# Patient Record
Sex: Female | Born: 1945 | Race: White | Hispanic: No | Marital: Married | Smoking: Former smoker
Health system: Southern US, Community
[De-identification: ages and names within clinical notes are randomized; demographics above are authoritative.]

## PROBLEM LIST (undated history)

## (undated) DIAGNOSIS — K589 Irritable bowel syndrome without diarrhea: Secondary | ICD-10-CM

## (undated) DIAGNOSIS — I2510 Atherosclerotic heart disease of native coronary artery without angina pectoris: Secondary | ICD-10-CM

## (undated) DIAGNOSIS — I5189 Other ill-defined heart diseases: Secondary | ICD-10-CM

## (undated) DIAGNOSIS — I10 Essential (primary) hypertension: Secondary | ICD-10-CM

## (undated) DIAGNOSIS — I214 Non-ST elevation (NSTEMI) myocardial infarction: Secondary | ICD-10-CM

## (undated) DIAGNOSIS — I2699 Other pulmonary embolism without acute cor pulmonale: Secondary | ICD-10-CM

## (undated) DIAGNOSIS — I519 Heart disease, unspecified: Secondary | ICD-10-CM

## (undated) DIAGNOSIS — I5181 Takotsubo syndrome: Secondary | ICD-10-CM

## (undated) DIAGNOSIS — M797 Fibromyalgia: Secondary | ICD-10-CM

## (undated) DIAGNOSIS — K5792 Diverticulitis of intestine, part unspecified, without perforation or abscess without bleeding: Secondary | ICD-10-CM

## (undated) DIAGNOSIS — E785 Hyperlipidemia, unspecified: Secondary | ICD-10-CM

## (undated) HISTORY — DX: Atherosclerotic heart disease of native coronary artery without angina pectoris: I25.10

## (undated) HISTORY — DX: Takotsubo syndrome: I51.81

## (undated) HISTORY — PX: STOMACH SURGERY: SHX791

## (undated) HISTORY — DX: Hyperlipidemia, unspecified: E78.5

## (undated) HISTORY — PX: ABDOMINAL HYSTERECTOMY: SHX81

---

## 1998-01-23 ENCOUNTER — Emergency Department (HOSPITAL_COMMUNITY): Admission: EM | Admit: 1998-01-23 | Discharge: 1998-01-23 | Payer: Self-pay | Admitting: Emergency Medicine

## 1998-07-29 ENCOUNTER — Emergency Department (HOSPITAL_COMMUNITY): Admission: EM | Admit: 1998-07-29 | Discharge: 1998-07-29 | Payer: Self-pay | Admitting: Emergency Medicine

## 1998-08-02 ENCOUNTER — Emergency Department (HOSPITAL_COMMUNITY): Admission: EM | Admit: 1998-08-02 | Discharge: 1998-08-02 | Payer: Self-pay | Admitting: Emergency Medicine

## 1998-09-28 ENCOUNTER — Emergency Department (HOSPITAL_COMMUNITY): Admission: EM | Admit: 1998-09-28 | Discharge: 1998-09-29 | Payer: Self-pay | Admitting: Emergency Medicine

## 1998-09-28 ENCOUNTER — Encounter: Payer: Self-pay | Admitting: Emergency Medicine

## 1998-09-29 ENCOUNTER — Encounter: Payer: Self-pay | Admitting: Emergency Medicine

## 1999-04-16 ENCOUNTER — Ambulatory Visit (HOSPITAL_COMMUNITY): Admission: RE | Admit: 1999-04-16 | Discharge: 1999-04-16 | Payer: Self-pay | Admitting: Emergency Medicine

## 1999-05-14 ENCOUNTER — Emergency Department (HOSPITAL_COMMUNITY): Admission: EM | Admit: 1999-05-14 | Discharge: 1999-05-14 | Payer: Self-pay | Admitting: Emergency Medicine

## 1999-05-14 ENCOUNTER — Encounter: Payer: Self-pay | Admitting: Emergency Medicine

## 1999-07-16 ENCOUNTER — Ambulatory Visit (HOSPITAL_COMMUNITY): Admission: RE | Admit: 1999-07-16 | Discharge: 1999-07-16 | Payer: Self-pay | Admitting: Gastroenterology

## 1999-07-16 ENCOUNTER — Encounter (INDEPENDENT_AMBULATORY_CARE_PROVIDER_SITE_OTHER): Payer: Self-pay | Admitting: Specialist

## 1999-07-17 ENCOUNTER — Encounter: Payer: Self-pay | Admitting: Gastroenterology

## 1999-07-17 ENCOUNTER — Ambulatory Visit (HOSPITAL_COMMUNITY): Admission: RE | Admit: 1999-07-17 | Discharge: 1999-07-17 | Payer: Self-pay | Admitting: Gastroenterology

## 2000-05-02 ENCOUNTER — Ambulatory Visit (HOSPITAL_COMMUNITY): Admission: RE | Admit: 2000-05-02 | Discharge: 2000-05-02 | Payer: Self-pay | Admitting: *Deleted

## 2000-09-02 ENCOUNTER — Ambulatory Visit (HOSPITAL_COMMUNITY): Admission: RE | Admit: 2000-09-02 | Discharge: 2000-09-02 | Payer: Self-pay | Admitting: Specialist

## 2001-02-07 ENCOUNTER — Emergency Department (HOSPITAL_COMMUNITY): Admission: EM | Admit: 2001-02-07 | Discharge: 2001-02-07 | Payer: Self-pay | Admitting: Emergency Medicine

## 2001-02-10 ENCOUNTER — Ambulatory Visit (HOSPITAL_COMMUNITY): Admission: RE | Admit: 2001-02-10 | Discharge: 2001-02-10 | Payer: Self-pay | Admitting: Emergency Medicine

## 2001-02-10 ENCOUNTER — Encounter: Payer: Self-pay | Admitting: Emergency Medicine

## 2001-02-14 ENCOUNTER — Emergency Department (HOSPITAL_COMMUNITY): Admission: EM | Admit: 2001-02-14 | Discharge: 2001-02-14 | Payer: Self-pay | Admitting: Emergency Medicine

## 2001-05-24 ENCOUNTER — Emergency Department (HOSPITAL_COMMUNITY): Admission: EM | Admit: 2001-05-24 | Discharge: 2001-05-24 | Payer: Self-pay | Admitting: Emergency Medicine

## 2001-08-19 ENCOUNTER — Encounter: Admission: RE | Admit: 2001-08-19 | Discharge: 2001-08-19 | Payer: Self-pay | Admitting: *Deleted

## 2001-09-08 ENCOUNTER — Encounter: Payer: Self-pay | Admitting: Emergency Medicine

## 2001-09-08 ENCOUNTER — Emergency Department (HOSPITAL_COMMUNITY): Admission: EM | Admit: 2001-09-08 | Discharge: 2001-09-09 | Payer: Self-pay | Admitting: Emergency Medicine

## 2001-09-09 ENCOUNTER — Encounter: Payer: Self-pay | Admitting: Emergency Medicine

## 2002-06-07 ENCOUNTER — Encounter: Admission: RE | Admit: 2002-06-07 | Discharge: 2002-06-07 | Payer: Self-pay | Admitting: *Deleted

## 2003-04-23 ENCOUNTER — Emergency Department (HOSPITAL_COMMUNITY): Admission: EM | Admit: 2003-04-23 | Discharge: 2003-04-23 | Payer: Self-pay | Admitting: Emergency Medicine

## 2003-04-23 ENCOUNTER — Encounter: Payer: Self-pay | Admitting: Emergency Medicine

## 2004-09-25 ENCOUNTER — Ambulatory Visit: Payer: Self-pay | Admitting: Internal Medicine

## 2004-12-03 ENCOUNTER — Ambulatory Visit: Payer: Self-pay | Admitting: Internal Medicine

## 2005-01-03 ENCOUNTER — Ambulatory Visit: Payer: Self-pay | Admitting: Internal Medicine

## 2005-01-21 ENCOUNTER — Ambulatory Visit: Payer: Self-pay | Admitting: Internal Medicine

## 2005-01-28 ENCOUNTER — Ambulatory Visit: Payer: Self-pay

## 2005-04-01 ENCOUNTER — Ambulatory Visit: Payer: Self-pay | Admitting: Internal Medicine

## 2005-06-17 ENCOUNTER — Ambulatory Visit: Payer: Self-pay | Admitting: Internal Medicine

## 2005-07-05 ENCOUNTER — Emergency Department (HOSPITAL_COMMUNITY): Admission: EM | Admit: 2005-07-05 | Discharge: 2005-07-05 | Payer: Self-pay | Admitting: Emergency Medicine

## 2005-07-15 ENCOUNTER — Ambulatory Visit: Payer: Self-pay | Admitting: Internal Medicine

## 2005-07-16 ENCOUNTER — Encounter: Admission: RE | Admit: 2005-07-16 | Discharge: 2005-07-16 | Payer: Self-pay | Admitting: Internal Medicine

## 2005-09-16 ENCOUNTER — Ambulatory Visit: Payer: Self-pay | Admitting: Internal Medicine

## 2005-11-04 ENCOUNTER — Ambulatory Visit: Payer: Self-pay | Admitting: Internal Medicine

## 2005-12-16 ENCOUNTER — Emergency Department (HOSPITAL_COMMUNITY): Admission: EM | Admit: 2005-12-16 | Discharge: 2005-12-16 | Payer: Self-pay | Admitting: Emergency Medicine

## 2005-12-30 ENCOUNTER — Ambulatory Visit: Payer: Self-pay | Admitting: Internal Medicine

## 2006-09-17 ENCOUNTER — Ambulatory Visit (HOSPITAL_COMMUNITY): Admission: RE | Admit: 2006-09-17 | Discharge: 2006-09-17 | Payer: Self-pay | Admitting: Gastroenterology

## 2007-02-03 ENCOUNTER — Encounter: Admission: RE | Admit: 2007-02-03 | Discharge: 2007-02-03 | Payer: Self-pay | Admitting: Gastroenterology

## 2007-05-04 ENCOUNTER — Observation Stay (HOSPITAL_COMMUNITY): Admission: EM | Admit: 2007-05-04 | Discharge: 2007-05-05 | Payer: Self-pay | Admitting: Emergency Medicine

## 2007-05-04 ENCOUNTER — Ambulatory Visit: Payer: Self-pay | Admitting: Internal Medicine

## 2007-10-20 ENCOUNTER — Ambulatory Visit (HOSPITAL_COMMUNITY): Admission: RE | Admit: 2007-10-20 | Discharge: 2007-10-20 | Payer: Self-pay | Admitting: Gastroenterology

## 2007-12-15 ENCOUNTER — Ambulatory Visit (HOSPITAL_COMMUNITY): Admission: RE | Admit: 2007-12-15 | Discharge: 2007-12-15 | Payer: Self-pay | Admitting: Gastroenterology

## 2007-12-23 ENCOUNTER — Ambulatory Visit (HOSPITAL_COMMUNITY): Admission: RE | Admit: 2007-12-23 | Discharge: 2007-12-23 | Payer: Self-pay | Admitting: Gastroenterology

## 2008-02-29 ENCOUNTER — Ambulatory Visit (HOSPITAL_COMMUNITY): Admission: RE | Admit: 2008-02-29 | Discharge: 2008-02-29 | Payer: Self-pay | Admitting: Gastroenterology

## 2008-03-09 ENCOUNTER — Ambulatory Visit (HOSPITAL_COMMUNITY): Admission: RE | Admit: 2008-03-09 | Discharge: 2008-03-09 | Payer: Self-pay | Admitting: Gastroenterology

## 2008-03-28 ENCOUNTER — Ambulatory Visit (HOSPITAL_COMMUNITY): Admission: RE | Admit: 2008-03-28 | Discharge: 2008-03-28 | Payer: Self-pay | Admitting: Gastroenterology

## 2008-07-19 ENCOUNTER — Encounter (INDEPENDENT_AMBULATORY_CARE_PROVIDER_SITE_OTHER): Payer: Self-pay | Admitting: General Surgery

## 2008-07-19 ENCOUNTER — Inpatient Hospital Stay (HOSPITAL_COMMUNITY): Admission: RE | Admit: 2008-07-19 | Discharge: 2008-07-24 | Payer: Self-pay | Admitting: General Surgery

## 2009-03-29 ENCOUNTER — Emergency Department (HOSPITAL_COMMUNITY): Admission: EM | Admit: 2009-03-29 | Discharge: 2009-03-29 | Payer: Self-pay | Admitting: Emergency Medicine

## 2009-04-24 ENCOUNTER — Inpatient Hospital Stay (HOSPITAL_COMMUNITY): Admission: EM | Admit: 2009-04-24 | Discharge: 2009-04-27 | Payer: Self-pay | Admitting: Emergency Medicine

## 2009-09-01 IMAGING — CR DG ABDOMEN ACUTE W/ 1V CHEST
3 series · 3 of 3 positions shown · non-contrast
Comparison: none

CLINICAL DATA: Abdominal pain. 
 ACUTE ABDOMINAL SERIES WITH CHEST:

[w chest pa]
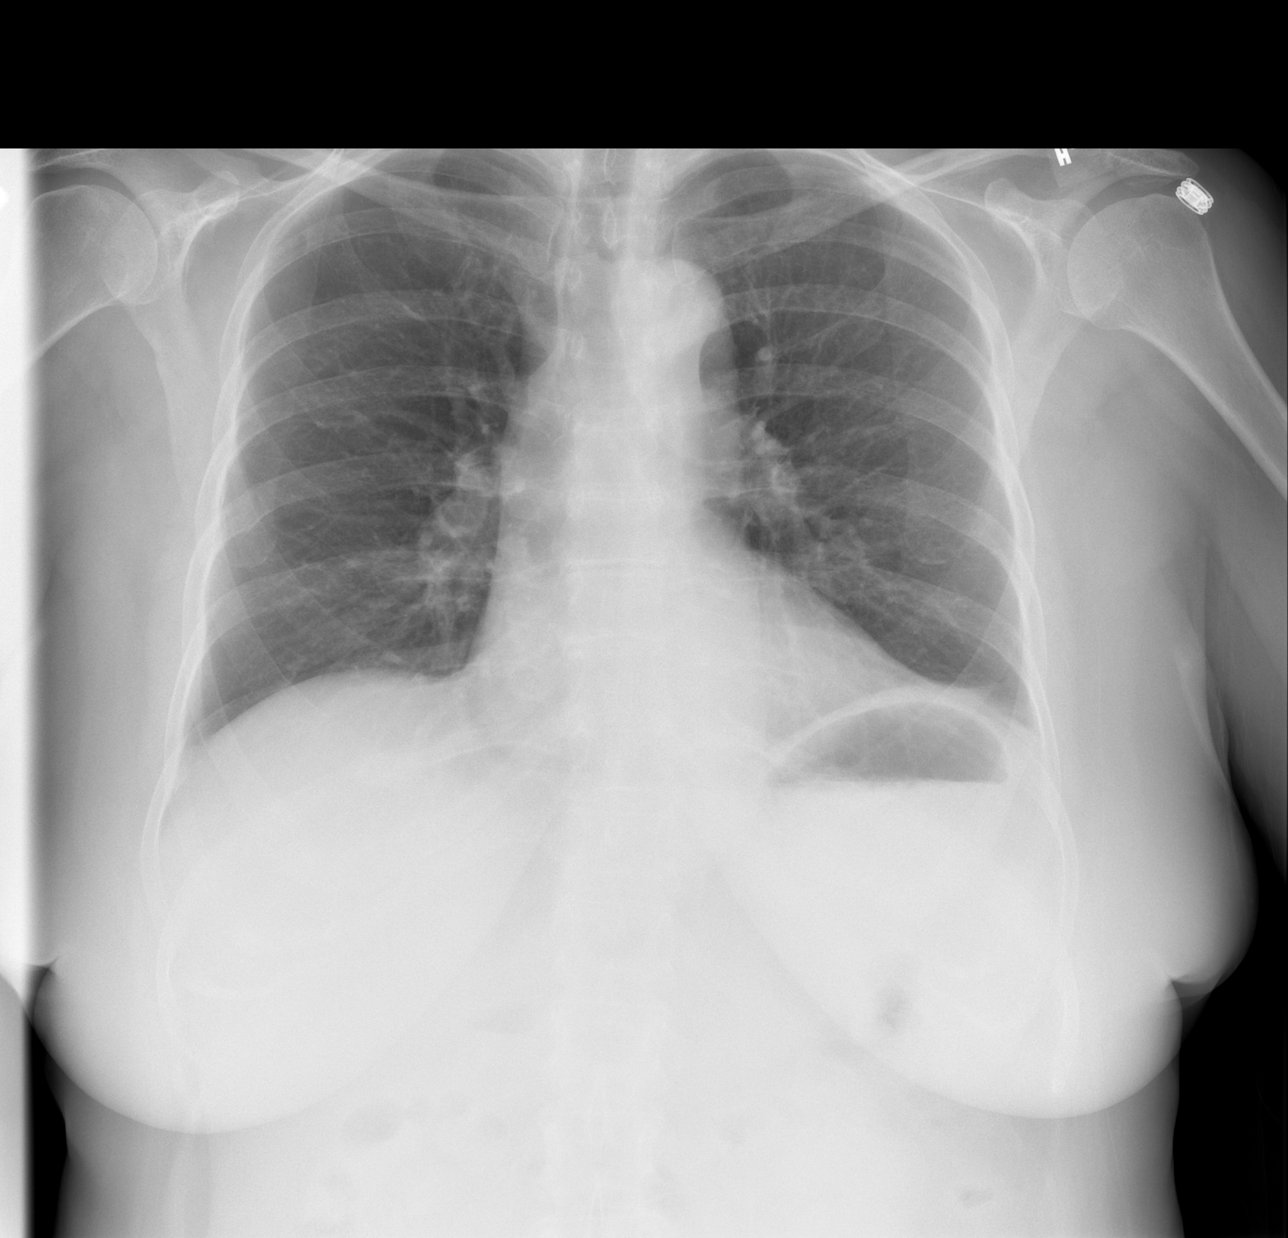

[w abdomen upright]
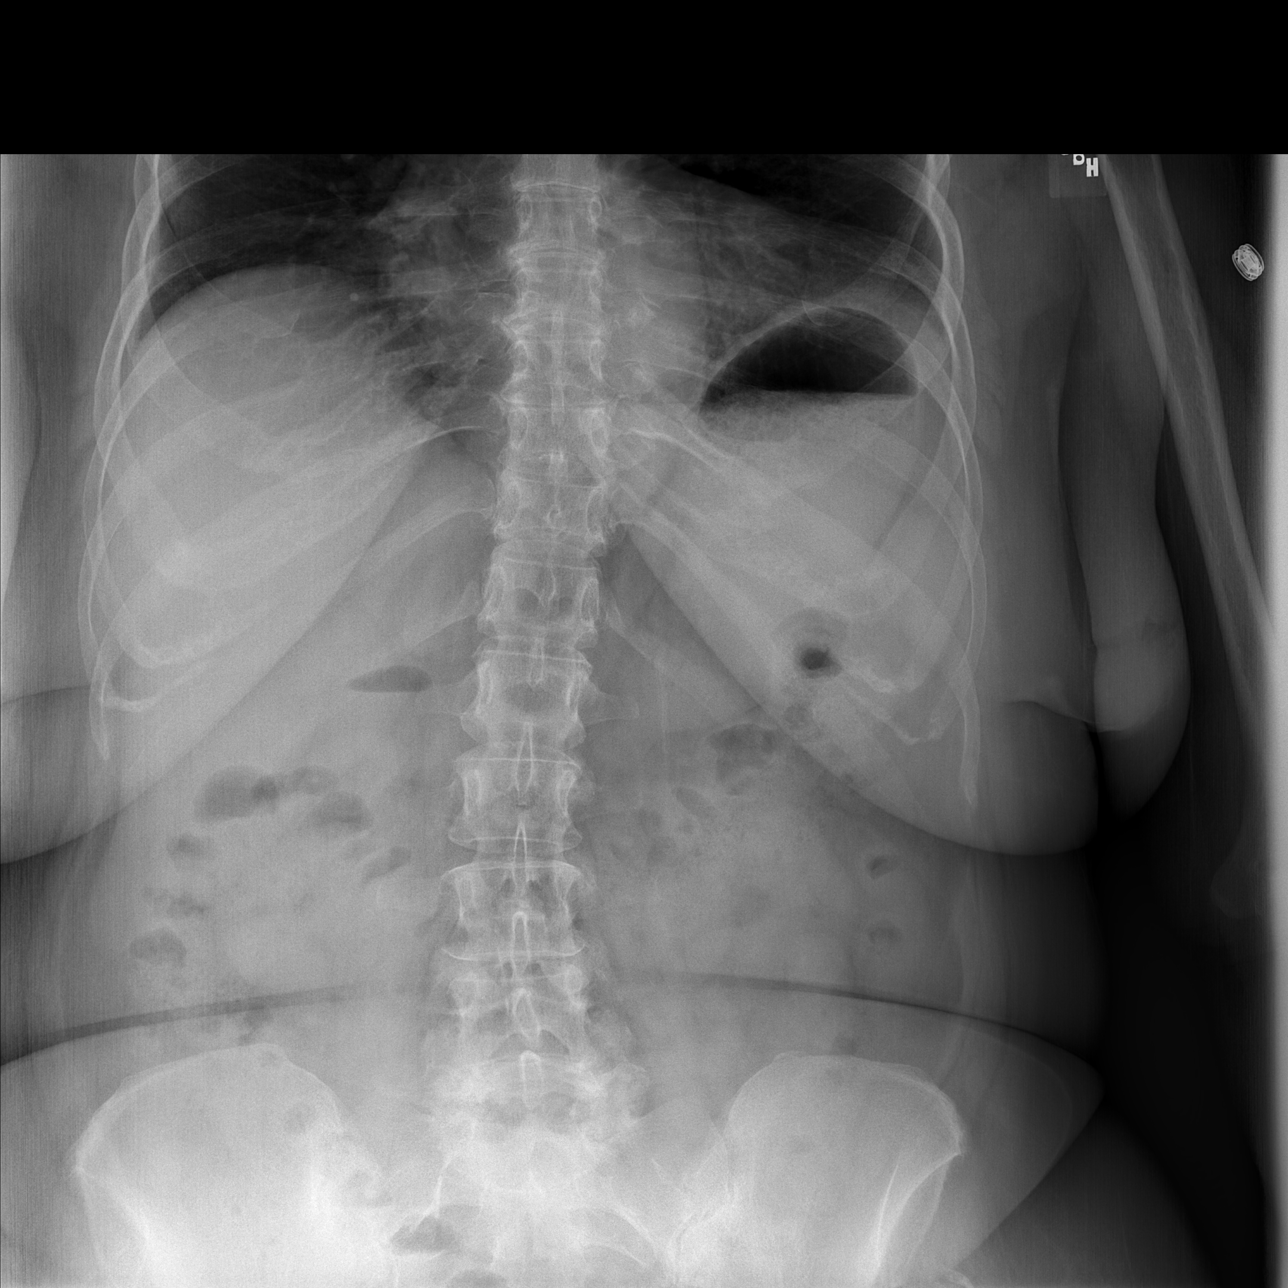

[t abdomen supine]
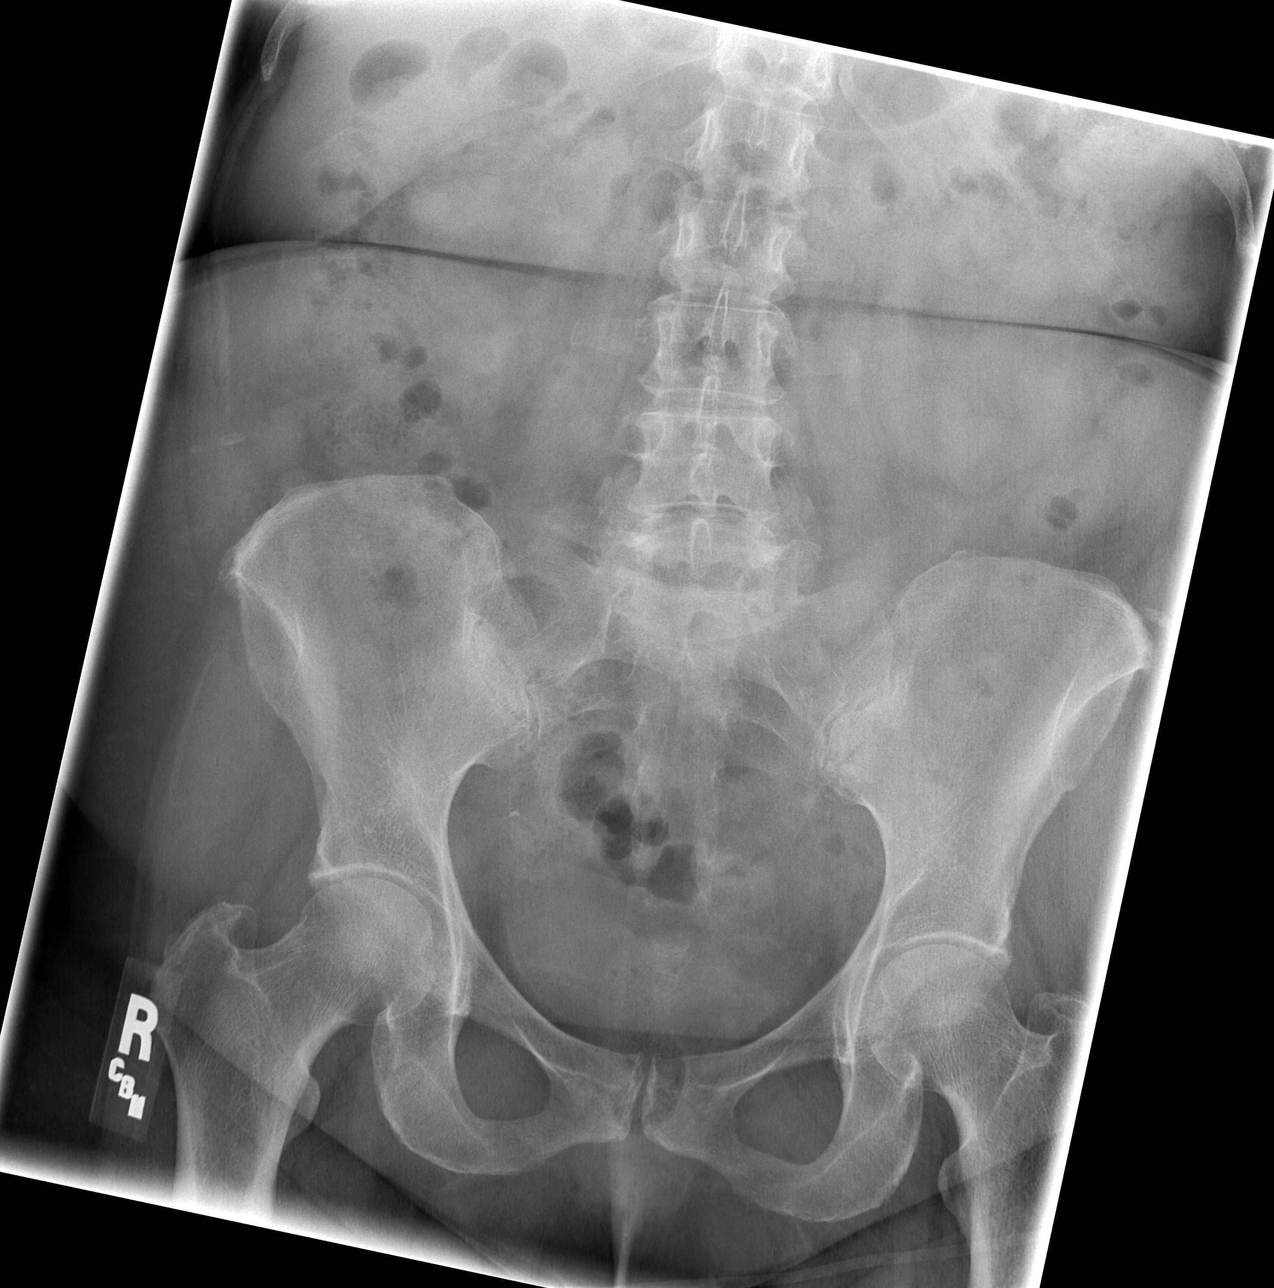

[3 of 3 positions shown; findings below may reference images not displayed]

FINDINGS: Normal mediastinum and cardiac silhouette.  There is blunting of the left costophrenic angle and mild atelectasis.  Normal pulmonary vasculature.  No evidence of consolidation or pneumothorax.  Supine and upright views of the abdomen demonstrate a normal bowel gas pattern.  No evidence of intraperitoneal free air.  No pathologic calcifications.
IMPRESSION: 1.  Left lower lobe atelectasis and small effusion.  
 2.  No evidence of bowel obstruction or intraperitoneal free air.

## 2010-10-03 LAB — BASIC METABOLIC PANEL
BUN: 3 mg/dL — ABNORMAL LOW (ref 6–23)
BUN: 3 mg/dL — ABNORMAL LOW (ref 6–23)
BUN: 4 mg/dL — ABNORMAL LOW (ref 6–23)
BUN: 4 mg/dL — ABNORMAL LOW (ref 6–23)
BUN: 5 mg/dL — ABNORMAL LOW (ref 6–23)
BUN: 7 mg/dL (ref 6–23)
BUN: 9 mg/dL (ref 6–23)
CO2: 25 mEq/L (ref 19–32)
CO2: 27 mEq/L (ref 19–32)
CO2: 27 mEq/L (ref 19–32)
Calcium: 8.3 mg/dL — ABNORMAL LOW (ref 8.4–10.5)
Calcium: 8.7 mg/dL (ref 8.4–10.5)
Calcium: 8.7 mg/dL (ref 8.4–10.5)
Calcium: 8.9 mg/dL (ref 8.4–10.5)
Calcium: 9.1 mg/dL (ref 8.4–10.5)
Chloride: 100 mEq/L (ref 96–112)
Chloride: 103 mEq/L (ref 96–112)
Chloride: 105 mEq/L (ref 96–112)
Chloride: 80 mEq/L — ABNORMAL LOW (ref 96–112)
Chloride: 88 mEq/L — ABNORMAL LOW (ref 96–112)
Creatinine, Ser: 0.58 mg/dL (ref 0.4–1.2)
Creatinine, Ser: 0.59 mg/dL (ref 0.4–1.2)
Creatinine, Ser: 0.62 mg/dL (ref 0.4–1.2)
Creatinine, Ser: 0.63 mg/dL (ref 0.4–1.2)
Creatinine, Ser: 0.71 mg/dL (ref 0.4–1.2)
Creatinine, Ser: 0.72 mg/dL (ref 0.4–1.2)
GFR calc Af Amer: >60 mL/min (ref >60)
GFR calc Af Amer: >60 mL/min (ref >60)
GFR calc non Af Amer: >60 mL/min (ref >60)
GFR calc non Af Amer: >60 mL/min (ref >60)
GFR calc non Af Amer: >60 mL/min (ref >60)
GFR calc non Af Amer: >60 mL/min (ref >60)
GFR calc non Af Amer: >60 mL/min (ref >60)
Glucose, Bld: 110 mg/dL — ABNORMAL HIGH (ref 70–99)
Glucose, Bld: 165 mg/dL — ABNORMAL HIGH (ref 70–99)
Glucose, Bld: 79 mg/dL (ref 70–99)
Glucose, Bld: 87 mg/dL (ref 70–99)
Glucose, Bld: 94 mg/dL (ref 70–99)
Potassium: 2.2 mEq/L — CL (ref 3.5–5.1)
Potassium: 2.9 mEq/L — ABNORMAL LOW (ref 3.5–5.1)
Potassium: 3.8 mEq/L (ref 3.5–5.1)
Potassium: 4 mEq/L (ref 3.5–5.1)
Potassium: 4.4 mEq/L (ref 3.5–5.1)
Sodium: 120 mEq/L — ABNORMAL LOW (ref 135–145)
Sodium: 124 mEq/L — ABNORMAL LOW (ref 135–145)
Sodium: 136 mEq/L (ref 135–145)

## 2010-10-03 LAB — COMPREHENSIVE METABOLIC PANEL
ALT: 22 U/L (ref 0–35)
Albumin: 3.7 g/dL (ref 3.5–5.2)
CO2: 29 mEq/L (ref 19–32)
Chloride: 73 mEq/L — CL (ref 96–112)
Creatinine, Ser: 0.63 mg/dL (ref 0.4–1.2)
Sodium: 116 mEq/L — CL (ref 135–145)
Total Bilirubin: 0.8 mg/dL (ref 0.3–1.2)

## 2010-10-03 LAB — URINE CULTURE

## 2010-10-03 LAB — RETICULOCYTES
RBC.: 4.55 MIL/uL (ref 3.87–5.11)
Retic Ct Pct: 1.8 % (ref 0.4–3.1)

## 2010-10-03 LAB — FOLATE: Folate: >20 ng/mL

## 2010-10-03 LAB — CBC
HCT: 28.9 % — ABNORMAL LOW (ref 36.0–46.0)
MCHC: 33.3 g/dL (ref 30.0–36.0)
MCHC: 33.5 g/dL (ref 30.0–36.0)
MCV: 76.4 fL — ABNORMAL LOW (ref 78.0–100.0)
MCV: 77.8 fL — ABNORMAL LOW (ref 78.0–100.0)
Platelets: 308 10*3/uL (ref 150–400)
Platelets: 375 10*3/uL (ref 150–400)
RBC: 3.71 MIL/uL — ABNORMAL LOW (ref 3.87–5.11)
RDW: 17.9 % — ABNORMAL HIGH (ref 11.5–15.5)
WBC: 8.5 10*3/uL (ref 4.0–10.5)

## 2010-10-03 LAB — DIFFERENTIAL
Basophils Relative: 0 % (ref 0–1)
Eosinophils Absolute: 0.3 10*3/uL (ref 0.0–0.7)
Eosinophils Relative: 4 % (ref 0–5)
Lymphocytes Relative: 36 % (ref 12–46)
Lymphs Abs: 2 10*3/uL (ref 0.7–4.0)
Lymphs Abs: 3 10*3/uL (ref 0.7–4.0)
Monocytes Absolute: 0.5 10*3/uL (ref 0.1–1.0)
Monocytes Relative: 10 % (ref 3–12)
Neutro Abs: 9.6 10*3/uL — ABNORMAL HIGH (ref 1.7–7.7)
Neutrophils Relative %: 50 % (ref 43–77)

## 2010-10-03 LAB — URINALYSIS, ROUTINE W REFLEX MICROSCOPIC
Glucose, UA: NEGATIVE mg/dL
Ketones, ur: NEGATIVE mg/dL
Leukocytes, UA: NEGATIVE
Nitrite: NEGATIVE
Protein, ur: NEGATIVE mg/dL
Specific Gravity, Urine: 1.011 (ref 1.005–1.030)
Urobilinogen, UA: 0.2 mg/dL (ref 0.0–1.0)
pH: 7 (ref 5.0–8.0)

## 2010-10-03 LAB — URINE MICROSCOPIC-ADD ON

## 2010-10-03 LAB — CARDIAC PANEL(CRET KIN+CKTOT+MB+TROPI)
CK, MB: 4.5 ng/mL — ABNORMAL HIGH (ref 0.3–4.0)
CK, MB: 7.3 ng/mL — ABNORMAL HIGH (ref 0.3–4.0)
Relative Index: 1.7 (ref 0.0–2.5)
Relative Index: 2 (ref 0.0–2.5)
Troponin I: 0.01 ng/mL (ref 0.00–0.06)
Troponin I: 0.01 ng/mL (ref 0.00–0.06)

## 2010-10-03 LAB — IRON AND TIBC: UIBC: 397 ug/dL

## 2010-10-03 LAB — TROPONIN I: Troponin I: 0.01 ng/mL (ref 0.00–0.06)

## 2010-10-03 LAB — MAGNESIUM
Magnesium: 2.1 mg/dL (ref 1.5–2.5)
Magnesium: 2.2 mg/dL (ref 1.5–2.5)

## 2010-10-03 LAB — VITAMIN B12: Vitamin B-12: 1167 pg/mL — ABNORMAL HIGH (ref 211–911)

## 2010-10-03 LAB — CK TOTAL AND CKMB (NOT AT ARMC)
CK, MB: 11.3 ng/mL — ABNORMAL HIGH (ref 0.3–4.0)
Relative Index: 1.5 (ref 0.0–2.5)

## 2010-10-14 LAB — COMPREHENSIVE METABOLIC PANEL
ALT: 19 U/L (ref 0–35)
AST: 27 U/L (ref 0–37)
Albumin: 3.6 g/dL (ref 3.5–5.2)
Alkaline Phosphatase: 102 U/L (ref 39–117)
Chloride: 99 mEq/L (ref 96–112)
Potassium: 4 mEq/L (ref 3.5–5.1)
Sodium: 138 mEq/L (ref 135–145)
Total Bilirubin: 0.3 mg/dL (ref 0.3–1.2)
Total Protein: 7.5 g/dL (ref 6.0–8.3)

## 2010-10-14 LAB — BASIC METABOLIC PANEL
CO2: 27 mEq/L (ref 19–32)
CO2: 28 mEq/L (ref 19–32)
Calcium: 8.4 mg/dL (ref 8.4–10.5)
Calcium: 8.7 mg/dL (ref 8.4–10.5)
Chloride: 103 mEq/L (ref 96–112)
Chloride: 106 mEq/L (ref 96–112)
Creatinine, Ser: 0.57 mg/dL (ref 0.4–1.2)
Creatinine, Ser: 0.57 mg/dL (ref 0.4–1.2)
GFR calc non Af Amer: >60 mL/min (ref >60)
Glucose, Bld: 118 mg/dL — ABNORMAL HIGH (ref 70–99)
Potassium: 3.9 mEq/L (ref 3.5–5.1)

## 2010-10-14 LAB — CBC
HCT: 33.1 % — ABNORMAL LOW (ref 36.0–46.0)
HCT: 37.2 % (ref 36.0–46.0)
Hemoglobin: 10.8 g/dL — ABNORMAL LOW (ref 12.0–15.0)
MCHC: 32.4 g/dL (ref 30.0–36.0)
MCHC: 32.7 g/dL (ref 30.0–36.0)
MCV: 81.5 fL (ref 78.0–100.0)
MCV: 81.6 fL (ref 78.0–100.0)
Platelets: 312 10*3/uL (ref 150–400)
RBC: 4.05 MIL/uL (ref 3.87–5.11)
RBC: 4.06 MIL/uL (ref 3.87–5.11)
RDW: 17.4 % — ABNORMAL HIGH (ref 11.5–15.5)
RDW: 17.5 % — ABNORMAL HIGH (ref 11.5–15.5)
WBC: 10.7 10*3/uL — ABNORMAL HIGH (ref 4.0–10.5)
WBC: 7.6 10*3/uL (ref 4.0–10.5)

## 2010-10-14 LAB — DIFFERENTIAL
Basophils Absolute: 0 10*3/uL (ref 0.0–0.1)
Basophils Relative: 1 % (ref 0–1)
Eosinophils Absolute: 0.3 10*3/uL (ref 0.0–0.7)
Eosinophils Relative: 4 % (ref 0–5)
Monocytes Absolute: 0.6 10*3/uL (ref 0.1–1.0)
Monocytes Relative: 9 % (ref 3–12)

## 2010-10-14 LAB — HEMOGLOBIN AND HEMATOCRIT, BLOOD: HCT: 33 % — ABNORMAL LOW (ref 36.0–46.0)

## 2010-10-14 LAB — URINALYSIS, ROUTINE W REFLEX MICROSCOPIC
Bilirubin Urine: NEGATIVE
Hgb urine dipstick: NEGATIVE
Ketones, ur: NEGATIVE mg/dL
Protein, ur: NEGATIVE mg/dL
Specific Gravity, Urine: 1.026 (ref 1.005–1.030)
Urobilinogen, UA: 0.2 mg/dL (ref 0.0–1.0)

## 2010-11-12 NOTE — Op Note (Signed)
NAMEAUSTRALIA, DROLL                 ACCOUNT NO.:  [id number]   MEDICAL RECORD NO.:  [id number]          PATIENT TYPE:  AMB   LOCATION:  ENDO                         FACILITY:  [facility]   PHYSICIAN:  John C. Madilyn Fireman, M.D.    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 Procedure was done today one  more attempt with the fluoroscopic guidance in hopes of achieving  effective dilatation.   PROCEDURE:  The patient was placed in the left lateral decubitus  position and placed on the pulse monitor with continuous low-flow oxygen  delivered by nasal cannula.  She was sedated with 100 mcg of IV  fentanyl, 14 mg of IV Versed, and 25 mg of IV Phenergan.  The Olympus  video endoscope was advanced under direct vision into the oropharynx and  esophagus.  The esophagus was straight and of normal caliber, the  squamocolumnar line at 38 cm.  The stomach was entered and there was a  small to moderate amount of retained food in the dependent portions of  fundus, otherwise the fundus, body, and antrum appeared normal.  The  pylorus appeared somewhat fixed and open and had a rim of exudate in the  central lumen as on previous occasions.  The duodenal bulb was dilated  and at the distal bulb, there was a pinpoint narrowing with a very small  opening, I could not pass the scope through.  I was successful in  passing a guidewire through the 10-12 mm pyloric balloon catheter and  then I was able to position the balloon to inflate it to 10, 11, and  then 12 mm for 15-second intervals and it was then deflated.  This was  then exchanged for 12-15 mm balloon catheter and was inflated in a  similar fashion with maximum diameter of 13.5 for 15 seconds.  There was  some self-limited bleeding when this was done, but otherwise appeared to  be no complications.  The scope was then withdrawn and the patient  returned to the recovery room in stable condition.  She tolerated the  procedure well.  There were no immediate complications.   IMPRESSION:  Persistent stenosis junction of duodenal bulb and second  portion status post dilatation to 13.5 mm.   PLAN:  Advance diet and observe response to dilatation.  If she remains  significantly symptomatic, I think she will probably need the surgical  bypass procedure.           ______________________________  Everardo All. Madilyn Fireman, M.D.     JCH/MEDQ  D:  03/28/2008  T:  03/29/2008  Job:  [id number]

## 2010-11-12 NOTE — Discharge Summary (Signed)
[name], [name] NO.:  [id number]   MEDICAL RECORD NO.:  [id number]          PATIENT TYPE:  OBV   LOCATION:  5707                         FACILITY:  [facility]   PHYSICIAN:  [name], M.D.DATE OF BIRTH:  12/12/1945   DATE OF ADMISSION:  05/04/2007  DATE OF DISCHARGE:  05/05/2007                               DISCHARGE SUMMARY   PRIMARY CARE [name]:  [name], however patient sees [name] of  [facility] for her irritable bowel syndrome.   REASON FOR ADMISSION:  Abdominal pain and nausea.   DISCHARGE DIAGNOSES:  1. Irritable bowel syndrome.  2. Peptic ulcer disease per patient's report.  3. Insomnia.  4. Anxiety.   DISCHARGE MEDICATIONS:  1. Nexium as previously prescribed.  2. Bentyl 20 mg p.o. q.i.d.  3. Ambien as previously prescribed.  4. Patient gives history of peptic ulcer disease and also reports      overuse of NSAIDs.  Therefore, she has been instructed to stop      taking ibuprofen and Goody Powders.   HOSPITAL COURSE:  Patient is a 65 year old female with a history of  anxiety, IBS, insomnia, who presents with worsening of abdominal pain.  She also complained of decreased oral intake secondary to nausea as well  as bloating and diarrhea.   1. IBS:  Upon evaluation, patient's symptoms seemed most consistent      with irritable bowel syndrome and not from additional or new GI      process.  She had a CT scan performed which showed diverticulitis,      however patient's physical exam and lack of fever were inconsistent      with this diagnosis.  She was briefly started on treatment for      diverticulitis, however this was discontinued prior to discharge.      Patient has been on Bentyl as an outpatient, however she was taking      it p.r.n. as opposed to scheduled.  Therefore, she was educated      prior to discharge that she should be taking Bentyl routinely.  On      day of discharge, patient was tolerating p.o. well  and had      decreased abdominal pain.  She has scheduled a followup appointment      with [name] of [facility] on November 11.  2. Acute respiratory depression:  On evening of admission, patient      eventually was found to have a respiratory rate of 4 with      hypersomnolence.  She received 6 mg of Dilaudid in the emergency      department as well as Phenergan 25 mg.  Rapid response was called      as well as the family practice resident on call.  Patient had      immediate reversal of her symptoms with administration of Norocaine      and was stable throughout the remainder of her hospitalization.  3. Dehydration:  Patient did appear clinically dehydrated on      admission.  She was treated with  IV fluids at 100 ml per hour until      she was able to tolerate p.o.  She was able to tolerate p.o. prior      to discharge.   CONDITION AT THE TIME OF DISCHARGE:  Stable.   DISPOSITION:  Patient is discharged to home.   FOLLOWUP:  Patient is to follow up with [name] at [facility] on November 11 at 10:00 a.m.   DISCHARGE LABS:  White blood cell count 10.9, hemoglobin 14, sodium 141,  potassium 4.1, BUN 11, creatinine 0.64, AST 30, ALT 23, lipase 17.  Urinalysis within normal limits.      [name] Franklin, MD  Electronically Signed      [name], M.D.  Electronically Signed    TCB/MEDQ  D:  05/05/2007  T:  05/05/2007  Job:  [id number]   cc:   [name]. Madilyn Colon, M.D.

## 2010-11-12 NOTE — Op Note (Signed)
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   SURGICAL PROCEDURES:  Laparoscopic truncal vagotomy, hiatal hernia  repair, gastrojejunostomy and Stamm gastrostomy.   SURGEON:  [name]. Colon, M.D.   ASSISTANT:  Dr. Luretha Colon.   ANESTHESIA:  General.   BRIEF HISTORY:  [name] is a 65 year old female followed by [name] with a long history of gradually worsening postprandial nausea and  vomiting of undigested food, upper abdominal pain and distention along  with worsening reflux and belching.  She has had a workup including two  separate upper GI endoscopies showing an apparent inflammatory stricture  in the second portion of the duodenum with a tight stricture seen.  She  had a gastric emptying study showing 85% retention at 2 hours with  extremely poor gastric emptying.  She has failed to respond to Reglan  and proton pump inhibitors.  At this constellation of findings after  extensive discussion in the office, we have elected to proceed with the  gastrojejunostomy in an attempt to relieve her gastric outlet  obstruction.  It has been a little confusing in that she has one upper  GI series showing no apparent duodenal stricture but this has been  confirmed twice on endoscopy.  The indications for the procedure, its  nature and the fact that we may not relieve her symptoms to the degree  that she has any gastroparesis have been discussed.  Risks of surgical  and anesthetic  complications, bleeding, infection have been discussed.  We will plan truncal vagotomy as well as this may be a peptic acid  problem and to try to reduce the risk of marginal ulcer postoperatively.  She is now brought to the operating room for this procedure.   DESCRIPTION OF OPERATION:  The patient brought to the operating room and  placed in supine position on the operating table and general orotracheal  anesthesia was induced.  She received preoperative IV antibiotics.  PAS  were in place.  The abdomen was widely sterilely prepped and draped.  Correct patient and procedure were verified.  Local anesthesia was used  to infiltrate the trocar sites.  Access was obtained in the left  subcostal space with 11 mm OptiVu trocar without difficulty and  pneumoperitoneum established.  There was no evidence of trocar injury.  Under direct vision an 11 mm trocar was placed laterally in the right  upper quadrant.  Another 11 mm trocar in the midclavicular line, right  upper quadrant, and another 11-mm trocar just above the left of the  umbilicus for the camera port.  Through a 5 mm epigastric port, the  Nathanson's retractor was placed and the left lobe of liver elevated  with excellent exposure of the stomach and hiatus.  Finally a 5 mm  trocar was placed in the left flank.  Initial attention was up turned  toward the vagotomy.  The pars flaccida was divided along an avascular  area and the right crus exposed.  The right crus was mobilized dividing  peritoneum along its anterior edge and the posterior esophagus and  retroesophageal space were dissected.  The posterior vagus nerve was  identified, was dissected free and then about a 1 cm segment resected  between clips.  Following this, the peritoneal incision was carried up  over the top of the esophagus of the hiatus and dissection just to the  left of the esophagus anteriorly revealed a large nerve trunk which was  dissected free consistent with  anterior vagus nerve and this was again  resected about a centimeter between clips.  Dissection along the  anterior border of the esophagus on the left side of the esophagus  revealed no other evidence of nerve fibers.  At this point we did note  the patient had a significant hiatal hernia several centimeters in  diameter.  Due to her reflux symptoms, we felt it may well be beneficial  for her to repair her hiatal hernia.  A 56 lighted bougie dilator was  placed orally into the stomach without difficulty to size the hiatus.  The crural repair was then performed posteriorly with two interrupted 0  Ethibond sutures which closed the hiatus down to about size of the 56  dilator.  Following this, attention was turned to the gastrojejunostomy.  The greater curve along the most dependent portion of the stomach was  identified and then the lesser omentum was dissected off of this with  the harmonic scalpel and entering the lesser sac and a good portion of  the greater curve completely mobilized into the lesser sac so that the  gastrojejunostomy could be placed in this most dependent the posterior  position.  We then went to bring a loop of proximal jejunum up.  There  were extensive omental adhesions into the pelvis that initially  prevented exposure of the small bowel so these were extensively lysed,  mobilizing the omentum out of the pelvis.  We were then able to lift the  omentum and transverse colon up and identify the ligament Treitz.  We  measured about 30 cm distally where there was a nice mobile loop that  came up to the stomach in the anticolic fashion without difficulty.  The  gastrojejunostomy was then constructed with initial running posterior  long row of 2-0 Vicryl.  Enterotomies were then made in the stomach and  the small intestine with harmonic scalpel.  A 60 mm blue load stapler  was then introduced at its full length and an anastomosis created.  The  staple line was inspected  and this was a nice long anastomosis.  The  staple line was without bleeding.  The enterotomy was then closed with a  running 2-0 Vicryl begun at either end and tied centrally.  Following  this, an anterior row of 2-0 Vicryl was placed inverting the entire  staple line and enterotomy closure.  This appeared widely patent under  no tension with excellent blood supply.  Due to possible delayed gastric  emptying,  I had recommended to the patient that a gastrostomy tube be  placed.  A 30 Foley catheter was introduced through a stab  wound in the  left upper quadrant.  Anterior gastrotomy was made up in the lateral  body of the stomach well above the anastomosis and the Foley introduced  and balloon inflated.  The pursestring suture of 2-0 Vicryl was then  placed around the catheter and secured.  The balloon and stomach were  then pulled up to the anterior abdominal wall which was then secured  from the stomach to the anterior abdominal wall with interrupted 2-0  Vicryl.  The abdomen was then again inspected for hemostasis and any  evidence of trocar injury.  Everything looked fine.  Tisseel tissue  sealant was used over the suture and staple lines of the  gastrojejunostomy.  Nathanson's retractor was removed under direct  vision and all CO2 evacuated and trocars removed.  Skin closed with  staples.  Sponge, needle and instrument counts were correct.  The  patient was taken to recovery in good condition.      [name]. Colon, M.D.  Electronically Signed     BTH/MEDQ  D:  07/19/2008  T:  07/19/2008  Job:  [id number]   cc:   Everardo All. Madilyn Fireman, M.D.  Fax: [phone]

## 2010-11-12 NOTE — Op Note (Signed)
NAMEJERYL, [name]                 ACCOUNT NO.:  [id number]   MEDICAL RECORD NO.:  [id number]          PATIENT TYPE:  AMB   LOCATION:  ENDO                         FACILITY:  [facility]   PHYSICIAN:  John C. Madilyn Fireman, M.D.    DATE OF BIRTH:  [birth year]   DATE OF PROCEDURE:  DATE OF DISCHARGE:                               OPERATIVE REPORT   PROCEDURE:  Esophagogastroduodenoscopy with pyloric dilatation.   INDICATION FOR PROCEDURE:  Delayed gastric emptying with previous  duodenal stricture on prior EGD.   PROCEDURE IN DETAIL:  The patient was placed in the left lateral  decubitus position and placed on the pulse monitor with continuous low-  flow oxygen delivered by nasal cannula.  She was sedated with 100 mcg IV  fentanyl, 10 mg IV Versed, and 25 mg IV Phenergan.  The Olympus video  endoscope was advanced under direct vision into the oropharynx and  esophagus.  The esophagus was straight of normal caliber with  squamocolumnar line at 38 cm.  There was moderate amount of retained  food and liquid in the stomach.  This was partly suctioned out with  particulate matter, for the most part I was then able to be suctioned  out.  There were no mucosal abnormalities seen in the visualized  portions of the stomach except the pylorus was somewhat fixed and open  and had a rim of superficial exudate, but no deep ulcer and it was  widely patent.  The duodenal bulb was dilated and narrowed to a pinhole  opening that would not admit passage of the scope through it, although I  could see some of the second portion beyond it, pyloric balloon dilators  were passed first of the 6, 7, and 8 size and inflated to each diameter  for 15 seconds each.  Then, this was replaced with in a similar 8, 9,  and 10-mm dilators and held open at each size for 15 seconds each.  After that, I was able to pass the scope through into the second portion  which appeared normal.  The narrowed area was bloody and I could not see  detailed relief of the mucosa at that level.  There was no visible  suspicion of malignancy; however, the scope was then withdrawn, and the  patient returned to the recovery room in stable condition.  She  tolerated the procedure well.  There were no immediate complications.   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   PLAN:  Continue high dose proton pump inhibitor, absolute avoidance of  nonsteroidal anti-inflammatory drugs.  We will discontinue Bentyl and we  will treat her constipation with MiraLax.  Follow up in the office in 2-  3 weeks.  If symptoms on approaching, may need a surgical  gastrojejunostomy.           ______________________________  Everardo All Madilyn Fireman, M.D.     JCH/MEDQ  D:  12/23/2007  T:  12/24/2007  Job:  [id number]

## 2010-11-12 NOTE — Op Note (Signed)
[name], [name]                 ACCOUNT NO.:  [id number]   MEDICAL RECORD NO.:  [id number]          PATIENT TYPE:  AMB   LOCATION:  ENDO                         [facility]ACILITY:  [facility]   PHYSICIAN:  John C. Madilyn Fireman, M.D.    DATE OF BIRTH:  08/15/1945   DATE OF PROCEDURE:  02/29/2008  DATE OF DISCHARGE:                               OPERATIVE REPORT   PROCEDURE PERFORMED:  Esophagogastroduodenoscopy.   ENDOSCOPIST:  Everardo All. Madilyn Fireman, M.D.   INDICATIONS FOR THE PROCEDURE:  Pyloric stenosis with delayed gastric  emptying with a previous successful balloon dilatation of  the pyloric  outlet to 12 mm with initial improvement, but subsequent relapse.   DESCRIPTION OF THE PROCEDURE:  The patient was placed in the left  lateral decubitus position and placed on the pulse monitor with  continuous low-flow oxygen delivered by nasal cannula.  She was sedated  with 100 mcg OF IV fentanyl and 10 mg of IV Versed, was also given 25 mg  of IV Phenergan.  The Olympus video endoscope was advanced under direct  vision into the oropharynx and into esophagus.  The esophagus was  straight and of normal caliber with the squamocolumnar line at 38 cm.   The stomach was entered and a small amount of liquid secretions were  suctioned from the fundus.  There was some bezoar material, but not a  lot in the dependent portions of the fundus; otherwise, the fundus, body  and antrum appeared normal.   The pylorus was fixed and open, and had a rim of exudate and surrounding  erythema as before.  It was patent and easily allowed passage of the  endoscope tip into the duodenum.  The bulb was dilated again and there  was a pinpoint opening as before; however, this time it seemed more  narrow and I could not get a 6-8 mm balloon catheter positioned into it.  I withdrew that catheter and used a wire guided catheter, but could not  thread a wire through the narrowed area either nor could I get good  viewing of the mucosa distal  to the apparent stricture.  Therefore the  scope was withdrawn and the patient was taken to the recovery room in  stable condition.  She tolerated the procedure well and there were no  immediate complications.   IMPRESSION:  Worsening appearance of postbulbar duodenal stenosis with  inability to pass a guidewire or position a balloon catheter for  dilatation.   PLAN:  1. I will obtain follow up Gastrografin/barium study today for      comparison to the previous one; and,  2. The patient may need a gastrojejunostomy type of bypass.           ______________________________  Everardo All Madilyn Fireman, M.D.     JCH/MEDQ  D:  02/29/2008  T:  03/01/2008  Job:  [id number]

## 2010-11-12 NOTE — H&P (Signed)
[name], [name] NO.:  [id number]   MEDICAL RECORD NO.:  [id number]          PATIENT TYPE:  OBV   LOCATION:  1830                         FACILITY:  [facility]   PHYSICIAN:  Nestor Ramp, MD        DATE OF BIRTH:  Aug 13, 1945   DATE OF ADMISSION:  05/04/2007  DATE OF DISCHARGE:                              HISTORY & PHYSICAL   PRIMARY CARE PHYSICIAN:  None.   PRIMARY GASTROENTEROLOGIST:  Everardo All. Madilyn Fireman, M.D.   CHIEF COMPLAINT:  Diffuse abdominal pain and nausea.   HISTORY OF PRESENT ILLNESS:  This is a 65 year old white female with a  history of anxiety, irritable bowel syndrome and insomnia.  She reports  a 1 year history of diffuse abdominal pain which is usually worse in  bilateral upper quadrants.  She was evaluated in March 2008 by Dr. Dorena Cookey.  She reports that an endoscopy at that time showed an ulcer and  she was started on Nexium.  She reports that the Nexium has not improved  her abdominal pain.  She also endorses diarrhea for approximately 10  years which she attributes to irritable bowel syndrome.  However, she  has not had any recent change in her bowel habits.  She also complains  of diffuse abdominal bloating worse over the past 4 days.  She presents  to the emergency room tonight for a 3 to 4 day history of worsening pain  and diffuse abdominal pain as well as decreased oral intake due to  nausea, however she has not vomited.  She also denies any history of  bloody stool or melena.  She does report that she takes nonsteroidal  anti-inflammatory drugs frequently such as Excedrin and Goody's powder  for right knee pain.  She approximates that she takes 6-7 tablets daily  for her pain.  An abdominal CT scan in the emergency department was  significant for acute diverticulitis.  We were called for admission as  the patient is requesting inpatient management of her abdominal pain.   REVIEW OF SYSTEMS:  Please see HPI.  Review of systems is also  pertinent  for the fact that the patient endorses anxiety and social stressors.   PAST MEDICAL HISTORY.:  1. Peptic ulcer disease.  2. Irritable bowel syndrome.  3. Insomnia.  4. Anxiety.   HOME MEDICATIONS:  1. Ambien as needed.  2. Excedrin and Goody's powder as needed.  3. Nexium.   ALLERGIES:  None.   SOCIAL HISTORY:  She currently lives with her husband.  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  She reports  that her husband lost his job 6 months ago and the family now has  increasing financial stress.  She denies any history of alcohol or  tobacco use.   FAMILY HISTORY:  Not contributory.   PHYSICAL EXAMINATION:  VITAL SIGNS:  Temperature 97.6, blood pressure  109/70, heart rate 85, respirations 16.  GENERAL:  The patient is  in no acute distress but does appear ill  however.  NECK:  No JVD, no thyromegaly.  HEENT:  Mucous membranes are dry.  CARDIOVASCULAR:  Regular rate and rhythm without murmur.  LUNGS:  Clear to auscultation bilaterally with normal respiratory  effort.  ABDOMEN:  Bowel sounds are active in all four quadrants.  She is  diffusely tender to deep palpation.  No masses are palpated.  There is  no rebound or guarding.  EXTREMITIES:  No edema.  PSYCHIATRIC:  The patient is very talkative and has tangential speech at  times.  She appears anxious.   LABS:  Significant for a white count 10.9, hemoglobin 14.  A CMET is  completely normal including liver enzymes.  Lipase is 17 and urinalysis  is unremarkable.   RADIOLOGY:  As mentioned above an abdominal CT is significant for  diverticulitis.   IMPRESSION:  This is a 65 year old white female with a history of  irritable bowel syndrome here with a 4-5 day history of worsening  abdominal pain and nausea.  A CT scan in the emergency department  revealed diverticulitis.  1. GI:  The patient's  symptoms are most consistent with irritable      bowel syndrome given her diffuse abdominal pain as opposed to left      lower quadrant pain.  She also endorses nausea, diarrhea and      bloating, all symptomatology of irritable bowel syndrome.      Nevertheless, we will treat for diverticulitis with ciprofloxacin      and Flagyl.  As she reports that she is very hungry we will start a      clear liquid diet and advance as tolerated.  We will also start      Protonix 40 mg IV daily.  The patient is actually requesting      admission although this likely could be managed with outpatient      antibiotics.  We will notify Dr. Madilyn Fireman of admission in the morning      and suspect that she can be discharged in the morning if he has no      plans for further workup and if her clinical condition did not      deteriorate.  2. Volume depletion.  We will start IV fluids at 100 mL per hour.  3. Nausea.  Phenergan as needed.  4. Insomnia.  Ambien 10 mg daily at bedtime.      Sylvan Cheese, M.D.  Electronically Signed      Nestor Ramp, MD  Electronically Signed    MJ/MEDQ  D:  05/04/2007  T:  05/05/2007  Job:  [id number]

## 2010-11-15 NOTE — Discharge Summary (Signed)
[name], Colon NO.:  [id number]   MEDICAL RECORD NO.:  [id number]          PATIENT TYPE:  INP   LOCATION:  1525                         FACILITY:  [facility]   PHYSICIAN:  Sharlet Salina T. Hoxworth, M.D.DATE OF BIRTH:  1946/06/21   DATE OF ADMISSION:  07/19/2008  DATE OF DISCHARGE:  07/24/2008                               DISCHARGE SUMMARY   DISCHARGE DIAGNOSIS:  Gastric outlet obstruction.   OPERATIONS AND PROCEDURES:  Laparoscopic truncal vagotomy, hiatal hernia  repair, gastrojejunostomy and Stamm gastrostomy, Dr. Johna Sheriff 01/20.   HISTORY OF PRESENT ILLNESS:  Colon Colon is a 65 year old female with a  several year history of gradually worsening postprandial nausea,  vomiting, distention and abdominal pain.  Her symptoms have been  progressive.  She had been evaluated by Dr. 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  She underwent dilatation with some temporary  improvement in her symptoms.  However, repeat endoscopy in September  this year again showed a tight stricture in the second portion of the  duodenum, unable to be traversed by the scope.  This felt to be acid  peptic in appearance.  She has failed to respond to medical management  with gradually worsening symptoms and is now electively admitted for  laparoscopic vagotomy and gastrojejunostomy.   PAST MEDICAL HISTORY:  1. She is followed for anxiety reflux.  2. She takes some p.r.n. Lasix for fluid retention.  3. No other serious illness.   PAST SURGICAL HISTORY:  Hysterectomy.   FAMILY/SOCIAL HISTORY/REVIEW OF SYSTEMS:  See detailed H&P.   PERTINENT PHYSICAL EXAMINATION:  Mildly obese.  Abdomen Soft, nontender.  No hernias.   HOSPITAL COURSE:  The patient was admitted on the morning of her  procedure.  Underwent an uneventful laparoscopic truncal vagotomy with  hiatal hernia repair, gastrojejunostomy and Stamm  gastrostomy.  Postoperatively, she did have some skin edge bleeding from one of the  trocar sites that was sutured at the bedside with control.  She  complained of some numbness of her left foot for the first 24 hours, but  this resolved.  She was started on clear liquid diet on the second  postoperative day which she tolerated quite well.  She was advanced  fairly  rapidly to a soft diet, again tolerating this without nausea or pain.  She began having bowel movements.  She is felt ready for discharge on  July 24, 2008.  Her gastrostomy tube was clamped and will be removed  in the office.  Abdomen is nontender.  Wound is healing well.  Final  pathology confirmed a vagus nerve trunk.      Lorne Skeens. Hoxworth, M.D.  Electronically Signed     BTH/MEDQ  D:  08/14/2008  T:  08/14/2008  Job:  [id number]   cc:   Everardo All. Madilyn Fireman, M.D.  Fax: [phone]

## 2010-11-15 NOTE — Op Note (Signed)
[facility]  Patient:    [name], [name]                        MRN: [id number] Proc. Date: 09/02/00 Adm. Date:  [id number] Attending:  Pierce Crane                           Operative Report  PREOPERATIVE DIAGNOSIS:  Degenerative joint disease right knee.  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  OPERATIVE PROCEDURE:  Right knee arthroscopy, chondroplasty of the patella.  SURGEON:  Javier Docker, M.D.  ANESTHESIA:  General endotracheal.  BRIEF HISTORY:  65 year old with chronic knee pain. MRI performed revealed no tear or bony lesions; chondral lesions were noted. Due to persistent symptoms operative intervention is indicated for arthroscopy and debridement. The risks and benefits were discussed including bleeding, infection, damage to vascular structures, no change in symptoms, worsening of symptoms, and the need for repeat procedure in the future including total knee arthroplasty.  DESCRIPTION OF PROCEDURE:  With the patient in supine position and after introduction of adequate general anesthesia and 1 g of Kefzol, the right lower extremity was prepped and draped in the usual sterile fashion. A superomedial parapatellar portal and a lateral parapatellar portal was fashioned with a #11 blade and introduced cannulas atraumatically placed. Irrigant was utilized to insufflate the joint. Under direct visualization an #18 gauge needle was utilized to localize the medial parapatellar portal which was fashioned with a #11 blade sparing the medial meniscus. Inspection of the suprapatellar pouch revealed extensive grade III changes of the patella with fibrillations, normal patellofemoral tracking. There were some minor changes of the sulcus and a small area of grade IV changes near the insertion of the tendon. Examination of the femoral condyles medially  and laterally revealed no evidence of significant chondromalacia. In the medial compartment, the tibial plateau demonstrates mild grade II changes. The meniscus was stable to probing and palpation without evidence of a tear. The ACL and PCL were unremarkable. There was hypertrophic synovitis in the intercondylar notch. The lateral compartment was normal. The lateral meniscus, femoral condyle and tibial plateau and lateral meniscus were stable to probing and palpation with no evidence of a tear. A shaver was introduced via the medial parapatellar port and utilized to form a chondroplasty of the patella and debride the synovitis from the intercondylar region. The shaver was then reintroduced through the lateral parapatellar portal and the superomedial parapatellar portal to complete the chondroplasty of the patella. The small area of grade IV changes near the insertion was not touched due to its position near the insertion. Following this there was no loose cartilaginous debris, there was a good, smooth chondral surface of the patella. Normal patellofemoral tracking. The knee was then copiously irrigated, all compartments inspected including the gutters and revealed no evidence of loose, cartilaginous debris, torn menisci, loose bodies, etc. Next, all instrumentation was then removed. The portals were closed with 4-0 nylon simple sutures. Marcaine 0.50% with epinephrine was infiltrated in the joint -- 10 cc. The wound was dressed sterilely. The patient was awakened without difficulty and transported to the recovery room in satisfactory condition. The patient tolerated the procedure well with no complications. DD:  09/02/00 TD:  09/03/00 Job: [id number]

## 2010-11-15 NOTE — H&P (Signed)
[facility]  Patient:    [name], [name]                          MRN: [id number] Adm. Date:  09/02/00 Attending:  Javier Docker, M.D. Dictator:   Javier Docker, M.D.                         History and Physical  CHIEF COMPLAINT:  Right knee pain.  HISTORY OF PRESENT ILLNESS:  Patient is a 65 year old female who has been seen and evaluated by Dr. Jene Every for right knee pain.  This has been ongoing for 2-3 years now.  She has been seen by her medical physician, was sent for an MRI, which proved to be positive for some articular cartilage thinning with some thinning of the patellar facet and also subchondral cysts.  She has been seen and evaluated.  She was taking Vicodin, found to ambulate with an antalgic gait, found also to have a moderate effusion.  She has undergone injection in the past of steroids which only gave her temporary relief.  Due to the continued pain and lack of improvement with conservative treatment, it is felt she may benefit from undergoing the knee arthroscopy with debridement. Risks and benefits of this procedure have been discussed with the patient and she has elected to proceed with surgery.  ALLERGIES:  No known drug allergies.  INTOLERANCES:  Many NSAIDS irritate her stomach secondary to her IBS.  CURRENT MEDICATIONS:  Tylenol #3.  PAST MEDICAL HISTORY:  Irritable bowel syndrome and gastritis.  PAST SURGICAL HISTORY:  Hysterectomy with BSO.  Appendectomy.  Colonoscopy.  SOCIAL HISTORY:  She is married, one child.  Denies use of tobacco products, only occasional intake of alcohol.  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  FAMILY HISTORY:  Mother deceased, age 87, with cardiomegaly embolism.  Father living at 45 and in good health, only some arthritis.  REVIEW OF SYSTEMS:  GENERAL:  No fevers, chills, night sweats.  She has not rested well recently due to her pain.  NEUROLOGIC:  No  seizures or paralysis. RESPIRATORY:  Patient does have some sinus or allergy type drainage.  No hemoptysis or shortness of breath.  No productive cough.  CARDIOVASCULAR:  No chest pain or orthopnea.  GASTROINTESTINAL:  Patient does have intermittent diarrhea which she attributes to her irritable bowel syndrome.  No constipation, no nausea, vomiting, or bloody mucous in the stool. GENITOURINARY:  No dysuria, hematuria, or discharge.  MUSCULOSKELETAL: Pertinent to the right knee found in the history of present illness.  PHYSICAL EXAMINATION:  VITAL SIGNS:  Pulse 72, respirations 12, blood pressure 142/64.  GENERAL:  Patient is a 65 year old white female, well-nourished, well-developed, short of stature, mildly to moderately anxious at time of exam.  She is alert, oriented, cooperative, very pleasant.  HEENT:  Normocephalic, atraumatic.  Pupils are round and reactive.  EOMs are intact.  Oropharynx is clear.  NECK:  Neck is supple.  CHEST:  Clear to auscultation and percussion.  No rhonchi or rales.  HEART:  Regular rate and rhythm, no murmurs, no rubs, thrills, palpitations.  ABDOMEN:  Slightly round, nontender, bowel sounds are present.  RECTAL/BREASTS/GENITALIA:  Not done, not pertinent to present illness.  EXTREMITIES:  Significant to the right lower extremity.  She is fairly tender to palpation of the medial and lateral joint line.  Some crepitus is  noted. She has mild swelling with trace effusion.  Motor function is intact.  IMPRESSION: 1. Symptomatic osteoarthrosis, right knee, which is refractory to conservative    treatment. 2. Irritable bowel syndrome. 3. History of gastritis.  PLAN:  Patient will be admitted to [facility] to undergo right knee arthroscopy with debridement.  Surgery will be performed by Dr. Jene Every. DD:  08/28/00 TD:  08/30/00 Job: [id number]

## 2011-04-02 LAB — GLUCOSE, CAPILLARY: Glucose-Capillary: 107 — ABNORMAL HIGH

## 2011-04-08 LAB — CBC
HCT: 42.1
Hemoglobin: 12.6
MCV: 85.5
MCV: 86
Platelets: 373
RBC: 4.47
RDW: 13.9
WBC: 10.9 — ABNORMAL HIGH
WBC: 7.9

## 2011-04-08 LAB — BASIC METABOLIC PANEL
Chloride: 105
Creatinine, Ser: 0.59
GFR calc Af Amer: >60
Potassium: 3.7
Sodium: 140

## 2011-04-08 LAB — COMPREHENSIVE METABOLIC PANEL
AST: 30
Albumin: 4.1
BUN: 11
Chloride: 103
Creatinine, Ser: 0.64
GFR calc Af Amer: >60
Total Protein: 8

## 2011-04-08 LAB — DIFFERENTIAL
Eosinophils Relative: 1
Lymphocytes Relative: 27
Lymphs Abs: 3
Monocytes Absolute: 0.7
Monocytes Relative: 6
Neutro Abs: 7.2

## 2011-04-08 LAB — URINALYSIS, ROUTINE W REFLEX MICROSCOPIC
Bilirubin Urine: NEGATIVE
Glucose, UA: NEGATIVE
Hgb urine dipstick: NEGATIVE
Specific Gravity, Urine: 1.009
Urobilinogen, UA: 0.2
pH: 6

## 2011-04-08 LAB — OCCULT BLOOD X 1 CARD TO LAB, STOOL: Fecal Occult Bld: POSITIVE

## 2012-10-14 ENCOUNTER — Emergency Department (HOSPITAL_COMMUNITY): Payer: Medicare Other

## 2012-10-14 ENCOUNTER — Inpatient Hospital Stay (HOSPITAL_COMMUNITY)
Admission: EM | Admit: 2012-10-14 | Discharge: 2012-10-16 | DRG: 690 | Disposition: A | Discharge status: Home / Self Care | Payer: Medicare Other | Attending: Internal Medicine | Admitting: Internal Medicine

## 2012-10-14 ENCOUNTER — Encounter (HOSPITAL_COMMUNITY): Payer: Self-pay | Admitting: Emergency Medicine

## 2012-10-14 DIAGNOSIS — R5381 Other malaise: Secondary | ICD-10-CM | POA: Diagnosis present

## 2012-10-14 DIAGNOSIS — F411 Generalized anxiety disorder: Secondary | ICD-10-CM | POA: Diagnosis present

## 2012-10-14 DIAGNOSIS — I10 Essential (primary) hypertension: Secondary | ICD-10-CM

## 2012-10-14 DIAGNOSIS — K649 Unspecified hemorrhoids: Secondary | ICD-10-CM | POA: Diagnosis present

## 2012-10-14 DIAGNOSIS — IMO0001 Reserved for inherently not codable concepts without codable children: Secondary | ICD-10-CM | POA: Diagnosis present

## 2012-10-14 DIAGNOSIS — W19XXXA Unspecified fall, initial encounter: Secondary | ICD-10-CM

## 2012-10-14 DIAGNOSIS — W010XXA Fall on same level from slipping, tripping and stumbling without subsequent striking against object, initial encounter: Secondary | ICD-10-CM | POA: Diagnosis present

## 2012-10-14 DIAGNOSIS — R195 Other fecal abnormalities: Secondary | ICD-10-CM

## 2012-10-14 DIAGNOSIS — K922 Gastrointestinal hemorrhage, unspecified: Secondary | ICD-10-CM

## 2012-10-14 DIAGNOSIS — E860 Dehydration: Secondary | ICD-10-CM

## 2012-10-14 DIAGNOSIS — N390 Urinary tract infection, site not specified: Principal | ICD-10-CM

## 2012-10-14 DIAGNOSIS — F3289 Other specified depressive episodes: Secondary | ICD-10-CM | POA: Diagnosis present

## 2012-10-14 DIAGNOSIS — Y92009 Unspecified place in unspecified non-institutional (private) residence as the place of occurrence of the external cause: Secondary | ICD-10-CM

## 2012-10-14 DIAGNOSIS — F329 Major depressive disorder, single episode, unspecified: Secondary | ICD-10-CM | POA: Diagnosis present

## 2012-10-14 DIAGNOSIS — M797 Fibromyalgia: Secondary | ICD-10-CM | POA: Diagnosis present

## 2012-10-14 DIAGNOSIS — R7989 Other specified abnormal findings of blood chemistry: Secondary | ICD-10-CM | POA: Diagnosis present

## 2012-10-14 DIAGNOSIS — F419 Anxiety disorder, unspecified: Secondary | ICD-10-CM

## 2012-10-14 DIAGNOSIS — M5030 Other cervical disc degeneration, unspecified cervical region: Secondary | ICD-10-CM | POA: Diagnosis present

## 2012-10-14 HISTORY — DX: Diverticulitis of intestine, part unspecified, without perforation or abscess without bleeding: K57.92

## 2012-10-14 HISTORY — DX: Fibromyalgia: M79.7

## 2012-10-14 HISTORY — DX: Irritable bowel syndrome, unspecified: K58.9

## 2012-10-14 HISTORY — DX: Essential (primary) hypertension: I10

## 2012-10-14 LAB — CBC WITH DIFFERENTIAL/PLATELET
Basophils Absolute: 0 10*3/uL (ref 0.0–0.1)
Basophils Relative: 0 % (ref 0–1)
Eosinophils Absolute: 0.4 10*3/uL (ref 0.0–0.7)
Eosinophils Relative: 4 % (ref 0–5)
HCT: 34.4 % — ABNORMAL LOW (ref 36.0–46.0)
Hemoglobin: 11 g/dL — ABNORMAL LOW (ref 12.0–15.0)
Lymphocytes Relative: 33 % (ref 12–46)
Lymphs Abs: 3.1 10*3/uL (ref 0.7–4.0)
MCH: 27.7 pg (ref 26.0–34.0)
MCHC: 32 g/dL (ref 30.0–36.0)
MCV: 86.6 fL (ref 78.0–100.0)
Monocytes Absolute: 0.9 10*3/uL (ref 0.1–1.0)
Monocytes Relative: 9 % (ref 3–12)
Neutro Abs: 5.1 10*3/uL (ref 1.7–7.7)
Neutrophils Relative %: 53 % (ref 43–77)
Platelets: 319 10*3/uL (ref 150–400)
RBC: 3.97 MIL/uL (ref 3.87–5.11)
RDW: 14.6 % (ref 11.5–15.5)
WBC: 9.5 10*3/uL (ref 4.0–10.5)

## 2012-10-14 LAB — COMPREHENSIVE METABOLIC PANEL
ALT: 111 U/L — ABNORMAL HIGH (ref 0–35)
AST: 313 U/L — ABNORMAL HIGH (ref 0–37)
Albumin: 2.8 g/dL — ABNORMAL LOW (ref 3.5–5.2)
Alkaline Phosphatase: 99 U/L (ref 39–117)
BUN: 29 mg/dL — ABNORMAL HIGH (ref 6–23)
CO2: 24 mEq/L (ref 19–32)
Calcium: 8.8 mg/dL (ref 8.4–10.5)
Chloride: 100 mEq/L (ref 96–112)
Creatinine, Ser: 1.47 mg/dL — ABNORMAL HIGH (ref 0.50–1.10)
GFR calc Af Amer: 42 mL/min — ABNORMAL LOW (ref >90)
GFR calc non Af Amer: 36 mL/min — ABNORMAL LOW (ref >90)
Glucose, Bld: 109 mg/dL — ABNORMAL HIGH (ref 70–99)
Potassium: 4 mEq/L (ref 3.5–5.1)
Sodium: 134 mEq/L — ABNORMAL LOW (ref 135–145)
Total Bilirubin: 0.1 mg/dL — ABNORMAL LOW (ref 0.3–1.2)
Total Protein: 6.6 g/dL (ref 6.0–8.3)

## 2012-10-14 LAB — URINE MICROSCOPIC-ADD ON

## 2012-10-14 LAB — URINALYSIS, ROUTINE W REFLEX MICROSCOPIC
Bilirubin Urine: NEGATIVE
Glucose, UA: NEGATIVE mg/dL
Ketones, ur: NEGATIVE mg/dL
Nitrite: NEGATIVE
Protein, ur: 30 mg/dL — AB
Specific Gravity, Urine: 1.026 (ref 1.005–1.030)
Urobilinogen, UA: 0.2 mg/dL (ref 0.0–1.0)
pH: 6 (ref 5.0–8.0)

## 2012-10-14 LAB — OCCULT BLOOD, POC DEVICE: Fecal Occult Bld: POSITIVE — AB

## 2012-10-14 LAB — LACTIC ACID, PLASMA: Lactic Acid, Venous: 0.6 mmol/L (ref 0.5–2.2)

## 2012-10-14 LAB — LIPASE, BLOOD: Lipase: 16 U/L (ref 11–59)

## 2012-10-14 LAB — TROPONIN I: Troponin I: <0.3 ng/mL (ref <0.30)

## 2012-10-14 LAB — ETHANOL: Alcohol, Ethyl (B): <11 mg/dL (ref 0–11)

## 2012-10-14 MED ORDER — IOHEXOL 300 MG/ML  SOLN
80.0000 mL | Freq: Once | INTRAMUSCULAR | Status: AC | PRN
  Administered 2012-10-14: 80 mL via INTRAVENOUS

## 2012-10-14 MED ORDER — SODIUM CHLORIDE 0.9 % IV BOLUS (SEPSIS)
1000.0000 mL | Freq: Once | INTRAVENOUS | Status: AC
  Administered 2012-10-14: 1000 mL via INTRAVENOUS

## 2012-10-14 MED ORDER — MORPHINE SULFATE 4 MG/ML IJ SOLN
4.0000 mg | Freq: Once | INTRAMUSCULAR | Status: AC
  Administered 2012-10-14: 4 mg via INTRAVENOUS
  Filled 2012-10-14: qty 1

## 2012-10-14 MED ORDER — DEXTROSE 5 % IV SOLN
1.0000 g | Freq: Once | INTRAVENOUS | Status: AC
  Administered 2012-10-15: 1 g via INTRAVENOUS
  Filled 2012-10-14: qty 10

## 2012-10-14 NOTE — ED Notes (Signed)
Patient placed on bedpan. Patient was unable to void. Will re-attempt.

## 2012-10-14 NOTE — ED Notes (Signed)
Pt complains of back pain and neck pain "after falling today" Pt denies LOC, denies vomiting at this time.

## 2012-10-14 NOTE — ED Notes (Signed)
Patient transported to CT 

## 2012-10-14 NOTE — ED Notes (Signed)
Pt able to tolerate PO fluids.  

## 2012-10-14 NOTE — ED Notes (Addendum)
Pt fell yesterday but is unsure of why she fell.  C/o neck and (whole) back pain.  Normally is ambulatory but is not able to ambulate steadily today.  Also c/o headache but denies hitting head or LOC.  Pt lives w/husband and felt she would feel better but states she has gotten worse so decided to come in for eval.  Pt also has hx of fibromyalgia as mentioned by her family.

## 2012-10-14 NOTE — ED Notes (Signed)
MD notified of pts blood pressure, MD at bedside to eval.

## 2012-10-15 ENCOUNTER — Encounter (HOSPITAL_COMMUNITY): Payer: Self-pay

## 2012-10-15 DIAGNOSIS — F32A Depression, unspecified: Secondary | ICD-10-CM | POA: Diagnosis present

## 2012-10-15 DIAGNOSIS — N390 Urinary tract infection, site not specified: Principal | ICD-10-CM

## 2012-10-15 DIAGNOSIS — K922 Gastrointestinal hemorrhage, unspecified: Secondary | ICD-10-CM | POA: Diagnosis present

## 2012-10-15 DIAGNOSIS — F329 Major depressive disorder, single episode, unspecified: Secondary | ICD-10-CM | POA: Diagnosis present

## 2012-10-15 DIAGNOSIS — W19XXXA Unspecified fall, initial encounter: Secondary | ICD-10-CM

## 2012-10-15 DIAGNOSIS — I10 Essential (primary) hypertension: Secondary | ICD-10-CM | POA: Diagnosis present

## 2012-10-15 DIAGNOSIS — R195 Other fecal abnormalities: Secondary | ICD-10-CM

## 2012-10-15 DIAGNOSIS — E860 Dehydration: Secondary | ICD-10-CM

## 2012-10-15 DIAGNOSIS — M797 Fibromyalgia: Secondary | ICD-10-CM | POA: Diagnosis present

## 2012-10-15 LAB — BASIC METABOLIC PANEL
CO2: 26 mEq/L (ref 19–32)
Calcium: 8.1 mg/dL — ABNORMAL LOW (ref 8.4–10.5)
Creatinine, Ser: 0.66 mg/dL (ref 0.50–1.10)
Glucose, Bld: 130 mg/dL — ABNORMAL HIGH (ref 70–99)

## 2012-10-15 LAB — CBC
MCH: 28 pg (ref 26.0–34.0)
MCV: 87.1 fL (ref 78.0–100.0)
Platelets: 272 10*3/uL (ref 150–400)
RBC: 3.71 MIL/uL — ABNORMAL LOW (ref 3.87–5.11)

## 2012-10-15 MED ORDER — ZOLPIDEM TARTRATE 5 MG PO TABS
5.0000 mg | ORAL_TABLET | Freq: Every evening | ORAL | Status: DC | PRN
  Administered 2012-10-15 (×2): 5 mg via ORAL
  Filled 2012-10-15 (×2): qty 1

## 2012-10-15 MED ORDER — SODIUM CHLORIDE 0.9 % IV SOLN
INTRAVENOUS | Status: AC
  Administered 2012-10-15: 02:00:00 via INTRAVENOUS

## 2012-10-15 MED ORDER — PREGABALIN 50 MG PO CAPS
100.0000 mg | ORAL_CAPSULE | Freq: Two times a day (BID) | ORAL | Status: DC
  Administered 2012-10-15 – 2012-10-16 (×4): 100 mg via ORAL
  Filled 2012-10-15 (×4): qty 2

## 2012-10-15 MED ORDER — DEXTROSE 5 % IV SOLN
1.0000 g | INTRAVENOUS | Status: DC
  Administered 2012-10-15: 1 g via INTRAVENOUS
  Filled 2012-10-15 (×2): qty 10

## 2012-10-15 MED ORDER — ENOXAPARIN SODIUM 30 MG/0.3ML ~~LOC~~ SOLN
30.0000 mg | SUBCUTANEOUS | Status: DC
  Administered 2012-10-15 – 2012-10-16 (×2): 30 mg via SUBCUTANEOUS
  Filled 2012-10-15 (×2): qty 0.3

## 2012-10-15 MED ORDER — ALPRAZOLAM 1 MG PO TABS
1.0000 mg | ORAL_TABLET | Freq: Three times a day (TID) | ORAL | Status: DC | PRN
  Administered 2012-10-15 – 2012-10-16 (×5): 1 mg via ORAL
  Filled 2012-10-15 (×5): qty 1

## 2012-10-15 MED ORDER — TRAMADOL HCL 50 MG PO TABS
50.0000 mg | ORAL_TABLET | Freq: Four times a day (QID) | ORAL | Status: DC | PRN
  Administered 2012-10-15 – 2012-10-16 (×4): 50 mg via ORAL
  Filled 2012-10-15 (×4): qty 1

## 2012-10-15 MED ORDER — METHOCARBAMOL 500 MG PO TABS
500.0000 mg | ORAL_TABLET | ORAL | Status: AC
  Administered 2012-10-15: 500 mg via ORAL
  Filled 2012-10-15: qty 1

## 2012-10-15 MED ORDER — ONDANSETRON HCL 4 MG PO TABS
4.0000 mg | ORAL_TABLET | Freq: Four times a day (QID) | ORAL | Status: DC | PRN
  Administered 2012-10-16: 4 mg via ORAL
  Filled 2012-10-15: qty 1

## 2012-10-15 MED ORDER — ONDANSETRON HCL 4 MG/2ML IJ SOLN
4.0000 mg | Freq: Four times a day (QID) | INTRAMUSCULAR | Status: DC | PRN
  Administered 2012-10-15: 4 mg via INTRAVENOUS

## 2012-10-15 MED ORDER — MORPHINE SULFATE 2 MG/ML IJ SOLN
1.0000 mg | INTRAMUSCULAR | Status: DC | PRN
  Administered 2012-10-15 – 2012-10-16 (×8): 1 mg via INTRAVENOUS
  Filled 2012-10-15 (×8): qty 1

## 2012-10-15 NOTE — H&P (Signed)
History and Physical  [name] WJX:[id number] DOB: Nov 26, 1945 DOA: 10/14/2012  Referring physician:  PCP: Pcp Not In System   Chief Complaint: GI bleed  HPI:  Patient is a 67 year old female with past medical history most significant for hypertension, fibromyalgia, irritable bowel syndrome and diverticulitis who comes in with chief complaints of weakness and falls. Patient also complains of 9 out of 10 neck pain which started on Monday. Pain is nonradiating, persistent, aching in character, no exacerbating or relieving factors noted. Patient is usually fairly active but has been unable to do so recently. She lives with her husband. Patient fell this morning due to the weakness. She initially did not seek medical advice but when her weakness did not get better she decided to come and get evaluated. Patient also noted bright red blood per rectum but she has history of hemorrhoids. She has had history of anemia. No other complaints.  In ER physical examination revealed heme-positive stools. Patient was unable to ambulate and required a lot of assistance. ER physician wanted to admit the patient for observation and IV fluids.    Review of Systems:  15 point review of system is negative except what is noted above.  Past Medical History  Diagnosis Date  . IBS (irritable bowel syndrome)   . Diverticulitis   . Hypertension   . Fibromyalgia     Past Surgical History  Procedure Laterality Date  . Abdominal hysterectomy    . Stomach surgery      Social History:  reports that she has never smoked. She does not have any smokeless tobacco history on file. She reports that she drinks about 0.6 ounces of alcohol per week. She reports that she does not use illicit drugs.  No Known Allergies  No family history on file.   Prior to Admission medications   Medication Sig Start Date End Date Taking? Authorizing Provider  ALPRAZolam Prudy Feeler) 1 MG tablet Take 1 mg by mouth 3 (three) times daily as  needed for anxiety.   Yes Historical Provider, MD  furosemide (LASIX) 20 MG tablet Take 20 mg by mouth 2 (two) times daily.   Yes Historical Provider, MD  HYDROcodone-acetaminophen (NORCO) 7.5-325 MG per tablet Take 1 tablet by mouth every 8 (eight) hours as needed for pain.   Yes Historical Provider, MD  potassium chloride SA (K-DUR,KLOR-CON) 20 MEQ tablet Take 20 mEq by mouth daily.   Yes Historical Provider, MD  pregabalin (LYRICA) 100 MG capsule Take 100 mg by mouth 2 (two) times daily.   Yes Historical Provider, MD  promethazine (PHENERGAN) 25 MG tablet Take 25 mg by mouth every 6 (six) hours as needed for nausea.   Yes Historical Provider, MD  zolpidem (AMBIEN) 10 MG tablet Take 10 mg by mouth at bedtime as needed for sleep.   Yes Historical Provider, MD   Physical Exam: Filed Vitals:   10/14/12 2304 10/14/12 2305 10/14/12 2308 10/14/12 2311  BP: 138/55 138/55 127/60 143/66  Pulse:  102 100 98  Temp: 97.6 F (36.4 C)     TempSrc: Oral     Resp: 20     SpO2: 100%       Physical Exam: General: Vital signs reviewed and noted. Well-developed, well-nourished, in no acute distress; alert, appropriate and cooperative throughout examination.patient is extremely anxious about her situation   Head: Normocephalic, atraumatic.  Eyes: PERRL, EOMI, No signs of anemia or jaundince.  Nose: Mucous membranes moist, not inflammed, nonerythematous.  Throat: Oropharynx nonerythematous, no exudate  appreciated.   Neck: No deformities, masses, or tenderness noted.Supple, No carotid Bruits, no JVD.  Lungs:  Normal respiratory effort. Clear to auscultation BL without crackles or wheezes.  Heart: RRR. S1 and S2 normal without gallop, murmur, or rubs.  Abdomen:  BS normoactive. Soft, Nondistended, non-tender.  No masses or organomegaly.  Extremities: No pretibial edema.  Neurologic: A&O X3, CN II - XII are grossly intact. Motor strength is 5/5 in the all 4 extremities, Sensations intact to light touch,  Cerebellar signs negative.  Skin: No visible rashes, scars.     Wt Readings from Last 3 Encounters:  No data found for Wt    Labs on Admission:  Basic Metabolic Panel:  Recent Labs Lab 10/14/12 1809  NA 134*  K 4.0  CL 100  CO2 24  GLUCOSE 109*  BUN 29*  CREATININE 1.47*  CALCIUM 8.8    Liver Function Tests:  Recent Labs Lab 10/14/12 1809  AST 313*  ALT 111*  ALKPHOS 99  BILITOT 0.1*  PROT 6.6  ALBUMIN 2.8*    Recent Labs Lab 10/14/12 1849  LIPASE 16   CBC:  Recent Labs Lab 10/14/12 1809  WBC 9.5  NEUTROABS 5.1  HGB 11.0*  HCT 34.4*  MCV 86.6  PLT 319    Cardiac Enzymes:  Recent Labs Lab 10/14/12 1849  TROPONINI <0.30    Radiological Exams on Admission: Ct Head Wo Contrast  10/14/2012  *RADIOLOGY REPORT*  Clinical Data:  Larey Seat yesterday, not able to ambulate steadily, headache  CT HEAD WITHOUT CONTRAST CT CERVICAL SPINE WITHOUT CONTRAST  Technique:  Multidetector CT imaging of the head and cervical spine was performed following the standard protocol without intravenous contrast.  Multiplanar CT image reconstructions of the cervical spine were also generated.  Comparison:  Normal dictated report of CT of the brain from 2003  CT HEAD  Findings: The ventricular system is normal in size and configuration, and the septum is in a normal midline position.  The fourth ventricle and basilar cisterns are unremarkable.  No hemorrhage, mass lesion, or acute infarction is seen.  On bone window images, no calvarial abnormality is noted.  The paranasal sinuses are clear.  IMPRESSION: Negative unenhanced CT of the brain.  CT CERVICAL SPINE  Findings: The cervical vertebrae are straightened in alignment. There is degenerative disc disease at C5-6 and to a lesser degree at C6-7 with loss of disc space and spurring.  The odontoid process is intact.  No cervical spine fracture is seen.  No prevertebral soft tissue swelling is noted.  Coarse interstitial markings are noted  in both lung apices.  The thyroid gland is normal in size.  IMPRESSION:  1.  Straightened alignment.  No acute fracture. 2.  Degenerative disc disease at C5-6 and C6-7.   Original Report Authenticated By: Dwyane Dee, M.D.    Ct Cervical Spine Wo Contrast  10/14/2012  *RADIOLOGY REPORT*  Clinical Data:  Larey Seat yesterday, not able to ambulate steadily, headache  CT HEAD WITHOUT CONTRAST CT CERVICAL SPINE WITHOUT CONTRAST  Technique:  Multidetector CT imaging of the head and cervical spine was performed following the standard protocol without intravenous contrast.  Multiplanar CT image reconstructions of the cervical spine were also generated.  Comparison:  Normal dictated report of CT of the brain from 2003  CT HEAD  Findings: The ventricular system is normal in size and configuration, and the septum is in a normal midline position.  The fourth ventricle and basilar cisterns are unremarkable.  No  hemorrhage, mass lesion, or acute infarction is seen.  On bone window images, no calvarial abnormality is noted.  The paranasal sinuses are clear.  IMPRESSION: Negative unenhanced CT of the brain.  CT CERVICAL SPINE  Findings: The cervical vertebrae are straightened in alignment. There is degenerative disc disease at C5-6 and to a lesser degree at C6-7 with loss of disc space and spurring.  The odontoid process is intact.  No cervical spine fracture is seen.  No prevertebral soft tissue swelling is noted.  Coarse interstitial markings are noted in both lung apices.  The thyroid gland is normal in size.  IMPRESSION:  1.  Straightened alignment.  No acute fracture. 2.  Degenerative disc disease at C5-6 and C6-7.   Original Report Authenticated By: Dwyane Dee, M.D.    Ct Abdomen Pelvis W Contrast  10/14/2012  *RADIOLOGY REPORT*  Clinical Data: Progressive neck and back pain after a fall.  CT ABDOMEN AND PELVIS WITH CONTRAST  Technique:  Multidetector CT imaging of the abdomen and pelvis was performed following the standard  protocol during bolus administration of intravenous contrast.  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 The efferent and afferent loops are slightly dilated without a focal point of obstruction.  There are a few scattered diverticula in the left side of the colon.  No diverticulitis.  The liver, spleen, pancreas, adrenal glands, and kidneys are normal.  Gallbladder appears normal.  Common bile duct is slightly prominent but unchanged.  Uterus and ovaries have been removed.  There is a grade 1 spondylolisthesis of L5 on S1 with disc space narrowing.  No acute osseous abnormalities.  IMPRESSION: Slight dilatation of the afferent and efferent  loops of the Roux- en-Y. Otherwise, normal exam.   Original Report Authenticated By: Francene Boyers, M.D.     Principal Problem:   Fall Active Problems:   GI bleed   Hypertension   Fibromyalgia   Anxiety and depression   Assessment/Plan Patient is a 67 year old female with past medical history most significant for fibromyalgia who comes in with chief complaints of neck pain and weakness which started Monday that is 4 days prior to admission. Patient is extremely anxious and has not had her Xanax since yesterday. Patient was found to have urinary tract infection which is most likely cause of her weakness. CT scan of the abdomen and pelvis and cervical spine is negative for any acute pathology.  Admit to MedSurg Pain medications and antianxiety medications as per home regimen IV ceftriaxone Physical therapy and occupational therapy evaluation IV fluids Home when stable Check orthostatic vitals q. 8 hours Hold home hypertensives at this time and restart tomorrow   Code Status: Full code Family Communication: Updated at bedside Disposition Plan/Anticipated LOS: Guarded  Time spent: 70 minutes  Lars Mage, MD  [facility] Team 5  If  7PM-7AM, please contact night-coverage at [email or web address], password [facility] 10/15/2012, 12:42 AM

## 2012-10-15 NOTE — Progress Notes (Signed)
[facility] PROGRESS NOTE  [name] WUJ:[id number] DOB: 04-11-1946 DOA: 10/14/2012 PCP: Pcp Not In System  Assessment/Plan:  S/p Fall -seen by PT/OT and SNF vs HH recommended, but pt declines SNF -check TSH, treat UTI Active Problems: UTI -Continue rocephin, follow cultures ?GI bleed/heme + -hemoglobin stable, pt denies further bleeding -will follow and recheck H/H -possibly 2/2 to hemorrhoids, follow  Hypertension -BP soft but stable off meds, follow  Fibromyalgia  -continue outpt meds Anxiety and depression -continue outpt meds Elevated LFTs -unclear etiology -check [place] , and hepatitis panel. ETOH level <11  Code Status: full Family Communication: no family present at bedside Disposition Plan: to home when medically stable, she declines rehab   Consultants:  none  Procedures:  none  Antibiotics:  Rocephin started 4/18  HPI/Subjective: C/o neck pain, denies N/V. She has not had any further blood per rectum. reports She feels weak.  Objective: Filed Vitals:   10/15/12 0145 10/15/12 0604 10/15/12 0606 10/15/12 0607  BP:  99/63 114/75 103/68  Pulse:  84    Temp:  98.2 F (36.8 C)    TempSrc:  Oral    Resp:  20    Height: 5\' 2"  (1.575 m)     Weight: 88.8 kg (195 lb 12.3 oz)     SpO2:  95%     No intake or output data in the 24 hours ending 10/15/12 1300 Filed Weights   10/15/12 0145  Weight: 88.8 kg (195 lb 12.3 oz)    Exam:   General:  Alert and oriented x3  Neck: tender over cervical spine, supple  Cardiovascular: RRR, nl S1S2  Respiratory: CTAB  Abdomen: soft +BS NT/ND  Extremities: no cyanosis and no edema  Data Reviewed: Basic Metabolic Panel:  Recent Labs Lab 10/14/12 1809  NA 134*  K 4.0  CL 100  CO2 24  GLUCOSE 109*  BUN 29*  CREATININE 1.47*  CALCIUM 8.8   Liver Function Tests:  Recent Labs Lab 10/14/12 1809  AST 313*  ALT 111*  ALKPHOS 99  BILITOT 0.1*  PROT 6.6  ALBUMIN 2.8*    Recent  Labs Lab 10/14/12 1849  LIPASE 16   No results found for this basename: AMMONIA,  in the last 168 hours CBC:  Recent Labs Lab 10/14/12 1809  WBC 9.5  NEUTROABS 5.1  HGB 11.0*  HCT 34.4*  MCV 86.6  PLT 319   Cardiac Enzymes:  Recent Labs Lab 10/14/12 1849  TROPONINI <0.30   BNP (last 3 results) No results found for this basename: PROBNP,  in the last 8760 hours CBG: No results found for this basename: GLUCAP,  in the last 168 hours  No results found for this or any previous visit (from the past 240 hour(s)).   Studies: Ct Head Wo Contrast  10/14/2012  *RADIOLOGY REPORT*  Clinical Data:  Larey Seat yesterday, not able to ambulate steadily, headache  CT HEAD WITHOUT CONTRAST CT CERVICAL SPINE WITHOUT CONTRAST  Technique:  Multidetector CT imaging of the head and cervical spine was performed following the standard protocol without intravenous contrast.  Multiplanar CT image reconstructions of the cervical spine were also generated.  Comparison:  Normal dictated report of CT of the brain from 2003  CT HEAD  Findings: The ventricular system is normal in size and configuration, and the septum is in a normal midline position.  The fourth ventricle and basilar cisterns are unremarkable.  No hemorrhage, mass lesion, or acute infarction is seen.  On bone window images,  no calvarial abnormality is noted.  The paranasal sinuses are clear.  IMPRESSION: Negative unenhanced CT of the brain.  CT CERVICAL SPINE  Findings: The cervical vertebrae are straightened in alignment. There is degenerative disc disease at C5-6 and to a lesser degree at C6-7 with loss of disc space and spurring.  The odontoid process is intact.  No cervical spine fracture is seen.  No prevertebral soft tissue swelling is noted.  Coarse interstitial markings are noted in both lung apices.  The thyroid gland is normal in size.  IMPRESSION:  1.  Straightened alignment.  No acute fracture. 2.  Degenerative disc disease at C5-6 and C6-7.    Original Report Authenticated By: Dwyane Dee, M.D.    Ct Cervical Spine Wo Contrast  10/14/2012  *RADIOLOGY REPORT*  Clinical Data:  Larey Seat yesterday, not able to ambulate steadily, headache  CT HEAD WITHOUT CONTRAST CT CERVICAL SPINE WITHOUT CONTRAST  Technique:  Multidetector CT imaging of the head and cervical spine was performed following the standard protocol without intravenous contrast.  Multiplanar CT image reconstructions of the cervical spine were also generated.  Comparison:  Normal dictated report of CT of the brain from 2003  CT HEAD  Findings: The ventricular system is normal in size and configuration, and the septum is in a normal midline position.  The fourth ventricle and basilar cisterns are unremarkable.  No hemorrhage, mass lesion, or acute infarction is seen.  On bone window images, no calvarial abnormality is noted.  The paranasal sinuses are clear.  IMPRESSION: Negative unenhanced CT of the brain.  CT CERVICAL SPINE  Findings: The cervical vertebrae are straightened in alignment. There is degenerative disc disease at C5-6 and to a lesser degree at C6-7 with loss of disc space and spurring.  The odontoid process is intact.  No cervical spine fracture is seen.  No prevertebral soft tissue swelling is noted.  Coarse interstitial markings are noted in both lung apices.  The thyroid gland is normal in size.  IMPRESSION:  1.  Straightened alignment.  No acute fracture. 2.  Degenerative disc disease at C5-6 and C6-7.   Original Report Authenticated By: Dwyane Dee, M.D.    Ct Abdomen Pelvis W Contrast  10/14/2012  *RADIOLOGY REPORT*  Clinical Data: Progressive neck and back pain after a fall.  CT ABDOMEN AND PELVIS WITH CONTRAST  Technique:  Multidetector CT imaging of the abdomen and pelvis was performed following the standard protocol during bolus administration of intravenous contrast.  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 The efferent and afferent loops are slightly dilated without a focal point of obstruction.  There are a few scattered diverticula in the left side of the colon.  No diverticulitis.  The liver, spleen, pancreas, adrenal glands, and kidneys are normal.  Gallbladder appears normal.  Common bile duct is slightly prominent but unchanged.  Uterus and ovaries have been removed.  There is a grade 1 spondylolisthesis of L5 on S1 with disc space narrowing.  No acute osseous abnormalities.  IMPRESSION: Slight dilatation of the afferent and efferent  loops of the Roux- en-Y. Otherwise, normal exam.   Original Report Authenticated By: Francene Boyers, M.D.     Scheduled Meds: . cefTRIAXone (ROCEPHIN)  IV  1 g Intravenous Daily  . enoxaparin (LOVENOX) injection  30 mg Subcutaneous Q24H  . pregabalin  100 mg Oral BID   Continuous Infusions: .  sodium chloride 75 mL/hr at 10/15/12 0139    Principal Problem:   Fall Active Problems:   GI bleed   Hypertension   Fibromyalgia   Anxiety and depression    Time spent: 71    Tenelle Andreason C  [facility] Pager [phone] If 7PM-7AM, please contact night-coverage at [email or web address], password [facility] 10/15/2012, 1:00 PM  LOS: 1 day

## 2012-10-15 NOTE — Evaluation (Signed)
Occupational Therapy Evaluation Patient Details Name: [name] MRN: [id number] DOB: 13-Dec-1945 Today's Date: 10/15/2012 Time: 4098-1191 OT Time Calculation (min): 27 min  OT Assessment / Plan / Recommendation Clinical Impression  67 yo female admitted with fall, weakness. On eval, pt is overal min assist with toileting needs but mod/max assist with LB ADL. Her independence is limited by pt anxiety, pain. Pt repeatedly states "I can't hold my head up..." throughout session.    OT Assessment  Patient needs continued OT Services    Follow Up Recommendations  SNF versus Home health OT; (depending on progress and availability of assist at home). Supervision/Assistance - 24 hour    Barriers to Discharge      Equipment Recommendations  3 in 1 bedside comode    Recommendations for Other Services    Frequency  Min 2X/week    Precautions / Restrictions Precautions Precautions: Fall Restrictions Weight Bearing Restrictions: No        ADL  Eating/Feeding: Simulated;Independent Where Assessed - Eating/Feeding: Bed level Grooming: Performed;Wash/dry hands;Minimal assistance Where Assessed - Grooming: Unsupported standing Upper Body Bathing: Simulated;Chest;Right arm;Left arm;Abdomen;Set up;Supervision/safety (though painful in posterior neck area) Where Assessed - Upper Body Bathing: Unsupported sitting Lower Body Bathing: Simulated;Moderate assistance Where Assessed - Lower Body Bathing: Supported sit to stand Upper Body Dressing: Simulated;Minimal assistance Where Assessed - Upper Body Dressing: Unsupported sitting Lower Body Dressing: Simulated;Maximal assistance (due to neck pain intensity) Where Assessed - Lower Body Dressing: Supported sit to stand Toilet Transfer: Performed;Minimal assistance Toilet Transfer Method: Other (comment) (into bathroom wtih RW) Acupuncturist: Comfort height toilet;Grab bars Toileting - Clothing Manipulation and Hygiene:  Performed;Minimal assistance Where Assessed - Engineer, mining and Hygiene: Standing Equipment Used: Rolling walker ADL Comments: Pt reporting 9/10 posterior neck pain. Pt keeping her head down in flexed position throughout functional mobility and even sitting EOB. Pt unable to bring head/neck to a neutral position without assist and even with AAROM pt complaining of alot of pain. Neck pain seems to be the biggest hindrance to her ADL independence.     OT Diagnosis: Generalized weakness;Acute pain  OT Problem List: Decreased strength;Decreased range of motion;Decreased activity tolerance;Pain;Decreased knowledge of use of DME or AE OT Treatment Interventions: Self-care/ADL training;DME and/or AE instruction;Therapeutic activities   OT Goals Acute Rehab OT Goals OT Goal Formulation: With patient Time For Goal Achievement: 10/22/12 Potential to Achieve Goals: Good ADL Goals Pt Will Perform Grooming: with supervision;Standing at sink ADL Goal: Grooming - Progress: Goal set today Pt Will Perform Lower Body Bathing: with supervision;with adaptive equipment;Sit to stand from chair;Sit to stand from bed ADL Goal: Lower Body Bathing - Progress: Goal set today Pt Will Perform Lower Body Dressing: with supervision;Sit to stand from chair;Sit to stand from bed;with adaptive equipment ADL Goal: Lower Body Dressing - Progress: Goal set today Pt Will Transfer to Toilet: with supervision;Ambulation;3-in-1;with DME ADL Goal: Toilet Transfer - Progress: Goal set today Pt Will Perform Toileting - Clothing Manipulation: with supervision;Standing ADL Goal: Toileting - Clothing Manipulation - Progress: Goal set today  Visit Information  Last OT Received On: 10/15/12 Assistance Needed: +1 PT/OT Co-Evaluation/Treatment: Yes    Subjective Data  Subjective: my neck hurts Patient Stated Goal: none stated other than wanting pain to decrease   Prior Functioning     Home Living Lives With:  Spouse Available Help at Discharge: Family (husband works 12 hour shifts.  not always there) Type of Home: House Home Access: Level entry Home Layout: One level Bathroom  Shower/Tub: Network engineer: Straight cane Prior Function Level of Independence: Independent Driving: Yes Communication Communication: No difficulties Dominant Hand: Right         Vision/Perception     Cognition  Cognition Arousal/Alertness: Awake/alert Behavior During Therapy: Anxious Overall Cognitive Status: Within Functional Limits for tasks assessed    Extremity/Trunk Assessment Right Upper Extremity Assessment RUE ROM/Strength/Tone: Deficits RUE ROM/Strength/Tone Deficits: AROM shoulder flexion to approprimately 120 degrees with pain. When OT attempted to increase ROM, pt still with alot of pain so stopped. Elbow to distal ROM WFL but strength grossly 3+/5. grip is fair. Didnt not MMT at shoulders due to increased 9/10 pain. RUE Sensation: WFL - Light Touch Left Upper Extremity Assessment LUE ROM/Strength/Tone: Deficits LUE ROM/Strength/Tone Deficits: Pt with slightly more AROM in L shoulder compared to R. Shoulder flexion to about 130 degrees; elbow to distal ROM WFL but also grossly 3+/5 strength. grip is fair.  LUE Sensation: WFL - Light Touch Right Lower Extremity Assessment RLE ROM/Strength/Tone: Deficits RLE ROM/Strength/Tone Deficits: Strength at least 4/5 with functional mobility Left Lower Extremity Assessment LLE ROM/Strength/Tone: Deficits LLE ROM/Strength/Tone Deficits: Strength at least 4/5 with functional mobility Trunk Assessment Trunk Assessment: Other exceptions Trunk Exceptions: Limited cervical ROM (only able to test flexion/ext due to pt anxiety). Decreaed cervical extension (pt only able to tolerated AAROM of neck to neutral position). Without external assist pt would not maintain head/neck in neutral     Mobility Bed  Mobility Bed Mobility: Supine to Sit;Sit to Supine Supine to Sit: 4: Min assist Sit to Supine: 4: Min guard Details for Bed Mobility Assistance: VCs safety. Assist for trunk to full upright.  Transfers Sit to Stand: From bed;4: Min assist Stand to Sit: 4: Min guard;To bed Details for Transfer Assistance: VCS safety, technique, hand placement. Assist to rise, stabilize.      Exercise     Balance     End of Session OT - End of Session Activity Tolerance: Patient limited by pain Patient left: in bed;with call bell/phone within reach;with bed alarm set  GO Functional Assessment Tool Used: clinical judgement Functional Limitation: Self care Self Care Current Status (Z3664): At least 40 percent but less than 60 percent impaired, limited or restricted Self Care Goal Status (Q0347): At least 40 percent but less than 60 percent impaired, limited or restricted Self Care Discharge Status [phone]): At least 40 percent but less than 60 percent impaired, limited or restricted   Lennox Laity [phone] 10/15/2012, 12:30 PM

## 2012-10-15 NOTE — ED Provider Notes (Signed)
History     CSN: [id number]  Arrival date & time 10/14/12  1728   First MD Initiated Contact with Patient 10/14/12 1732      Chief Complaint  Patient presents with  . Fall    (Consider location/radiation/quality/duration/timing/severity/associated sxs/prior treatment) HPI Patient presents emergency department with weakness, blood in stool, and falls.  Patient, states, that these symptoms started several weeks ago, with a fall, so that over the last 2 days.  Patient, states, that she is too weak to walk.  Patient denies chest pain, shortness of breath, vomiting, nausea, headache, blurred vision, dizziness, fever, rash or dysuria.  Patient, states she did not take any medications prior to arrival.  Patient, states, that ambulation makes her weakness, worse. Past Medical History  Diagnosis Date  . IBS (irritable bowel syndrome)   . Diverticulitis   . Hypertension   . Fibromyalgia     Past Surgical History  Procedure Laterality Date  . Abdominal hysterectomy    . Stomach surgery      No family history on file.  History  Substance Use Topics  . Smoking status: Never Smoker   . Smokeless tobacco: Not on file  . Alcohol Use: 0.6 oz/week    1 Cans of beer per week    OB History   Grav Para Term Preterm Abortions TAB SAB Ect Mult Living                  Review of Systems All other systems negative except as documented in the HPI. All pertinent positives and negatives as reviewed in the HPI. Allergies  Review of patient's allergies indicates no known allergies.  Home Medications   Current Outpatient Rx  Name  Route  Sig  Dispense  Refill  . ALPRAZolam (XANAX) 1 MG tablet   Oral   Take 1 mg by mouth 3 (three) times daily as needed for anxiety.         . furosemide (LASIX) 20 MG tablet   Oral   Take 20 mg by mouth 2 (two) times daily.         Marland Kitchen HYDROcodone-acetaminophen (NORCO) 7.5-325 MG per tablet   Oral   Take 1 tablet by mouth every 8 (eight) hours as  needed for pain.         . potassium chloride SA (K-DUR,KLOR-CON) 20 MEQ tablet   Oral   Take 20 mEq by mouth daily.         . pregabalin (LYRICA) 100 MG capsule   Oral   Take 100 mg by mouth 2 (two) times daily.         . promethazine (PHENERGAN) 25 MG tablet   Oral   Take 25 mg by mouth every 6 (six) hours as needed for nausea.         Marland Kitchen zolpidem (AMBIEN) 10 MG tablet   Oral   Take 10 mg by mouth at bedtime as needed for sleep.           BP 143/66  Pulse 98  Temp(Src) 97.6 F (36.4 C) (Oral)  Resp 20  SpO2 100%  Physical Exam  Constitutional: She is oriented to person, place, and time. She appears well-developed and well-nourished. No distress.  HENT:  Head: Normocephalic and atraumatic.  Mouth/Throat: No oropharyngeal exudate.  Eyes: Pupils are equal, round, and reactive to light.  Neck: Normal range of motion. Neck supple.  Cardiovascular: Normal rate and regular rhythm.  Exam reveals no gallop and no friction rub.  No murmur heard. Pulmonary/Chest: Effort normal and breath sounds normal. No respiratory distress. She has no wheezes.  Abdominal: Soft. Bowel sounds are normal. She exhibits no distension. There is tenderness. There is no guarding.    Neurological: She is alert and oriented to person, place, and time.  Skin: Skin is warm and dry. No rash noted.    ED Course  Procedures (including critical care time)  Labs Reviewed  CBC WITH DIFFERENTIAL - Abnormal; Notable for the following:    Hemoglobin 11.0 (*)    HCT 34.4 (*)    All other components within normal limits  URINALYSIS, ROUTINE W REFLEX MICROSCOPIC - Abnormal; Notable for the following:    APPearance CLOUDY (*)    Hgb urine dipstick LARGE (*)    Protein, ur 30 (*)    Leukocytes, UA LARGE (*)    All other components within normal limits  COMPREHENSIVE METABOLIC PANEL - Abnormal; Notable for the following:    Sodium 134 (*)    Glucose, Bld 109 (*)    BUN 29 (*)    Creatinine, Ser  1.47 (*)    Albumin 2.8 (*)    AST 313 (*)    ALT 111 (*)    Total Bilirubin 0.1 (*)    GFR calc non Af Amer 36 (*)    GFR calc Af Amer 42 (*)    All other components within normal limits  URINE MICROSCOPIC-ADD ON - Abnormal; Notable for the following:    Squamous Epithelial / LPF FEW (*)    All other components within normal limits  OCCULT BLOOD, POC DEVICE - Abnormal; Notable for the following:    Fecal Occult Bld POSITIVE (*)    All other components within normal limits  URINE CULTURE  LACTIC ACID, PLASMA  TROPONIN I  LIPASE, BLOOD  ETHANOL   Ct Head Wo Contrast  10/14/2012  *RADIOLOGY REPORT*  Clinical Data:  Larey Seat yesterday, not able to ambulate steadily, headache  CT HEAD WITHOUT CONTRAST CT CERVICAL SPINE WITHOUT CONTRAST  Technique:  Multidetector CT imaging of the head and cervical spine was performed following the standard protocol without intravenous contrast.  Multiplanar CT image reconstructions of the cervical spine were also generated.  Comparison:  Normal dictated report of CT of the brain from 2003  CT HEAD  Findings: The ventricular system is normal in size and configuration, and the septum is in a normal midline position.  The fourth ventricle and basilar cisterns are unremarkable.  No hemorrhage, mass lesion, or acute infarction is seen.  On bone window images, no calvarial abnormality is noted.  The paranasal sinuses are clear.  IMPRESSION: Negative unenhanced CT of the brain.  CT CERVICAL SPINE  Findings: The cervical vertebrae are straightened in alignment. There is degenerative disc disease at C5-6 and to a lesser degree at C6-7 with loss of disc space and spurring.  The odontoid process is intact.  No cervical spine fracture is seen.  No prevertebral soft tissue swelling is noted.  Coarse interstitial markings are noted in both lung apices.  The thyroid gland is normal in size.  IMPRESSION:  1.  Straightened alignment.  No acute fracture. 2.  Degenerative disc disease at  C5-6 and C6-7.   Original Report Authenticated By: Dwyane Dee, M.D.    Ct Cervical Spine Wo Contrast  10/14/2012  *RADIOLOGY REPORT*  Clinical Data:  Larey Seat yesterday, not able to ambulate steadily, headache  CT HEAD WITHOUT CONTRAST CT CERVICAL SPINE WITHOUT CONTRAST  Technique:  Multidetector CT imaging of the head and cervical spine was performed following the standard protocol without intravenous contrast.  Multiplanar CT image reconstructions of the cervical spine were also generated.  Comparison:  Normal dictated report of CT of the brain from 2003  CT HEAD  Findings: The ventricular system is normal in size and configuration, and the septum is in a normal midline position.  The fourth ventricle and basilar cisterns are unremarkable.  No hemorrhage, mass lesion, or acute infarction is seen.  On bone window images, no calvarial abnormality is noted.  The paranasal sinuses are clear.  IMPRESSION: Negative unenhanced CT of the brain.  CT CERVICAL SPINE  Findings: The cervical vertebrae are straightened in alignment. There is degenerative disc disease at C5-6 and to a lesser degree at C6-7 with loss of disc space and spurring.  The odontoid process is intact.  No cervical spine fracture is seen.  No prevertebral soft tissue swelling is noted.  Coarse interstitial markings are noted in both lung apices.  The thyroid gland is normal in size.  IMPRESSION:  1.  Straightened alignment.  No acute fracture. 2.  Degenerative disc disease at C5-6 and C6-7.   Original Report Authenticated By: Dwyane Dee, M.D.    Ct Abdomen Pelvis W Contrast  10/14/2012  *RADIOLOGY REPORT*  Clinical Data: Progressive neck and back pain after a fall.  CT ABDOMEN AND PELVIS WITH CONTRAST  Technique:  Multidetector CT imaging of the abdomen and pelvis was performed following the standard protocol during bolus administration of intravenous contrast.  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 The efferent and afferent loops are slightly dilated without a focal point of obstruction.  There are a few scattered diverticula in the left side of the colon.  No diverticulitis.  The liver, spleen, pancreas, adrenal glands, and kidneys are normal.  Gallbladder appears normal.  Common bile duct is slightly prominent but unchanged.  Uterus and ovaries have been removed.  There is a grade 1 spondylolisthesis of L5 on S1 with disc space narrowing.  No acute osseous abnormalities.  IMPRESSION: Slight dilatation of the afferent and efferent  loops of the Roux- en-Y. Otherwise, normal exam.   Original Report Authenticated By: Francene Boyers, M.D.      Patient will be admitted to the hospital by the, [facility].  Patient has responded well to fluids.  Will be given IV Rocephin for suspected urinary tract infect.  Patient initially was in the 58s.  This responded to fluids and her blood pressure was in the 130s recheck.  At 12 AM showed a of blood pressure 98/52.   MDM  MDM Reviewed: nursing note and vitals Interpretation: labs, ECG and x-ray Consults: admitting MD    Date: 10/15/2012  Rate: 96  Rhythm: normal sinus rhythm  QRS Axis: normal  Intervals: normal  ST/T Wave abnormalities: normal  Conduction Disutrbances:none  Narrative Interpretation:   Old EKG Reviewed: unchanged            Carlyle Dolly, PA-C 10/15/12 0013  Carlyle Dolly, PA-C 10/15/12 0013

## 2012-10-15 NOTE — Progress Notes (Signed)
Clinical Social Work Department BRIEF PSYCHOSOCIAL ASSESSMENT 10/15/2012  Patient:  [name], [name]     Account Number:  [id number]     Admit date:  10/14/2012  Clinical Social Worker:  Dennison Bulla  Date/Time:  10/15/2012 12:15 PM  Referred by:  Physician  Date Referred:  10/15/2012 Referred for  SNF Placement   Other Referral:   Interview type:  Patient Other interview type:    PSYCHOSOCIAL DATA Living Status:  FAMILY Admitted from facility:   Level of care:   Primary support name:  John Primary support relationship to patient:  SPOUSE Degree of support available:   Strong    CURRENT CONCERNS Current Concerns  Post-Acute Placement   Other Concerns:    SOCIAL WORK ASSESSMENT / PLAN CSW received referral to assist with dc planning. CSW reviewed chart which stated that PT recommends 24 hour supervision vs HH vs SNF. CSW met with patient at bedside.    CSW introduced myself and explained role. Patient reports that PTA she lived with her husband. Husband works 12 hour shifts but patient is retired. CSW spoke with patient about dc plans. CSW explained SNF option and SNF process. Patient refused SNF and reports that she will return home with assistance from husband.    CSW is signing off but available if further needs arise.   Assessment/plan status:  No Further Intervention Required Other assessment/ plan:   Information/referral to community resources:   SNF information    PATIENT'S/FAMILY'S RESPONSE TO PLAN OF CARE: Patient alert and oriented. Patient engaged throughout assessment and plans to return home at dc.

## 2012-10-15 NOTE — Evaluation (Addendum)
Physical Therapy Evaluation Patient Details Name: [name] MRN: [id number] DOB: 1945-07-07 Today's Date: 10/15/2012 Time: 4098-1191 PT Time Calculation (min): 27 min  PT Assessment / Plan / Recommendation Clinical Impression  67 yo female admitted with fall, weakness. On eval, pt required Min assist for mobility-able to ambulate ~30 feet (15' x 2) in room with RW. Mobility limited by pt anxiety, pain. Pt repeatedly states "I can't hold my head up..." throughout session. Noted muscle tension posterior neck, pain mostly centralized over spinal area. Pt denied numbness, tingling UEs, able to shrug shoulders but with limited ROM-please see OT eval for full UE assessment.  Recommend HHPT vs SNF depending on progress and assist available at home. Encouraged pt to periodically work on extending neck and maintaining in neutral for at least a few seconds at a time throughout the day, especially when upright.     PT Assessment  Patient needs continued PT services    Follow Up Recommendations  Home health PT;SNF;Supervision for mobility/OOB    Does the patient have the potential to tolerate intense rehabilitation      Barriers to Discharge        Equipment Recommendations  Other (comment) (may possibly need RW)    Recommendations for Other Services OT consult   Frequency Min 3X/week    Precautions / Restrictions Precautions Precautions: Fall Restrictions Weight Bearing Restrictions: No   Pertinent Vitals/Pain 9/10 neck, stomach      Mobility  Bed Mobility Bed Mobility: Supine to Sit;Sit to Supine Supine to Sit: 4: Min assist Sit to Supine: 4: Min guard Details for Bed Mobility Assistance: VCs safety. Assist for trunk to full upright.  Transfers Transfers: Sit to Stand;Stand to Sit Sit to Stand: From bed;4: Min assist Stand to Sit: 4: Min guard;To bed Details for Transfer Assistance: VCS safety, technique, hand placement. Assist to rise, stabilize.  Ambulation/Gait: Min  assist Ambulation Distance (Feet): 15 Feet (x 2) Assistive device: Rolling walker Ambulation/Gait Assistance Details: VCS safety. Assist to stabillize pt and maneuver with RW. Pt anxious about neck pain and not being able to hold head up. Pt kept head/neck in flexed position.  Gait Pattern: Step-through pattern;Decreased stride length    Exercises     PT Diagnosis: Difficulty walking;Acute pain  PT Problem List: Decreased range of motion;Decreased activity tolerance;Decreased mobility;Pain PT Treatment Interventions: DME instruction;Gait training;Functional mobility training;Therapeutic activities;Therapeutic exercise;Patient/family education   PT Goals Acute Rehab PT Goals PT Goal Formulation: With patient Time For Goal Achievement: 10/29/12 Potential to Achieve Goals: Good Pt will go Supine/Side to Sit: with modified independence PT Goal: Supine/Side to Sit - Progress: Goal set today Pt will go Sit to Supine/Side: with modified independence PT Goal: Sit to Supine/Side - Progress: Goal set today Pt will go Sit to Stand: with supervision PT Goal: Sit to Stand - Progress: Goal set today Pt will Ambulate: 51 - 150 feet;with supervision;with least restrictive assistive device PT Goal: Ambulate - Progress: Goal set today  Visit Information  Last PT Received On: 10/15/12 Assistance Needed: +1    Subjective Data  Subjective: I can't hold my head up! Patient Stated Goal: Less pain.    Prior Functioning  Home Living Lives With: Spouse Available Help at Discharge: Family (husband works 12 hour shifts.  not always there) Type of Home: House Home Access: Level entry Home Layout: One level Bathroom Shower/Tub: Engineer, manufacturing systems: Standard Home Adaptive Equipment: Straight cane Prior Function Level of Independence: Independent Driving: Yes Communication Communication: No difficulties  Dominant Hand: Right    Cognition  Cognition Arousal/Alertness:  Awake/alert Behavior During Therapy: Anxious Overall Cognitive Status: Within Functional Limits for tasks assessed    Extremity/Trunk Assessment Right Upper Extremity Assessment RUE ROM/Strength/Tone:  (see OT noted) Left Upper Extremity Assessment LUE ROM/Strength/Tone:  (see OT note) Right Lower Extremity Assessment RLE ROM/Strength/Tone: Deficits RLE ROM/Strength/Tone Deficits: Strength at least 4/5 with functional mobility Left Lower Extremity Assessment LLE ROM/Strength/Tone: Deficits LLE ROM/Strength/Tone Deficits: Strength at least 4/5 with functional mobility Trunk Assessment Trunk Assessment: Other exceptions Trunk Exceptions: Limited cervical ROM (only able to test flexion/ext due to pt anxiety). Decreaed cervical extension (pt only able to tolerated AAROM of neck to neutral position). Without external assist pt would not maintain head/neck in neutral   Balance    End of Session PT - End of Session Equipment Utilized During Treatment: Gait belt Activity Tolerance: Patient limited by pain Patient left: in bed;with call bell/phone within reach  GP Functional Assessment Tool Used: clinical judgement Functional Limitation: Mobility: Walking and moving around Mobility: Walking and Moving Around Current Status (Z6109): At least 20 percent but less than 40 percent impaired, limited or restricted Mobility: Walking and Moving Around Goal Status [id number]): At least 1 percent but less than 20 percent impaired, limited or restricted   Rebeca Alert, PT Pager: [phone]

## 2012-10-15 NOTE — ED Provider Notes (Signed)
Medical screening examination/treatment/procedure(s) were conducted as a shared visit with non-physician practitioner(s) and myself.  I personally evaluated the patient during the encounter  Pt with severe non traumatic neck pain.  Pt has been weak, complaints of weakness, near syncope, no CP, sweats.  Also rectal bleeding.  CT's of head and C spine are neg.  PT was hypotensive initially, clinically orthostatic, improved initially with IVF's.  However, BP's dropped again.  + fecal hemoccult.  UTI present, abx given.  PT's neck is not truly stiff, no meningismus, no fever.  Given volatile BP's, lower GI bleed, FTT, will admit.    Gavin Pound. Oletta Lamas, MD 10/15/12 4098

## 2012-10-16 ENCOUNTER — Inpatient Hospital Stay (HOSPITAL_COMMUNITY): Payer: Medicare Other

## 2012-10-16 DIAGNOSIS — IMO0001 Reserved for inherently not codable concepts without codable children: Secondary | ICD-10-CM

## 2012-10-16 DIAGNOSIS — F341 Dysthymic disorder: Secondary | ICD-10-CM

## 2012-10-16 DIAGNOSIS — I10 Essential (primary) hypertension: Secondary | ICD-10-CM

## 2012-10-16 LAB — CBC
MCH: 27.7 pg (ref 26.0–34.0)
MCV: 87.4 fL (ref 78.0–100.0)
Platelets: 295 10*3/uL (ref 150–400)
RDW: 15 % (ref 11.5–15.5)

## 2012-10-16 LAB — URINE CULTURE: Colony Count: >=100000

## 2012-10-16 LAB — COMPREHENSIVE METABOLIC PANEL
BUN: 6 mg/dL (ref 6–23)
CO2: 27 mEq/L (ref 19–32)
Chloride: 104 mEq/L (ref 96–112)
Creatinine, Ser: 0.57 mg/dL (ref 0.50–1.10)
GFR calc non Af Amer: >90 mL/min (ref >90)
Glucose, Bld: 93 mg/dL (ref 70–99)
Total Bilirubin: 0.1 mg/dL — ABNORMAL LOW (ref 0.3–1.2)

## 2012-10-16 LAB — TSH: TSH: 5.87 u[IU]/mL — ABNORMAL HIGH (ref 0.350–4.500)

## 2012-10-16 MED ORDER — FUROSEMIDE 20 MG PO TABS: 20.0000 mg | ORAL_TABLET | Freq: Every morning | ORAL | Status: DC

## 2012-10-16 MED ORDER — TRAMADOL HCL 50 MG PO TABS: 50.0000 mg | ORAL_TABLET | Freq: Four times a day (QID) | ORAL | Status: DC | PRN

## 2012-10-16 MED ORDER — ENOXAPARIN SODIUM 40 MG/0.4ML ~~LOC~~ SOLN: 40.0000 mg | SUBCUTANEOUS | Status: DC

## 2012-10-16 MED ORDER — CEFUROXIME AXETIL 500 MG PO TABS: 500.0000 mg | ORAL_TABLET | Freq: Two times a day (BID) | ORAL | Status: DC

## 2012-10-16 NOTE — Care Management Note (Signed)
Received referral for home health services. Spoke with patient at bedside to discuss home health options and services and she refused. States she does not think she need HHC at this time. Made patient's nurse aware and text paged MD. No further assistance needed since patient declines home health. Raiford Noble, [place] [phone]

## 2012-10-16 NOTE — Discharge Summary (Signed)
Physician Discharge Summary  [name] ZOX:[id number] DOB: Apr 12, 1946 DOA: 10/14/2012  PCP: Pcp Not In System  Admit date: 10/14/2012 Discharge date: 10/16/2012  Time spent: <30 minutes  Recommendations for Outpatient Follow-up:  1. PCP in 1 to 2 weeks  Pending labs TSH and acute hepatitis-patient to followup with PCP for results  Discharge Diagnoses:  Principal Problem:   Fall Active Problems:   GI bleed   Hypertension   Fibromyalgia   Anxiety and depression   Discharge Condition: improved/stable  Diet recommendation: Low sodium heart  Filed Weights   10/15/12 0145  Weight: 88.8 kg (195 lb 12.3 oz)    History of present illness:  Patient is a 67 year old female with past medical history most significant for hypertension, fibromyalgia, irritable bowel syndrome and diverticulitis who comes in with chief complaints of weakness and falls. Patient also complains of 9 out of 10 neck pain which started on Monday. Pain is nonradiating, persistent, aching in character, no exacerbating or relieving factors noted. Patient is usually fairly active but has been unable to do so recently. She lives with her husband. Patient fell this morning due to the weakness. She initially did not seek medical advice but when her weakness did not get better she decided to come and get evaluated. Patient also noted bright red blood per rectum but she has history of hemorrhoids. She has had history of anemia. No other complaints.  In ER physical examination revealed heme-positive stools. Patient was unable to ambulate and required a lot of assistance. ER physician wanted to admit the patient for observation and IV fluids.   Hospital Course:  S/p Fall  -seen by PT/OT and SNF vs HH recommended, but pt declines SNF  -Patient has been set up for home health PT OT -The TSH is pending at this time, she is to followup with her PCP for results and further management as appropriate Active Problems:  Neck  pain On admission patient had a CT scan of her cervical spine which showed a straight in alignment, in no acute fracture. Degenerative disc disease at C5 to 6 and C6-7. She was treated with Ultram as needed and will be discharged on this and follow up outpatient with her PCP. UTI  -Upon admission urinalysis was consistent with a UTI and she was started on Rocephin. She'll be discharged on Ceftin to complete 5 days of antibiotics. Her urine cultures with contaminant-multiple bacteria morphotypes. ?GI bleed/heme +  -Upon admission patient reported that she had had some bright red blood per rectum and has history of hemorrhoids. Hemoccult in ED was positive, but she's not had any further bleeding and her hemoglobin has remained stable. She is to follow up with her PCP for referral to outpatient GI for further evaluation as appropriate. -possibly 2/2 to hemorrhoids. Hypertension  -Blood pressure stable, patient to continue her outpatient medications upon the Fibromyalgia  -continue outpt meds  Anxiety and depression  -continue outpt meds  Elevated LFTs  -unclear etiology, improved today on recheck. Abdominal ultrasound negative for acute abnormality. Dilatation of common bile duct were each noted maximally measuring 11 mm, no common duct stone. Acute hepatitis panel ordered still pending at the time of this discharge note, she is to followup with her PCP for results and further management as appropriate. ETOH level <11    Procedures:  none  Consultations:  none  Discharge Exam: Filed Vitals:   10/15/12 2200 10/16/12 0600 10/16/12 0601 10/16/12 0602  BP: 114/73 130/84 119/80 139/72  Pulse:  94 98 98 101  Temp: 98.4 F (36.9 C) 98.6 F (37 C)    TempSrc: Oral Oral    Resp: 18 18    Height:      Weight:      SpO2: 94% 92%     Exam:  General: Alert and oriented x3  Neck: tender over cervical spine, supple  Cardiovascular: RRR, nl S1S2  Respiratory: CTAB  Abdomen: soft +BS NT/ND   Extremities: no cyanosis and no edema     Discharge Instructions  Discharge Orders   Future Orders Complete By Expires     Diet - low sodium heart healthy  As directed     Increase activity slowly  As directed         Medication List    STOP taking these medications       promethazine 25 MG tablet  Commonly known as:  PHENERGAN      TAKE these medications       ALPRAZolam 1 MG tablet  Commonly known as:  XANAX  Take 1 mg by mouth 3 (three) times daily as needed for anxiety.     BENADRYL ALLERGY/SINUS PO  Take 1 tablet by mouth 2 (two) times daily as needed (for allergies).     cefUROXime 500 MG tablet  Commonly known as:  CEFTIN  Take 1 tablet (500 mg total) by mouth 2 (two) times daily.     furosemide 20 MG tablet  Commonly known as:  LASIX  Take 1 tablet (20 mg total) by mouth every morning.     HYDROcodone-acetaminophen 7.5-325 MG per tablet  Commonly known as:  NORCO  Take 1 tablet by mouth every 8 (eight) hours as needed for pain.     loperamide 2 MG capsule  Commonly known as:  IMODIUM  Take 2 mg by mouth 2 (two) times daily as needed for diarrhea or loose stools.     olmesartan 20 MG tablet  Commonly known as:  BENICAR  Take 20 mg by mouth daily.     potassium chloride SA 20 MEQ tablet  Commonly known as:  K-DUR,KLOR-CON  Take 20 mEq by mouth daily.     pregabalin 100 MG capsule  Commonly known as:  LYRICA  Take 100 mg by mouth 2 (two) times daily.     traMADol 50 MG tablet  Commonly known as:  ULTRAM  Take 1 tablet (50 mg total) by mouth every 6 (six) hours as needed.     zolpidem 10 MG tablet  Commonly known as:  AMBIEN  Take 10 mg by mouth at bedtime as needed for sleep.           Follow-up Information   Please follow up. (PCP in 1-2weeks, call for appt upon discharge)        The results of significant diagnostics from this hospitalization (including imaging, microbiology, ancillary and laboratory) are listed below for  reference.    Significant Diagnostic Studies: Ct Head Wo Contrast  10/14/2012  *RADIOLOGY REPORT*  Clinical Data:  Larey Seat yesterday, not able to ambulate steadily, headache  CT HEAD WITHOUT CONTRAST CT CERVICAL SPINE WITHOUT CONTRAST  Technique:  Multidetector CT imaging of the head and cervical spine was performed following the standard protocol without intravenous contrast.  Multiplanar CT image reconstructions of the cervical spine were also generated.  Comparison:  Normal dictated report of CT of the brain from 2003  CT HEAD  Findings: The ventricular system is normal in size and configuration, and the  septum is in a normal midline position.  The fourth ventricle and basilar cisterns are unremarkable.  No hemorrhage, mass lesion, or acute infarction is seen.  On bone window images, no calvarial abnormality is noted.  The paranasal sinuses are clear.  IMPRESSION: Negative unenhanced CT of the brain.  CT CERVICAL SPINE  Findings: The cervical vertebrae are straightened in alignment. There is degenerative disc disease at C5-6 and to a lesser degree at C6-7 with loss of disc space and spurring.  The odontoid process is intact.  No cervical spine fracture is seen.  No prevertebral soft tissue swelling is noted.  Coarse interstitial markings are noted in both lung apices.  The thyroid gland is normal in size.  IMPRESSION:  1.  Straightened alignment.  No acute fracture. 2.  Degenerative disc disease at C5-6 and C6-7.   Original Report Authenticated By: Dwyane Dee, M.D.    Ct Cervical Spine Wo Contrast  10/14/2012  *RADIOLOGY REPORT*  Clinical Data:  Larey Seat yesterday, not able to ambulate steadily, headache  CT HEAD WITHOUT CONTRAST CT CERVICAL SPINE WITHOUT CONTRAST  Technique:  Multidetector CT imaging of the head and cervical spine was performed following the standard protocol without intravenous contrast.  Multiplanar CT image reconstructions of the cervical spine were also generated.  Comparison:  Normal  dictated report of CT of the brain from 2003  CT HEAD  Findings: The ventricular system is normal in size and configuration, and the septum is in a normal midline position.  The fourth ventricle and basilar cisterns are unremarkable.  No hemorrhage, mass lesion, or acute infarction is seen.  On bone window images, no calvarial abnormality is noted.  The paranasal sinuses are clear.  IMPRESSION: Negative unenhanced CT of the brain.  CT CERVICAL SPINE  Findings: The cervical vertebrae are straightened in alignment. There is degenerative disc disease at C5-6 and to a lesser degree at C6-7 with loss of disc space and spurring.  The odontoid process is intact.  No cervical spine fracture is seen.  No prevertebral soft tissue swelling is noted.  Coarse interstitial markings are noted in both lung apices.  The thyroid gland is normal in size.  IMPRESSION:  1.  Straightened alignment.  No acute fracture. 2.  Degenerative disc disease at C5-6 and C6-7.   Original Report Authenticated By: Dwyane Dee, M.D.    US Abdomen Complete  10/16/2012  *RADIOLOGY REPORT*  Clinical Data:  Elevated liver function tests.  COMPLETE ABDOMINAL ULTRASOUND  Comparison:  CT 10/14/2012.  Findings:  Gallbladder:  Gallbladder distended.  No stones, sludge, pericholecystic fluid or wall thickening.  Please note that throughout the exam, due to technical issue, the measurements were unable be saved on the images.  The gallbladder wall is 2 mm, normal.  Common bile duct:  Dilated at 11 mm.  This may relate to prior passage of stones.  No common duct stone is identified and the common bile duct can be followed to the pancreatic head.  Liver:  No focal lesion identified.  Within normal limits in parenchymal echogenicity.  IVC:  Appears normal.  Pancreas:  Fatty infiltration of the pancreas demonstrate on prior CT.  Spleen:  7.9 cm.  Normal echotexture.  Right Kidney:  10.6 cm. Normal echotexture.  Normal central sinus echo complex.  No calculi or  hydronephrosis.  Left Kidney:  12.0 cm. Normal echotexture.  Normal central sinus echo complex.  No calculi or hydronephrosis.  Abdominal aorta:  No aneurysm identified.  IMPRESSION: No acute  abnormality. dilation of the common bile duct for age, maximally measuring 11 mm.  No common duct stone.  The bilirubin on recent laboratory results was normal, making common duct obstruction extremely unlikely.  Notably, no intrahepatic biliary ductal dilation.   Original Report Authenticated By: Andreas Newport, M.D.    Ct Abdomen Pelvis W Contrast  10/14/2012  *RADIOLOGY REPORT*  Clinical Data: Progressive neck and back pain after a fall.  CT ABDOMEN AND PELVIS WITH CONTRAST  Technique:  Multidetector CT imaging of the abdomen and pelvis was performed following the standard protocol during bolus administration of intravenous contrast.  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 The efferent and afferent loops are slightly dilated without a focal point of obstruction.  There are a few scattered diverticula in the left side of the colon.  No diverticulitis.  The liver, spleen, pancreas, adrenal glands, and kidneys are normal.  Gallbladder appears normal.  Common bile duct is slightly prominent but unchanged.  Uterus and ovaries have been removed.  There is a grade 1 spondylolisthesis of L5 on S1 with disc space narrowing.  No acute osseous abnormalities.  IMPRESSION: Slight dilatation of the afferent and efferent  loops of the Roux- en-Y. Otherwise, normal exam.   Original Report Authenticated By: Francene Boyers, M.D.     Microbiology: Recent Results (from the past 240 hour(s))  URINE CULTURE     Status: None   Collection Time    10/14/12  8:53 PM      Result Value Range Status   Specimen Description URINE, CLEAN CATCH   Final   Special Requests NONE   Final   Culture  Setup Time 10/15/2012 02:17   Final    Colony Count >=100,000 COLONIES/ML   Final   Culture     Final   Value: Multiple bacterial morphotypes present, none predominant. Suggest appropriate recollection if clinically indicated.   Report Status 10/16/2012 FINAL   Final     Labs: Basic Metabolic Panel:  Recent Labs Lab 10/14/12 1809 10/15/12 1610 10/16/12 0540  NA 134* 136 137  K 4.0 4.3 4.0  CL 100 106 104  CO2 24 26 27   GLUCOSE 109* 130* 93  BUN 29* 10 6  CREATININE 1.47* 0.66 0.57  CALCIUM 8.8 8.1* 8.2*   Liver Function Tests:  Recent Labs Lab 10/14/12 1809 10/16/12 0540  AST 313* 109*  ALT 111* 70*  ALKPHOS 99 66  BILITOT 0.1* 0.1*  PROT 6.6 5.3*  ALBUMIN 2.8* 2.2*    Recent Labs Lab 10/14/12 1849  LIPASE 16   No results found for this basename: AMMONIA,  in the last 168 hours CBC:  Recent Labs Lab 10/14/12 1809 10/15/12 1331 10/16/12 0540  WBC 9.5 7.3 6.9  NEUTROABS 5.1  --   --   HGB 11.0* 10.4* 10.3*  HCT 34.4* 32.3* 32.5*  MCV 86.6 87.1 87.4  PLT 319 272 295   Cardiac Enzymes:  Recent Labs Lab 10/14/12 1849  TROPONINI <0.30   BNP: BNP (last 3 results) No results found for this basename: PROBNP,  in the last 8760 hours CBG: No results found for this basename: GLUCAP,  in the last 168 hours     Signed:  Kyndahl Jablon C  [facility] 10/16/2012, 11:53 AM

## 2012-10-17 NOTE — Progress Notes (Signed)
DC instructions gone over with patient, Rx given.  Patient refused home health services.  Transported to front of hospital via wheelchair and picked up by spouse.

## 2013-02-23 ENCOUNTER — Inpatient Hospital Stay (HOSPITAL_COMMUNITY): Payer: Medicare Other

## 2013-02-23 ENCOUNTER — Emergency Department (HOSPITAL_COMMUNITY): Payer: Medicare Other

## 2013-02-23 ENCOUNTER — Encounter (HOSPITAL_COMMUNITY): Payer: Self-pay

## 2013-02-23 ENCOUNTER — Inpatient Hospital Stay (HOSPITAL_COMMUNITY)
Admission: EM | Admit: 2013-02-23 | Discharge: 2013-03-08 | DRG: 870 | Disposition: A | Discharge status: Skilled Nursing Facility | Payer: Medicare Other | Attending: Internal Medicine | Admitting: Internal Medicine

## 2013-02-23 DIAGNOSIS — A419 Sepsis, unspecified organism: Principal | ICD-10-CM | POA: Diagnosis present

## 2013-02-23 DIAGNOSIS — J9601 Acute respiratory failure with hypoxia: Secondary | ICD-10-CM

## 2013-02-23 DIAGNOSIS — E871 Hypo-osmolality and hyponatremia: Secondary | ICD-10-CM | POA: Diagnosis present

## 2013-02-23 DIAGNOSIS — E872 Acidosis, unspecified: Secondary | ICD-10-CM | POA: Diagnosis present

## 2013-02-23 DIAGNOSIS — I214 Non-ST elevation (NSTEMI) myocardial infarction: Secondary | ICD-10-CM

## 2013-02-23 DIAGNOSIS — D649 Anemia, unspecified: Secondary | ICD-10-CM | POA: Diagnosis present

## 2013-02-23 DIAGNOSIS — R5381 Other malaise: Secondary | ICD-10-CM | POA: Diagnosis present

## 2013-02-23 DIAGNOSIS — I959 Hypotension, unspecified: Secondary | ICD-10-CM

## 2013-02-23 DIAGNOSIS — R578 Other shock: Secondary | ICD-10-CM | POA: Diagnosis present

## 2013-02-23 DIAGNOSIS — Z6832 Body mass index (BMI) 32.0-32.9, adult: Secondary | ICD-10-CM

## 2013-02-23 DIAGNOSIS — K5732 Diverticulitis of large intestine without perforation or abscess without bleeding: Secondary | ICD-10-CM | POA: Diagnosis present

## 2013-02-23 DIAGNOSIS — I509 Heart failure, unspecified: Secondary | ICD-10-CM | POA: Diagnosis present

## 2013-02-23 DIAGNOSIS — E669 Obesity, unspecified: Secondary | ICD-10-CM | POA: Diagnosis present

## 2013-02-23 DIAGNOSIS — I2699 Other pulmonary embolism without acute cor pulmonale: Secondary | ICD-10-CM | POA: Diagnosis not present

## 2013-02-23 DIAGNOSIS — M797 Fibromyalgia: Secondary | ICD-10-CM

## 2013-02-23 DIAGNOSIS — R51 Headache: Secondary | ICD-10-CM | POA: Diagnosis present

## 2013-02-23 DIAGNOSIS — R4182 Altered mental status, unspecified: Secondary | ICD-10-CM | POA: Diagnosis present

## 2013-02-23 DIAGNOSIS — I5041 Acute combined systolic (congestive) and diastolic (congestive) heart failure: Secondary | ICD-10-CM | POA: Diagnosis present

## 2013-02-23 DIAGNOSIS — R7309 Other abnormal glucose: Secondary | ICD-10-CM | POA: Diagnosis present

## 2013-02-23 DIAGNOSIS — J9600 Acute respiratory failure, unspecified whether with hypoxia or hypercapnia: Secondary | ICD-10-CM | POA: Diagnosis not present

## 2013-02-23 DIAGNOSIS — I10 Essential (primary) hypertension: Secondary | ICD-10-CM | POA: Diagnosis present

## 2013-02-23 DIAGNOSIS — F411 Generalized anxiety disorder: Secondary | ICD-10-CM | POA: Diagnosis present

## 2013-02-23 DIAGNOSIS — IMO0001 Reserved for inherently not codable concepts without codable children: Secondary | ICD-10-CM | POA: Diagnosis present

## 2013-02-23 DIAGNOSIS — K5289 Other specified noninfective gastroenteritis and colitis: Secondary | ICD-10-CM | POA: Diagnosis present

## 2013-02-23 DIAGNOSIS — K589 Irritable bowel syndrome without diarrhea: Secondary | ICD-10-CM | POA: Diagnosis present

## 2013-02-23 DIAGNOSIS — E876 Hypokalemia: Secondary | ICD-10-CM | POA: Diagnosis not present

## 2013-02-23 DIAGNOSIS — F329 Major depressive disorder, single episode, unspecified: Secondary | ICD-10-CM

## 2013-02-23 DIAGNOSIS — R197 Diarrhea, unspecified: Secondary | ICD-10-CM

## 2013-02-23 DIAGNOSIS — I5181 Takotsubo syndrome: Secondary | ICD-10-CM | POA: Diagnosis not present

## 2013-02-23 DIAGNOSIS — K921 Melena: Secondary | ICD-10-CM

## 2013-02-23 DIAGNOSIS — I2510 Atherosclerotic heart disease of native coronary artery without angina pectoris: Secondary | ICD-10-CM | POA: Diagnosis present

## 2013-02-23 DIAGNOSIS — E870 Hyperosmolality and hypernatremia: Secondary | ICD-10-CM | POA: Diagnosis not present

## 2013-02-23 DIAGNOSIS — E039 Hypothyroidism, unspecified: Secondary | ICD-10-CM | POA: Diagnosis present

## 2013-02-23 DIAGNOSIS — E860 Dehydration: Secondary | ICD-10-CM

## 2013-02-23 DIAGNOSIS — K922 Gastrointestinal hemorrhage, unspecified: Secondary | ICD-10-CM

## 2013-02-23 DIAGNOSIS — G4700 Insomnia, unspecified: Secondary | ICD-10-CM | POA: Diagnosis present

## 2013-02-23 DIAGNOSIS — E785 Hyperlipidemia, unspecified: Secondary | ICD-10-CM | POA: Diagnosis present

## 2013-02-23 DIAGNOSIS — N179 Acute kidney failure, unspecified: Secondary | ICD-10-CM

## 2013-02-23 DIAGNOSIS — R29898 Other symptoms and signs involving the musculoskeletal system: Secondary | ICD-10-CM | POA: Diagnosis present

## 2013-02-23 DIAGNOSIS — I5021 Acute systolic (congestive) heart failure: Secondary | ICD-10-CM

## 2013-02-23 DIAGNOSIS — G8929 Other chronic pain: Secondary | ICD-10-CM | POA: Diagnosis present

## 2013-02-23 DIAGNOSIS — R630 Anorexia: Secondary | ICD-10-CM | POA: Diagnosis present

## 2013-02-23 DIAGNOSIS — R579 Shock, unspecified: Secondary | ICD-10-CM

## 2013-02-23 HISTORY — DX: Other pulmonary embolism without acute cor pulmonale: I26.99

## 2013-02-23 HISTORY — DX: Heart disease, unspecified: I51.9

## 2013-02-23 HISTORY — DX: Other ill-defined heart diseases: I51.89

## 2013-02-23 HISTORY — DX: Non-ST elevation (NSTEMI) myocardial infarction: I21.4

## 2013-02-23 LAB — URINALYSIS, ROUTINE W REFLEX MICROSCOPIC
Leukocytes, UA: NEGATIVE
Nitrite: NEGATIVE
Specific Gravity, Urine: 1.027 (ref 1.005–1.030)
Urobilinogen, UA: 0.2 mg/dL (ref 0.0–1.0)
pH: 5 (ref 5.0–8.0)

## 2013-02-23 LAB — COMPREHENSIVE METABOLIC PANEL
ALT: 25 U/L (ref 0–35)
BUN: 105 mg/dL — ABNORMAL HIGH (ref 6–23)
Calcium: 8 mg/dL — ABNORMAL LOW (ref 8.4–10.5)
Creatinine, Ser: 7.23 mg/dL — ABNORMAL HIGH (ref 0.50–1.10)
GFR calc Af Amer: 6 mL/min — ABNORMAL LOW (ref >90)
GFR calc non Af Amer: 5 mL/min — ABNORMAL LOW (ref >90)
Glucose, Bld: 109 mg/dL — ABNORMAL HIGH (ref 70–99)
Sodium: 126 mEq/L — ABNORMAL LOW (ref 135–145)
Total Protein: 6.7 g/dL (ref 6.0–8.3)

## 2013-02-23 LAB — LIPASE, BLOOD: Lipase: 43 U/L (ref 11–59)

## 2013-02-23 LAB — PROCALCITONIN: Procalcitonin: 6.21 ng/mL

## 2013-02-23 LAB — BASIC METABOLIC PANEL
BUN: 82 mg/dL — ABNORMAL HIGH (ref 6–23)
CO2: 13 mEq/L — ABNORMAL LOW (ref 19–32)
Calcium: 6.6 mg/dL — ABNORMAL LOW (ref 8.4–10.5)
Chloride: 101 mEq/L (ref 96–112)
Creatinine, Ser: 3.93 mg/dL — ABNORMAL HIGH (ref 0.50–1.10)

## 2013-02-23 LAB — CBC WITH DIFFERENTIAL/PLATELET
Eosinophils Absolute: 0 10*3/uL (ref 0.0–0.7)
Eosinophils Relative: 0 % (ref 0–5)
HCT: 33.2 % — ABNORMAL LOW (ref 36.0–46.0)
Lymphs Abs: 1.9 10*3/uL (ref 0.7–4.0)
MCH: 27.5 pg (ref 26.0–34.0)
MCV: 83.6 fL (ref 78.0–100.0)
Monocytes Absolute: 1 10*3/uL (ref 0.1–1.0)
Platelets: 346 10*3/uL (ref 150–400)
RBC: 3.97 MIL/uL (ref 3.87–5.11)

## 2013-02-23 LAB — TYPE AND SCREEN
ABO/RH(D): O POS
Antibody Screen: NEGATIVE

## 2013-02-23 LAB — OCCULT BLOOD, POC DEVICE: Fecal Occult Bld: POSITIVE — AB

## 2013-02-23 LAB — TROPONIN I: Troponin I: <0.3 ng/mL (ref <0.30)

## 2013-02-23 LAB — CG4 I-STAT (LACTIC ACID): Lactic Acid, Venous: 2.14 mmol/L (ref 0.5–2.2)

## 2013-02-23 LAB — URINE MICROSCOPIC-ADD ON

## 2013-02-23 LAB — ABO/RH: ABO/RH(D): O POS

## 2013-02-23 MED ORDER — SODIUM CHLORIDE 0.9 % IV BOLUS (SEPSIS)
1000.0000 mL | Freq: Once | INTRAVENOUS | Status: AC
  Administered 2013-02-23: 1000 mL via INTRAVENOUS

## 2013-02-23 MED ORDER — PIPERACILLIN-TAZOBACTAM IN DEX 2-0.25 GM/50ML IV SOLN
2.2500 g | Freq: Three times a day (TID) | INTRAVENOUS | Status: DC
  Filled 2013-02-23 (×2): qty 50

## 2013-02-23 MED ORDER — SODIUM CHLORIDE 0.9 % IV BOLUS (SEPSIS)
500.0000 mL | Freq: Once | INTRAVENOUS | Status: AC
  Administered 2013-02-23: 500 mL via INTRAVENOUS

## 2013-02-23 MED ORDER — SODIUM CHLORIDE 0.9 % IV SOLN
1000.0000 mL | Freq: Once | INTRAVENOUS | Status: AC
  Administered 2013-02-23: 1000 mL via INTRAVENOUS

## 2013-02-23 MED ORDER — PHENYLEPHRINE HCL 10 MG/ML IJ SOLN
30.0000 ug/min | INTRAVENOUS | Status: DC
  Administered 2013-02-23: 30 ug/min via INTRAVENOUS
  Administered 2013-02-23: 125 ug/min via INTRAVENOUS
  Administered 2013-02-24 (×2): 200 ug/min via INTRAVENOUS
  Administered 2013-02-24: 125 ug/min via INTRAVENOUS
  Filled 2013-02-23 (×4): qty 4

## 2013-02-23 MED ORDER — DEXTROSE-NACL 5-0.9 % IV SOLN
INTRAVENOUS | Status: DC
  Administered 2013-02-23 – 2013-02-24 (×2): via INTRAVENOUS

## 2013-02-23 MED ORDER — SODIUM CHLORIDE 0.9 % IV SOLN
1750.0000 mg | Freq: Once | INTRAVENOUS | Status: DC
  Administered 2013-02-23: 1750 mg via INTRAVENOUS
  Filled 2013-02-23: qty 1750

## 2013-02-23 MED ORDER — OXYCODONE HCL 5 MG PO TABS
5.0000 mg | ORAL_TABLET | Freq: Four times a day (QID) | ORAL | Status: DC | PRN
  Administered 2013-02-23 – 2013-02-24 (×3): 5 mg via ORAL
  Filled 2013-02-23 (×3): qty 1

## 2013-02-23 MED ORDER — BIOTENE DRY MOUTH MT LIQD
15.0000 mL | Freq: Two times a day (BID) | OROMUCOSAL | Status: DC
  Administered 2013-02-23 – 2013-03-02 (×13): 15 mL via OROMUCOSAL

## 2013-02-23 MED ORDER — SODIUM CHLORIDE 0.9 % IV SOLN
250.0000 mL | INTRAVENOUS | Status: DC | PRN
  Administered 2013-02-23 – 2013-02-24 (×2): via INTRAVENOUS

## 2013-02-23 MED ORDER — CIPROFLOXACIN IN D5W 400 MG/200ML IV SOLN
400.0000 mg | INTRAVENOUS | Status: DC
  Administered 2013-02-24: 400 mg via INTRAVENOUS
  Filled 2013-02-23 (×2): qty 200

## 2013-02-23 MED ORDER — ALPRAZOLAM 0.25 MG PO TABS
0.2500 mg | ORAL_TABLET | Freq: Three times a day (TID) | ORAL | Status: DC | PRN
  Administered 2013-02-24 – 2013-02-25 (×5): 0.25 mg via ORAL
  Filled 2013-02-23 (×5): qty 1

## 2013-02-23 MED ORDER — METRONIDAZOLE 500 MG PO TABS
500.0000 mg | ORAL_TABLET | Freq: Three times a day (TID) | ORAL | Status: DC
  Administered 2013-02-23 – 2013-03-02 (×20): 500 mg via ORAL
  Filled 2013-02-23 (×24): qty 1

## 2013-02-23 MED ORDER — NOREPINEPHRINE BITARTRATE 1 MG/ML IJ SOLN
2.0000 ug/min | Freq: Once | INTRAVENOUS | Status: DC
  Filled 2013-02-23: qty 4

## 2013-02-23 MED ORDER — PANTOPRAZOLE SODIUM 40 MG IV SOLR
40.0000 mg | INTRAVENOUS | Status: DC
  Administered 2013-02-24: 40 mg via INTRAVENOUS
  Filled 2013-02-23 (×2): qty 40

## 2013-02-23 MED ORDER — IOHEXOL 300 MG/ML  SOLN
50.0000 mL | Freq: Once | INTRAMUSCULAR | Status: AC | PRN
  Administered 2013-02-23: 50 mL via ORAL

## 2013-02-23 MED ORDER — PIPERACILLIN-TAZOBACTAM 3.375 G IVPB
3.3750 g | Freq: Once | INTRAVENOUS | Status: DC
  Filled 2013-02-23 (×2): qty 50

## 2013-02-23 MED ORDER — VANCOMYCIN HCL IN DEXTROSE 1-5 GM/200ML-% IV SOLN: 1000.0000 mg | INTRAVENOUS | Status: DC

## 2013-02-23 MED ORDER — CIPROFLOXACIN IN D5W 400 MG/200ML IV SOLN
400.0000 mg | Freq: Two times a day (BID) | INTRAVENOUS | Status: DC
  Administered 2013-02-23: 400 mg via INTRAVENOUS
  Filled 2013-02-23: qty 200

## 2013-02-23 MED ORDER — SACCHAROMYCES BOULARDII 250 MG PO CAPS
250.0000 mg | ORAL_CAPSULE | Freq: Two times a day (BID) | ORAL | Status: DC
  Administered 2013-02-23 – 2013-03-08 (×25): 250 mg via ORAL
  Filled 2013-02-23 (×29): qty 1

## 2013-02-23 MED ORDER — ONDANSETRON HCL 4 MG/2ML IJ SOLN
4.0000 mg | Freq: Four times a day (QID) | INTRAMUSCULAR | Status: DC | PRN
  Administered 2013-02-24: 4 mg via INTRAVENOUS
  Filled 2013-02-23: qty 2

## 2013-02-23 NOTE — ED Notes (Signed)
MD at bedside.  EDP Kohut in to see this pt

## 2013-02-23 NOTE — Consult Note (Signed)
Referring Provider: Dr. Synthia Innocent Primary Care Physician:  Dr. Lerry Liner Primary Gastroenterologist:  Dr. Dorena Cookey  Reason for Consultation:  Diarrhea, nausea and vomiting  HPI: [name] is a 67 y.o. female been admitted to the emergency room.  She was admitted to the hospital in April with weakness similar to what she currently has, although at that time there really was not mention of nausea, vomiting, and diarrhea which is her current chief problem. Her husband, who is at the bedside, seems to be the more reliable historian, and indicates that the current presentation is reminiscent of what she had in April. It sounds as though part of her problem in April was low blood pressure, which improved after being taken off Benicar.  She did relatively well until several weeks ago when she began to have a poor appetite and then, several days ago, began having diarrhea which has been fairly continuous in character. She's also had some vomiting shortly after meals, with trouble keeping food down, although that particular symptom seems to be better at the moment. Although she does report some upper abdominal pain, it does not sound as though severe abdominal pain as part of the clinical which are currently. She has had some minimal rectal bleeding, as was also the case back in April. Her stool in the emergency room was occultly heme positive but not overtly melenic or bloody, according to Dr. Sung Amabile, as was also the case back in April.  The patient has a long-standing history of GI problems including irritable bowel syndrome. 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 She indicates that she has not had a recent colonoscopy.  The patient denies recent antibiotic exposure.   Past Medical History  Diagnosis Date  . IBS (irritable bowel syndrome)   . Diverticulitis   . Hypertension   . Fibromyalgia      Past Surgical History  Procedure Laterality Date  . Abdominal hysterectomy    . Stomach surgery      Prior to Admission medications   Medication Sig Start Date End Date Taking? Authorizing Provider  ALPRAZolam Prudy Feeler) 1 MG tablet Take 1 mg by mouth 3 (three) times daily as needed for anxiety.   Yes Historical Provider, MD  Diphenhydramine-Pseudoephed (BENADRYL ALLERGY/SINUS PO) Take 1 tablet by mouth 2 (two) times daily as needed (for allergies).    Yes Historical Provider, MD  furosemide (LASIX) 20 MG tablet Take 1 tablet (20 mg total) by mouth every morning. 10/16/12  Yes Adeline Joselyn Glassman, MD  HYDROcodone-acetaminophen (NORCO) 7.5-325 MG per tablet Take 1 tablet by mouth every 8 (eight) hours as needed for pain (pain).    Yes Historical Provider, MD  loperamide (IMODIUM) 2 MG capsule Take 2 mg by mouth 2 (two) times daily as needed for diarrhea or loose stools.    Yes Historical Provider, MD  potassium chloride SA (K-DUR,KLOR-CON) 20 MEQ tablet Take 20 mEq by mouth daily.   Yes Historical Provider, MD  pregabalin (LYRICA) 100 MG capsule Take 100 mg by mouth 2 (two) times daily.   Yes Historical Provider, MD  zolpidem (AMBIEN) 10 MG tablet Take 10 mg by mouth at bedtime as needed for sleep.   Yes Historical Provider, MD    Current Facility-Administered Medications  Medication Dose Route Frequency Provider Last Rate Last Dose  . 0.9 %  sodium chloride infusion  250 mL Intravenous PRN Merwyn Katos, MD      .  ALPRAZolam Prudy Feeler) tablet 0.25 mg  0.25 mg Oral TID PRN Merwyn Katos, MD      . ciprofloxacin (CIPRO) IVPB 400 mg  400 mg Intravenous Q12H Merwyn Katos, MD 200 mL/hr at 02/23/13 1657 400 mg at 02/23/13 1657  . dextrose 5 %-0.9 % sodium chloride infusion   Intravenous Continuous Merwyn Katos, MD      . metroNIDAZOLE (FLAGYL) tablet 500 mg  500 mg Oral Q8H Merwyn Katos, MD   500 mg at 02/23/13 1655  . phenylephrine (NEO-SYNEPHRINE) 40,000 mcg in dextrose 5 % 250 mL  infusion  30-200 mcg/min Intravenous Continuous Merwyn Katos, MD      . sodium chloride 0.9 % bolus 500 mL  500 mL Intravenous Once Merwyn Katos, MD 1,000 mL/hr at 02/23/13 1657 500 mL at 02/23/13 1657   Current Outpatient Prescriptions  Medication Sig Dispense Refill  . ALPRAZolam (XANAX) 1 MG tablet Take 1 mg by mouth 3 (three) times daily as needed for anxiety.      . Diphenhydramine-Pseudoephed (BENADRYL ALLERGY/SINUS PO) Take 1 tablet by mouth 2 (two) times daily as needed (for allergies).       . furosemide (LASIX) 20 MG tablet Take 1 tablet (20 mg total) by mouth every morning.  30 tablet    . HYDROcodone-acetaminophen (NORCO) 7.5-325 MG per tablet Take 1 tablet by mouth every 8 (eight) hours as needed for pain (pain).       Marland Kitchen loperamide (IMODIUM) 2 MG capsule Take 2 mg by mouth 2 (two) times daily as needed for diarrhea or loose stools.       . potassium chloride SA (K-DUR,KLOR-CON) 20 MEQ tablet Take 20 mEq by mouth daily.      . pregabalin (LYRICA) 100 MG capsule Take 100 mg by mouth 2 (two) times daily.      Marland Kitchen zolpidem (AMBIEN) 10 MG tablet Take 10 mg by mouth at bedtime as needed for sleep.        Allergies as of 02/23/2013  . (No Known Allergies)    History reviewed. No pertinent family history.  History   Social History  . Marital Status: Married    Spouse Name: N/A    Number of Children: N/A  . Years of Education: N/A   Occupational History  . Not on file.   Social History Main Topics  . Smoking status: Never Smoker   . Smokeless tobacco: Never Used  . Alcohol Use: 0.6 oz/week    1 Cans of beer per week  . Drug Use: No  . Sexual Activity: No   Other Topics Concern  . Not on file   Social History Narrative  . No narrative on file    Review of Systems: See history of present illness  Physical Exam: Vital signs in last 24 hours: Temp:  [97.6 F (36.4 C)] 97.6 F (36.4 C) (08/27 1151) Pulse Rate:  [98-107] 98 (08/27 1330) Resp:  [7-18] 16 (08/27  1330) BP: (70-122)/(29-81) 83/35 mmHg (08/27 1330) SpO2:  [91 %-99 %] 97 % (08/27 1330) Weight:  [88.9 kg (195 lb 15.8 oz)] 88.9 kg (195 lb 15.8 oz) (08/27 1330)   General:   Alert,  Well-developed, well-nourished, pleasant and cooperative in NAD. The patient does not appear cachectic at all. Head:  Normocephalic and atraumatic. Eyes:  Sclera clear, no icterus.   Conjunctiva pink. Mouth:   No ulcerations or lesions.  Oropharynx pink & moist. Neck:   No masses or thyromegaly. Lungs: Slight  expiratory rhonchi bilaterally (smokes occasionally). Heart:  Slightly rapid rate and regular rhythm; no murmurs, clicks, rubs,  or gallops. Abdomen:  The abdomen is substantially obese. Bowel sounds are diminished, and nonobstructive in character. No bruit appreciated. Abdomen has some left upper quadrant tympany, otherwise no tympany elsewhere. The abdomen is soft and no organomegaly, guarding, or masses are appreciated. The patient reports some subjective tenderness to palpation, nothing impressive Rectal:  Reportedly showed nonmelenic but heme positive stool   Msk:   Symmetrical without gross deformities. Extremities:   Without clubbing, cyanosis, or edema. Neurologic:  Alert and coherent;  grossly normal neurologically. Skin:  Intact without significant lesions or rashes. There is significant pallor of the palms, but no significant skin tenting at this time, status post IV fluid administration Cervical Nodes:  No significant cervical adenopathy. Psych:   Alert and cooperative. Slightly distant affect appear  Intake/Output from previous day:   Intake/Output this shift: Total I/O In: 4200 [I.V.:4200] Out: -   Lab Results:  Recent Labs  02/23/13 1232  WBC 13.1*  HGB 10.9*  HCT 33.2*  PLT 346   BMET  Recent Labs  02/23/13 1232  NA 126*  K 4.2  CL 85*  CO2 12*  GLUCOSE 109*  BUN 105*  CREATININE 7.23*  CALCIUM 8.0*   LFT  Recent Labs  02/23/13 1232  PROT 6.7  ALBUMIN 3.0*   AST 46*  ALT 25  ALKPHOS 116  BILITOT 0.1*   PT/INR No results found for this basename: LABPROT, INR,  in the last 72 hours   Studies/Results: Dg Chest Portable 1 View  02/23/2013   *RADIOLOGY REPORT*  Clinical Data: Neck pain.  Diarrhea.  Abdominal pain.  Rectal bleeding.  PORTABLE CHEST - 1 VIEW  Comparison: Chest x-ray 03/29/2009.  Findings: There is a left-sided internal jugular central venous catheter with tip terminating in the proximal superior vena cava. Lung volumes are slightly low.  No acute consolidative air space disease.  No pleural effusions.  No evidence of pulmonary edema. Heart size is within normal limits.  Upper mediastinal contours are within normal limits for the portable supine technique.  No pneumothorax.  IMPRESSION: 1.  No radiographic evidence of acute cardiopulmonary disease. 2.  Left internal jugular central venous catheter with tip in the proximal superior vena cava.   Original Report Authenticated By: Trudie Reed, M.D.    Impression: Marked prerenal azotemia in a patient with recent nausea and vomiting, and apparently fairly severe diarrhea, superimposed on a long-standing history of IBS and possibly similar symptoms prompting hospitalization several months ago  Plan: Fluid resuscitation. Check C. difficile. Probiotics. Once the patient has had improvement in her renal function, consideration could be given to an endoscopy and probably an unprepped colonoscopy with random mucosal biopsies to look for evidence of microscopic colitis. In the meantime, I will obtain a CT scan without IV contrast, looking for possible secretory tumors or evidence of bowel wall edema to suggest colitis or enteritis.   LOS: 0 days   Tahni Porchia V  02/23/2013, 4:58 PM

## 2013-02-23 NOTE — ED Notes (Signed)
Pt presents with low bp c/o of diarrhea and stomach pain presents to ED with low bp 70 systolic and brought to res a.  Pt is alert and active with care. C/o of generalized weakness-

## 2013-02-23 NOTE — H&P (Signed)
PULMONARY  / CRITICAL CARE MEDICINE  Name: [name] MRN: [id number] DOB: [birth year]    ADMISSION DATE:  02/23/2013  REFERRING MD :  EDP PRIMARY SERVICE: PCCM  REASON FOR ADMISSION: persistent hypotension after volume resuscitation  BRIEF PATIENT DESCRIPTION: 71 F with hx of IBS admitted via [facility] ED with 2-3 wks of N/V, anorexia, severe diarrhea and 2-3 days of weakness, HA. Remained hypotensive despite 4 liters NS in ED.  Admitted with suspected gastroenteritis, hypovolemic shock, r/o septic shock. ARI, hyponatremia  SIGNIFICANT EVENTS / STUDIES:    LINES / TUBES: L IJ CVL 8/26 >>   CULTURES: C diff 8/26 >>  Stool 8/26 >>  Urine 8/26 >>  Blood 8/26 >>   ANTIBIOTICS: Ciprofloxacin 8/26 >>  Metronidazole (PO) 8/26 >>   HISTORY OF PRESENT ILLNESS:  89F with PMH of IBS, chronic diarrhea, duodenal stricture repair presented to ED as noted above. She remained hypotensive despite 4L NS. Also c/o blood on tissue after wiping. Describes stools as watery but not frankly bloody. Has recently been started on an anti-hypertensive medication. Unsure name of it. Describes profund weakness on day of admission. Also c/o worsening HA over past 2-3 days. Denies fever, chills, sweats, CP, cough, SOB, sputum, hemoptysis, photophobia, nuchal rigidity. Has chronic intermittent LE edema - not worse than baseline.   PAST MEDICAL HISTORY :  Past Medical History  Diagnosis Date  . IBS (irritable bowel syndrome)   . Diverticulitis   . Hypertension   . Fibromyalgia     - ?Hypothroidism (per patient. Levothyroxine not documented as home med) Past Surgical History  Procedure Laterality Date  . Abdominal hysterectomy    . Stomach surgery     Prior to Admission medications   Medication Sig Start Date End Date Taking? Authorizing Provider  ALPRAZolam Prudy Feeler) 1 MG tablet Take 1 mg by mouth 3 (three) times daily as needed for anxiety.   Yes Historical Provider, MD  Diphenhydramine-Pseudoephed (BENADRYL  ALLERGY/SINUS PO) Take 1 tablet by mouth 2 (two) times daily as needed (for allergies).    Yes Historical Provider, MD  furosemide (LASIX) 20 MG tablet Take 1 tablet (20 mg total) by mouth every morning. 10/16/12  Yes Adeline Joselyn Glassman, MD  HYDROcodone-acetaminophen (NORCO) 7.5-325 MG per tablet Take 1 tablet by mouth every 8 (eight) hours as needed for pain (pain).    Yes Historical Provider, MD  loperamide (IMODIUM) 2 MG capsule Take 2 mg by mouth 2 (two) times daily as needed for diarrhea or loose stools.    Yes Historical Provider, MD  potassium chloride SA (K-DUR,KLOR-CON) 20 MEQ tablet Take 20 mEq by mouth daily.   Yes Historical Provider, MD  pregabalin (LYRICA) 100 MG capsule Take 100 mg by mouth 2 (two) times daily.   Yes Historical Provider, MD  zolpidem (AMBIEN) 10 MG tablet Take 10 mg by mouth at bedtime as needed for sleep.   Yes Historical Provider, MD   No Known Allergies  FAMILY HISTORY:  History reviewed. No pertinent family history. SOCIAL HISTORY:  reports that she has never smoked. She has never used smokeless tobacco. She reports that she drinks about 0.6 ounces of alcohol per week. She reports that she does not use illicit drugs.  REVIEW OF SYSTEMS:  AS per HPI. Otherwise, detailed ROS negative  SUBJECTIVE:   VITAL SIGNS: Temp:  [97.6 F (36.4 C)] 97.6 F (36.4 C) (08/27 1151) Pulse Rate:  [98-107] 98 (08/27 1330) Resp:  [7-18] 16 (08/27 1330) BP: (70-122)/(29-81) 83/35  mmHg (08/27 1330) SpO2:  [91 %-99 %] 97 % (08/27 1330) Weight:  [88.9 kg (195 lb 15.8 oz)] 88.9 kg (195 lb 15.8 oz) (08/27 1330) HEMODYNAMICS:   VENTILATOR SETTINGS:   INTAKE / OUTPUT: Intake/Output     08/26 0701 - 08/27 0700 08/27 0701 - 08/28 0700   I.V. (mL/kg)  4200 (47.2)   Total Intake(mL/kg)  4200 (47.2)   Net   +4200          PHYSICAL EXAMINATION: General:  Acutely ill but no overt distress, pleasant Neuro:  CNs intact, motor/sens grossly intact, DTRs symmetric HEENT: NCAT,  EOMI, PERRL Cardiovascular: RRR s M Lungs: CTAP Abdomen: firm, not rigid, dull to perc, NABS, no HSM, mildly diffusely tender without rebound or guarding Ext: cool, diminished DP pulses, no edema Skin: no rashes, lesions  LABS:  CBC Recent Labs     02/23/13  1232  WBC  13.1*  HGB  10.9*  HCT  33.2*  PLT  346   Coag's No results found for this basename: APTT, INR,  in the last 72 hours BMET Recent Labs     02/23/13  1232  NA  126*  K  4.2  CL  85*  CO2  12*  BUN  105*  CREATININE  7.23*  GLUCOSE  109*   Electrolytes Recent Labs     02/23/13  1232  CALCIUM  8.0*   Sepsis Markers No results found for this basename: LACTICACIDVEN, PROCALCITON, O2SATVEN,  in the last 72 hours ABG No results found for this basename: PHART, PCO2ART, PO2ART,  in the last 72 hours Liver Enzymes Recent Labs     02/23/13  1232  AST  46*  ALT  25  ALKPHOS  116  BILITOT  0.1*  ALBUMIN  3.0*   Cardiac Enzymes Recent Labs     02/23/13  1232  TROPONINI  <0.30   Glucose No results found for this basename: GLUCAP,  in the last 72 hours   CXR: NACPD  ASSESSMENT / PLAN:  PULMONARY A: No issues P:   Supplemental O2 as needed  CARDIOVASCULAR A: H/O hypertension Hypotension, presumed hypovolemic - r/o septic P:  CVP goal 8-12 Phenylephrine to maintain MAP > 60 mmHg  RENAL A:  Acute renal failure, nonoliguric - should resolve with resuscitation Metabolic acidosis Hyponatremia  P:   Volume resuscitate Need to determine what anti-hypertensive was recently started (?ACEI or ARB) Monitor BMET intermittently Correct electrolytes as indicated   GASTROINTESTINAL A:  H/O IBS Nausea, anorexia Profound diarrhea Heme+ stool (card +, not grossly) - suspect from external irritation  P:   KUB ordered discusssed with Dr Matthias Hughs in Dr Madilyn Fireman' absence  Will see 8/27 or 8/28 AM Bland diet as tolerated PRN zofran Consider immodium once C diff r/o'd  HEMATOLOGIC A:  No  issues P:  Monitor CBC intermittently  INFECTIOUS A:  Suspected gastroenteritis Suspected severe sepsis P:   PCT algorithm Micro and abx as above  ENDOCRINE A:  Reported history of hypothyroidism. Not on levothyroxine P:   TSH in AM 8/28  NEUROLOGIC A: Chronic pain P:   PRN oxycodone for pain  TODAY'S SUMMARY:   I have personally obtained a history, examined the patient, evaluated laboratory and imaging results, formulated the assessment and plan and placed orders. CRITICAL CARE: The patient is critically ill with multiple organ systems failure and requires high complexity decision making for assessment and support, frequent evaluation and titration of therapies, application of advanced monitoring technologies and  extensive interpretation of multiple databases. Critical Care Time devoted to patient care services described in this note is 45 mins minutes.   Billy Fischer, MD ; [facility] [phone].  After 5:30 PM or weekends, call [phone] Pulmonary and Critical Care Medicine [facility] Pager: [phone]  02/23/2013, 4:57 PM

## 2013-02-23 NOTE — Progress Notes (Signed)
eLink Physician-Brief Progress Note Patient Name: [name] DOB: 1945/08/21 MRN: [id number]  Date of Service  02/23/2013   HPI/Events of Note   BP remains low after 5L fluid CVP 9 mMentating well & UO picking up  eICU Interventions  Give 1L bolus Start neo gtt Rpt lactate   Intervention Category Intermediate Interventions: Hypotension - evaluation and management  ALVA,RAKESH V. 02/23/2013, 8:09 PM

## 2013-02-23 NOTE — ED Notes (Signed)
MD at bedside. Critical Care here to see pt Family

## 2013-02-23 NOTE — ED Provider Notes (Signed)
I saw and evaluated the patient, reviewed the resident's note and I agree with the findings and plan.  67yF with hypovolemic shock refractory to aggressive IVF. Suspect severe dehydration. Recent diarrhea with concurrent diuretic use. Describes bright blood when wiping after BM. Perirectal region raw and suspect this is source. No melena or blood mixed in with stool. No blood thinners. Hemoglobin at baseline but likely significant hemoconcentration.  Less likely sepsis. Empiric abx ordered though. Cultures ordered. Abdominal pain but exam benign. No respiratory complaints. CCM consulted for admission. Line placed.  CENTRAL LINE Performed by: Raeford Razor Consent: The procedure was performed in an emergent situation. Required items: required blood products, implants, devices, and special equipment available Patient identity confirmed: arm band and provided demographic data Time out: Immediately prior to procedure a "time out" was called to verify the correct patient, procedure, equipment, support staff and site/side marked as required. Indications: vascular access Anesthesia: local infiltration Local anesthetic: lidocaine 1% w/o Anesthetic total: 3 ml Patient sedated: no Preparation: skin prepped with 2% chlorhexidine Skin prep agent dried: skin prep agent completely dried prior to procedure Sterile barriers: all five maximum sterile barriers used - cap, mask, sterile gown, sterile gloves, and large sterile sheet Hand hygiene: hand hygiene performed prior to central venous catheter insertion  Location details: L IJ Catheter type: triple lumen Catheter size: 8 Fr Pre-procedure: landmarks identified Ultrasound guidance: yes Successful placement: yes Post-procedure: line sutured and dressing applied Assessment: blood return through all parts, free fluid flow, placement verified by x-ray and no pneumothorax on x-ray Patient tolerance: Patient tolerated the procedure well with no immediate  complications.  CRITICAL CARE Performed by: Raeford Razor  Total critical care time: 75 minutes  Critical care time was exclusive of separately billable procedures and treating other patients. Critical care was necessary to treat or prevent imminent or life-threatening deterioration. Critical care was time spent personally by me on the following activities: development of treatment plan with patient and/or surrogate as well as nursing, discussions with consultants, evaluation of patient's response to treatment, examination of patient, obtaining history from patient or surrogate, ordering and performing treatments and interventions, ordering and review of laboratory studies, ordering and review of radiographic studies, pulse oximetry and re-evaluation of patient's condition.   Raeford Razor, MD 02/24/13 [phone]

## 2013-02-23 NOTE — ED Notes (Signed)
MADE AWARE OF PENDING IV MED NEO , PROTONIC, DEXTROSE.    BELONGING BAG X 1 WITH PT

## 2013-02-23 NOTE — ED Notes (Signed)
MD at bedside.  Dr Matthias Hughs present speaking to husband and pt

## 2013-02-23 NOTE — Progress Notes (Signed)
Patient declines MyChart activation-she does not have a computer 

## 2013-02-23 NOTE — Progress Notes (Signed)
Utilization Review completed.  Domonic Kimball RN CM  

## 2013-02-23 NOTE — Progress Notes (Signed)
ANTIBIOTIC CONSULT NOTE - INITIAL  Pharmacy Consult for Vancomycin, Zosyn Indication: r/o sepsis  No Known Allergies  Patient Measurements:   TBW 89kg  Vital Signs: Temp: 97.6 F (36.4 C) (08/27 1151) Temp src: Oral (08/27 1151) BP: 83/35 mmHg (08/27 1330) Pulse Rate: 98 (08/27 1330) Intake/Output from previous day:   Intake/Output from this shift: Total I/O In: 2000 [I.V.:2000] Out: -   Labs:  Recent Labs  02/23/13 1232  WBC 13.1*  HGB 10.9*  PLT 346  CREATININE 7.23*   The CrCl is unknown because both a height and weight (above a minimum accepted value) are required for this calculation. No results found for this basename: VANCOTROUGH, VANCOPEAK, VANCORANDOM, GENTTROUGH, GENTPEAK, GENTRANDOM, TOBRATROUGH, TOBRAPEAK, TOBRARND, AMIKACINPEAK, AMIKACINTROU, AMIKACIN,  in the last 72 hours   Microbiology: No results found for this or any previous visit (from the past 720 hour(s)).  Medical History: Past Medical History  Diagnosis Date  . IBS (irritable bowel syndrome)   . Diverticulitis   . Hypertension   . Fibromyalgia    Medications:  Anti-infectives   Start     Dose/Rate Route Frequency Ordered Stop   02/25/13 1600  vancomycin (VANCOCIN) IVPB 1000 mg/200 mL premix     1,000 mg 200 mL/hr over 60 Minutes Intravenous Every 48 hours 02/23/13 1419     02/23/13 1500  piperacillin-tazobactam (ZOSYN) IVPB 3.375 g  Status:  Discontinued     3.375 g 100 mL/hr over 30 Minutes Intravenous  Once 02/23/13 1402 02/23/13 1412   02/23/13 1500  vancomycin (VANCOCIN) 1,750 mg in sodium chloride 0.9 % 500 mL IVPB     1,750 mg 250 mL/hr over 120 Minutes Intravenous  Once 02/23/13 1418     02/23/13 1430  piperacillin-tazobactam (ZOSYN) IVPB 2.25 g     2.25 g 100 mL/hr over 30 Minutes Intravenous 3 times per day 02/23/13 1414       Assessment: 67 yoF with abdominal pain, rectal bleed, hypotensive upon presentation to the ED. In acute renal failure. Hypovolemic shock vs  sepsis.  Vancomycin and Zosyn per pharmacy  Clearance ~ 10 ml/min  Goal of Therapy:  Vancomycin trough level 15-20 mcg/ml  Plan:   Vancomycin 1750mg  x1, then 1000mg  q48h   Zosyn 2.25 gm q8h  Doses based on admit Creatinine, anticipate medications will need adjustment as Creatinine improves.  Otho Bellows PharmD Pager [phone] 02/23/2013, 3:13 PM

## 2013-02-23 NOTE — ED Notes (Signed)
CT REQUESTING TO HOLD TRANSFER. REQUESTING TO PERFORM CT BEFORE TRANSFER. DELAY 20 MINUTES.  PT MADE AWARE

## 2013-02-23 NOTE — Progress Notes (Signed)
MEDICATION RELATED CONSULT NOTE - INITIAL   Pharmacy Consult for:  Antibiotic renal dose adjustment Antibiotics ordered:  IV ciprofloxacin, PO metronidazole Indication:  Suspected gastroenteritis, rule out septic shock  No Known Allergies  Patient Measurements: Height: 5' 1.81" (157 cm) Weight: 195 lb 15.8 oz (88.9 kg) IBW/kg (Calculated) : 49.67  Vital Signs: Temp: 97.3 F (36.3 C) (08/27 1738) Temp src: Oral (08/27 1738) BP: 100/84 mmHg (08/27 1738) Pulse Rate: 99 (08/27 1738)   Labs:  Recent Labs  02/23/13 1232  WBC 13.1*  HGB 10.9*  HCT 33.2*  PLT 346  CREATININE 7.23*  ALBUMIN 3.0*  PROT 6.7  AST 46*  ALT 25  ALKPHOS 116  BILITOT 0.1*   Estimated Creatinine Clearance: 8 ml/min (by C-G formula based on Cr of 7.23).  Microbiology: The following cultures are pending:  C. difficile, stool, urine, blood.  Medical History: Past Medical History  Diagnosis Date  . IBS (irritable bowel syndrome)   . Diverticulitis   . Hypertension   . Fibromyalgia     Current antibiotic orders:  Ciprofloxacin 400 mg IV every 12 hours  Metronidazole 500 mg po every 8 hours  Assessment:  Asked to review the antibiotic doses ordered for this 67 year-old female with acute renal insufficiency.  Additional problems include suspected gastroenteritis, hypovolemic shock, rule out septic shock, and hyponatremia.  Goal of Therapy:  Antibiotic doses appropriate for renal function.  Plan:   Decrease the ciprofloxacin dose to 400 mg IV every 24 hours.  Continue the current metronidazole dose.  Polo Riley R.Ph. 02/23/2013 6:34 PM

## 2013-02-23 NOTE — ED Provider Notes (Signed)
CSN: [id number]     Arrival date & time 02/23/13  1122 History   First MD Initiated Contact with Patient 02/23/13 1206     Chief Complaint  Patient presents with  . Neck Pain  . Diarrhea    x 3 days  . Abdominal Pain  . Rectal Bleeding    x 2 days bright red   (Consider location/radiation/quality/duration/timing/severity/associated sxs/prior Treatment) HPI 67 year old female with PMH of Fibromyalgia, HTN, and Diverticulitis presents with 3-4 day history of neck pain/back pain, diarrhea, and recent abdominal pain and bloody stool.  Patient reports that she has been feeling poorly over the past 3-4 days.  Recently (2 days ago) she developed diffuse abdominal pain, diarrhea and hematochezia.  She states she has had several episodes of BRBPR.  She is currently weak, dizzy/lightheaded.  She continues to endorse MSK pain of neck and back.  In the ED, patient found to be hypotensive (BP 70's/30's), hypoxic, and tachycardic.  IV's placed and IV fluids started (2 NS 1000 mL boluses).    Past Medical History  Diagnosis Date  . IBS (irritable bowel syndrome)   . Diverticulitis   . Hypertension   . Fibromyalgia    Past Surgical History  Procedure Laterality Date  . Abdominal hysterectomy    . Stomach surgery     No family history on file. History  Substance Use Topics  . Smoking status: Never Smoker   . Smokeless tobacco: Not on file  . Alcohol Use: 0.6 oz/week    1 Cans of beer per week   OB History   Grav Para Term Preterm Abortions TAB SAB Ect Mult Living                 Review of Systems  Constitutional: Negative for fever and chills.  HENT: Positive for neck pain.   Respiratory: Negative for chest tightness and shortness of breath.   Cardiovascular: Negative for chest pain.  Gastrointestinal: Positive for abdominal pain and diarrhea. Negative for abdominal distention.  Genitourinary: Negative.   Musculoskeletal: Positive for myalgias and back pain.  Skin: Negative for  rash and wound.  Neurological: Positive for dizziness, light-headedness and headaches.    Allergies  Review of patient's allergies indicates no known allergies.  Home Medications   Current Outpatient Rx  Name  Route  Sig  Dispense  Refill  . ALPRAZolam (XANAX) 1 MG tablet   Oral   Take 1 mg by mouth 3 (three) times daily as needed for anxiety.         . Diphenhydramine-Pseudoephed (BENADRYL ALLERGY/SINUS PO)   Oral   Take 1 tablet by mouth 2 (two) times daily as needed (for allergies).          . furosemide (LASIX) 20 MG tablet   Oral   Take 1 tablet (20 mg total) by mouth every morning.   30 tablet      . HYDROcodone-acetaminophen (NORCO) 7.5-325 MG per tablet   Oral   Take 1 tablet by mouth every 8 (eight) hours as needed for pain (pain).          Marland Kitchen loperamide (IMODIUM) 2 MG capsule   Oral   Take 2 mg by mouth 2 (two) times daily as needed for diarrhea or loose stools.          . potassium chloride SA (K-DUR,KLOR-CON) 20 MEQ tablet   Oral   Take 20 mEq by mouth daily.         . pregabalin (  LYRICA) 100 MG capsule   Oral   Take 100 mg by mouth 2 (two) times daily.         Marland Kitchen zolpidem (AMBIEN) 10 MG tablet   Oral   Take 10 mg by mouth at bedtime as needed for sleep.          BP 83/35  Pulse 98  Temp(Src) 97.6 F (36.4 C) (Oral)  Resp 16  SpO2 97% Physical Exam  Constitutional: She appears well-developed and well-nourished. No distress.  HENT:  Head: Normocephalic and atraumatic.  Cardiovascular: Exam reveals no gallop and no friction rub.   No murmur heard. Tachycardic.   Pulmonary/Chest: Effort normal and breath sounds normal. She has no wheezes. She has no rales.  Abdominal: Soft. She exhibits no distension. There is no rebound and no guarding.  Diffusely tender to palpation.   Genitourinary:  Anorectal - No hemorrhoids noted.  Skin around anus and buttocks very erythematous and irritated. No blood noted on rectal exam.   Musculoskeletal:   Trace LE edema.   Neurological: She is alert.  Skin: Skin is warm and dry.    ED Course  Procedures (including critical care time) Labs Review Labs Reviewed  CBC WITH DIFFERENTIAL - Abnormal; Notable for the following:    WBC 13.1 (*)    Hemoglobin 10.9 (*)    HCT 33.2 (*)    Neutro Abs 10.0 (*)    All other components within normal limits  COMPREHENSIVE METABOLIC PANEL - Abnormal; Notable for the following:    Sodium 126 (*)    Chloride 85 (*)    CO2 12 (*)    Glucose, Bld 109 (*)    BUN 105 (*)    Creatinine, Ser 7.23 (*)    Calcium 8.0 (*)    Albumin 3.0 (*)    AST 46 (*)    Total Bilirubin 0.1 (*)    GFR calc non Af Amer 5 (*)    GFR calc Af Amer 6 (*)    All other components within normal limits  OCCULT BLOOD, POC DEVICE - Abnormal; Notable for the following:    Fecal Occult Bld POSITIVE (*)    All other components within normal limits  CULTURE, BLOOD (ROUTINE X 2)  CULTURE, BLOOD (ROUTINE X 2)  LIPASE, BLOOD  TROPONIN I  URINALYSIS, ROUTINE W REFLEX MICROSCOPIC  CG4 I-STAT (LACTIC ACID)  TYPE AND SCREEN  ABO/RH   Imaging Review No results found.  MDM   1. Acute renal failure   2. Diarrhea   3. Bloody stool     68 year old female with PMH of Fibromyalgia, HTN, and Diverticulitis presents with 3-4 day history of neck pain/back pain, diarrhea, and recent abdominal pain and bloody stool. - Patient hypotensive, tachycardic on arrival back. - Patient being aggressively fluid resuscitated currently.  - Stat labs sent: CBC, CMP, Lipase, Type and Screen.  Will await studies.  1415 - Hb stable.  CMP remarkable for hyponatremia and acute renal failure (BUN/Creatinine 105/7.23). FOBT positive. Patient remains hypotensive despite aggressive fluid resuscitation.  This is likely hypovolumic shock secondary to volume depletion. However, sepsis cannot be excluded at this time.  Cultures drawn.  Empiric Vanc/Zosyn ordered. CCM consulted for admission.  Tommie Sams,  DO 02/23/13 1506

## 2013-02-23 NOTE — ED Notes (Signed)
Patient transported to CT 

## 2013-02-24 ENCOUNTER — Inpatient Hospital Stay (HOSPITAL_COMMUNITY): Payer: Medicare Other

## 2013-02-24 DIAGNOSIS — E860 Dehydration: Secondary | ICD-10-CM

## 2013-02-24 DIAGNOSIS — R197 Diarrhea, unspecified: Secondary | ICD-10-CM

## 2013-02-24 DIAGNOSIS — N179 Acute kidney failure, unspecified: Secondary | ICD-10-CM

## 2013-02-24 DIAGNOSIS — F341 Dysthymic disorder: Secondary | ICD-10-CM

## 2013-02-24 LAB — URINE CULTURE
Colony Count: NO GROWTH
Culture: NO GROWTH

## 2013-02-24 LAB — HEPATIC FUNCTION PANEL
ALT: 21 U/L (ref 0–35)
Albumin: 2.2 g/dL — ABNORMAL LOW (ref 3.5–5.2)
Alkaline Phosphatase: 77 U/L (ref 39–117)
Total Bilirubin: 0.1 mg/dL — ABNORMAL LOW (ref 0.3–1.2)
Total Protein: 5.4 g/dL — ABNORMAL LOW (ref 6.0–8.3)

## 2013-02-24 LAB — BASIC METABOLIC PANEL
CO2: 14 mEq/L — ABNORMAL LOW (ref 19–32)
Calcium: 6.6 mg/dL — ABNORMAL LOW (ref 8.4–10.5)
Chloride: 105 mEq/L (ref 96–112)
Creatinine, Ser: 2.28 mg/dL — ABNORMAL HIGH (ref 0.50–1.10)
Glucose, Bld: 194 mg/dL — ABNORMAL HIGH (ref 70–99)
Sodium: 132 mEq/L — ABNORMAL LOW (ref 135–145)

## 2013-02-24 LAB — CBC
Hemoglobin: 9.7 g/dL — ABNORMAL LOW (ref 12.0–15.0)
MCH: 27.2 pg (ref 26.0–34.0)
MCV: 84.3 fL (ref 78.0–100.0)
Platelets: 302 10*3/uL (ref 150–400)
RBC: 3.56 MIL/uL — ABNORMAL LOW (ref 3.87–5.11)
WBC: 10.9 10*3/uL — ABNORMAL HIGH (ref 4.0–10.5)

## 2013-02-24 LAB — CORTISOL: Cortisol, Plasma: 16.8 ug/dL

## 2013-02-24 LAB — CALCIUM, IONIZED: Calcium, Ion: 1.11 mmol/L — ABNORMAL LOW (ref 1.13–1.30)

## 2013-02-24 MED ORDER — POTASSIUM CHLORIDE CRYS ER 20 MEQ PO TBCR: 40.0000 meq | EXTENDED_RELEASE_TABLET | Freq: Two times a day (BID) | ORAL | Status: DC

## 2013-02-24 MED ORDER — SODIUM CHLORIDE 0.9 % IV SOLN
INTRAVENOUS | Status: DC
  Administered 2013-02-24: 12:00:00 via INTRAVENOUS
  Administered 2013-02-25: 1000 mL via INTRAVENOUS

## 2013-02-24 MED ORDER — SODIUM CHLORIDE 0.9 % IV SOLN
1.0000 g | Freq: Once | INTRAVENOUS | Status: AC
  Administered 2013-02-24: 1 g via INTRAVENOUS
  Filled 2013-02-24: qty 10

## 2013-02-24 MED ORDER — ADULT MULTIVITAMIN W/MINERALS CH
1.0000 | ORAL_TABLET | Freq: Every day | ORAL | Status: DC
  Administered 2013-02-24 – 2013-02-25 (×2): 1 via ORAL
  Filled 2013-02-24 (×3): qty 1

## 2013-02-24 MED ORDER — FENTANYL CITRATE 0.05 MG/ML IJ SOLN
12.5000 ug | INTRAMUSCULAR | Status: DC | PRN
  Administered 2013-02-24 – 2013-02-25 (×5): 25 ug via INTRAVENOUS
  Filled 2013-02-24 (×5): qty 2

## 2013-02-24 MED ORDER — ENSURE COMPLETE PO LIQD: 237.0000 mL | Freq: Two times a day (BID) | ORAL | Status: DC | PRN

## 2013-02-24 MED ORDER — CALCIUM GLUCONATE 10 % IV SOLN
1.0000 g | Freq: Once | INTRAVENOUS | Status: AC
Start: 2013-02-24 — End: 2013-02-24
  Administered 2013-02-24: 1 g via INTRAVENOUS
  Filled 2013-02-24: qty 10

## 2013-02-24 MED ORDER — HYDROCORTISONE SOD SUCCINATE 100 MG IJ SOLR
50.0000 mg | Freq: Four times a day (QID) | INTRAMUSCULAR | Status: DC
  Administered 2013-02-24 – 2013-02-25 (×3): 50 mg via INTRAVENOUS
  Filled 2013-02-24: qty 2
  Filled 2013-02-24: qty 1
  Filled 2013-02-24 (×2): qty 2
  Filled 2013-02-24 (×3): qty 1

## 2013-02-24 MED ORDER — MAGNESIUM SULFATE 40 MG/ML IJ SOLN
2.0000 g | Freq: Once | INTRAMUSCULAR | Status: AC
  Administered 2013-02-24: 2 g via INTRAVENOUS
  Filled 2013-02-24: qty 50

## 2013-02-24 MED ORDER — POTASSIUM CHLORIDE CRYS ER 20 MEQ PO TBCR
40.0000 meq | EXTENDED_RELEASE_TABLET | Freq: Once | ORAL | Status: AC
  Administered 2013-02-24: 40 meq via ORAL
  Filled 2013-02-24: qty 2

## 2013-02-24 MED ORDER — SODIUM CHLORIDE 0.9 % IV BOLUS (SEPSIS)
750.0000 mL | Freq: Once | INTRAVENOUS | Status: AC
  Administered 2013-02-24: 750 mL via INTRAVENOUS

## 2013-02-24 NOTE — H&P (Signed)
PULMONARY  / CRITICAL CARE MEDICINE  Name: [name] MRN: [id number] DOB: [birth year]    ADMISSION DATE:  02/23/2013  REFERRING MD :  EDP PRIMARY SERVICE: PCCM  REASON FOR ADMISSION: persistent hypotension after volume resuscitation  BRIEF PATIENT DESCRIPTION: 3 F with hx of IBS admitted via [facility] ED with 2-3 wks of N/V, anorexia, severe diarrhea and 2-3 days of weakness, HA. Remained hypotensive despite 4 liters NS in ED.  Admitted with suspected gastroenteritis, hypovolemic shock, r/o septic shock. ARI, hyponatremia  83F with PMH of IBS, chronic diarrhea, duodenal stricture repair presented to ED as noted above. She remained hypotensive despite 4L NS. Also c/o blood on tissue after wiping. Describes stools as watery but not frankly bloody. Has recently been started on an anti-hypertensive medication. Unsure name of it. Describes profund weakness on day of admission. Also c/o worsening HA over past 2-3 days. Denies fever, chills, sweats, CP, cough, SOB, sputum, hemoptysis, photophobia, nuchal rigidity. Has chronic intermittent LE edema - not worse than baseline.    SIGNIFICANT EVENTS / STUDIES:    LINES / TUBES: L IJ CVL 8/26 >>   CULTURES: C diff 8/26 >>  NEG Stool 8/26 >>  Urine 8/26 >>  Blood 8/26 >>      ANTIBIOTICS: Ciprofloxacin 8/26 >>  Metronidazole (PO) 8/26 >>     SUBJECTIVE/OVERNIGHT/INTERVAL HX 8./28 - c diff negative. Still on pressors. Hungry and wants to eat. C/o left arm pain and many somatic complaints. CVP 14.    VITAL SIGNS: Temp:  [94.5 F (34.7 C)-99 F (37.2 C)] 98.4 F (36.9 C) (08/28 1000) Pulse Rate:  [67-108] 93 (08/28 1000) Resp:  [6-23] 16 (08/28 1000) BP: (67-133)/(21-84) 95/29 mmHg (08/28 1000) SpO2:  [72 %-100 %] 93 % (08/28 1000) Weight:  [86.3 kg (190 lb 4.1 oz)-88.9 kg (195 lb 15.8 oz)] 86.3 kg (190 lb 4.1 oz) (08/28 0403) HEMODYNAMICS: CVP:  [10 mmHg-15 mmHg] 14 mmHg VENTILATOR SETTINGS:   INTAKE / OUTPUT: Intake/Output   08/27 0701 - 08/28 0700 08/28 0701 - 08/29 0700   P.O. 1200    I.V. (mL/kg) 7114.2 (82.4) 460.7 (5.3)   Total Intake(mL/kg) 8314.2 (96.3) 460.7 (5.3)   Urine (mL/kg/hr) 3145 1250 (3.7)   Total Output 3145 1250   Net +5169.2 -789.3        Stool Occurrence 1 x      PHYSICAL EXAMINATION: General:  Chronicially ikk Pyshch: Flat affect, aNzious Neuro:  CNs intact, motor/sens grossly intact, DTRs symmetric HEENT: NCAT, EOMI, PERRL Cardiovascular: RRR s M Lungs: CTAP Abdomen: firm, not rigid, dull to perc, NABS, no HSM, mildly diffusely tender without rebound or guarding Ext: cool, diminished DP pulses, no edema Skin: no rashes, lesions MSK: No restriction to passive range of LUE and pulses well felt  LABS: PULMONARY No results found for this basename: PHART, PCO2, PCO2ART, PO2, PO2ART, HCO3, TCO2, O2SAT,  in the last 168 hours  CBC  Recent Labs Lab 02/23/13 1232 02/24/13 0410  HGB 10.9* 9.7*  HCT 33.2* 30.0*  WBC 13.1* 10.9*  PLT 346 302    COAGULATION No results found for this basename: INR,  in the last 168 hours  CARDIAC   Recent Labs Lab 02/23/13 1232  TROPONINI <0.30   No results found for this basename: PROBNP,  in the last 168 hours   CHEMISTRY  Recent Labs Lab 02/23/13 1232 02/23/13 2000 02/24/13 0410  NA 126* 131* 132*  K 4.2 3.8 3.4*  CL 85* 101 105  CO2  12* 13* 14*  GLUCOSE 109* 82 194*  BUN 105* 82* 64*  CREATININE 7.23* 3.93* 2.28*  CALCIUM 8.0* 6.6* 6.6*   Estimated Creatinine Clearance: 25 ml/min (by C-G formula based on Cr of 2.28).   LIVER  Recent Labs Lab 02/23/13 1232 02/24/13 0410  AST 46* 34  ALT 25 21  ALKPHOS 116 77  BILITOT 0.1* 0.1*  PROT 6.7 5.4*  ALBUMIN 3.0* 2.2*     INFECTIOUS  Recent Labs Lab 02/23/13 1200 02/23/13 1358 02/23/13 2000 02/24/13 0410  LATICACIDVEN  --  2.14 0.5  --   PROCALCITON 6.21  --   --  1.06     ENDOCRINE CBG (last 3)  No results found for this basename: GLUCAP,  in the  last 72 hours       IMAGING x48h  Ct Abdomen Pelvis Wo Contrast  02/23/2013   *RADIOLOGY REPORT*  Clinical Data: Diarrhea and abdominal pain  CT ABDOMEN AND PELVIS WITHOUT CONTRAST  Technique:  Multidetector CT imaging of the abdomen and pelvis was performed following the standard protocol without intravenous contrast.  Comparison: 10/14/2012  Findings: The lung bases are free of acute infiltrate or sizable effusion.  The liver and spleen are stable in appearance from prior exam.  The gallbladder is significantly dilated however no pericholecystic fluid or wall thickening is noted.  No definitive cholelithiasis is seen.  No extrahepatic biliary ductal dilatation is noted.  The adrenal glands and pancreas are within normal limits.  The kidneys are well visualized bilaterally.  No renal calculi or obstructive changes are noted.  A Foley catheter is decompressed the bladder.  The appendix is not well visualized may been surgically removed.  Postsurgical changes in the stomach are again seen.  IMPRESSION: Dilated gallbladder although no wall thickening or pericholecystic fluid is noted.  No cholelithiasis is seen.  No acute abnormality is noted.   Original Report Authenticated By: Alcide Clever, M.D.   Dg Abd 1 View  02/23/2013   *RADIOLOGY REPORT*  Clinical Data: Abdominal pain, weakness  ABDOMEN - 1 VIEW  Comparison: CT abdomen pelvis - 10/14/2012  Findings:  There is mild nonspecific gaseous distension of a loop of small bowel within the midhemiabdomen measuring approximately 4.3 cm in diameter.  This finding is associated with a paucity of distal colonic gas.  No supine evidence of pneumoperitoneum.  No definite pneumatosis or portal venous gas.  No definite abnormal intra-abdominal calcifications.  Multilevel thoracolumbar spine degenerative change suspected.  IMPRESSION: Mild gaseous distension of a loop of small bowel left mid hemiabdomen, nonspecific but could be seen in the setting of early small  bowel obstruction.  Clinical correlation is advised.   Original Report Authenticated By: Tacey Ruiz, MD   Dg Chest Portable 1 View  02/23/2013   *RADIOLOGY REPORT*  Clinical Data: Neck pain.  Diarrhea.  Abdominal pain.  Rectal bleeding.  PORTABLE CHEST - 1 VIEW  Comparison: Chest x-ray 03/29/2009.  Findings: There is a left-sided internal jugular central venous catheter with tip terminating in the proximal superior vena cava. Lung volumes are slightly low.  No acute consolidative air space disease.  No pleural effusions.  No evidence of pulmonary edema. Heart size is within normal limits.  Upper mediastinal contours are within normal limits for the portable supine technique.  No pneumothorax.  IMPRESSION: 1.  No radiographic evidence of acute cardiopulmonary disease. 2.  Left internal jugular central venous catheter with tip in the proximal superior vena cava.   Original Report Authenticated  By: Trudie Reed, M.D.       ASSESSMENT / PLAN:  PULMONARY A: No issues P:   Supplemental O2 as needed  CARDIOVASCULAR A: H/O hypertension Hypotension, presumed hypovolemic - r/o septic P:  CVP goal 8-12 Phenylephrine to maintain MAP > 60 mmHg Change d5 normal saline to normal saline  RENAL A:  Acute renal failure, nonoliguric - should resolve with resuscitation Metabolic acidosis Hyponatremia  8/28 - improving. Low calcium  P:   Volume resuscitate Need to determine what anti-hypertensive was recently started (?ACEI or ARB) Monitor BMET intermittently Correct electrolytes as indicated Check ionizec calcium; Rx with calcium gluconate Check mag and phos   GASTROINTESTINAL A:  H/O IBS Nausea, anorexia Profound diarrhea Heme+ stool (card +, not grossly) - suspect from external irritation   02/24/13 - diarrhea better. C diff neative P:   Per gi - for scope 8/29   HEMATOLOGIC A:  No issues P:  Monitor CBC intermittently  INFECTIOUS A:  Suspected gastroenteritis Suspected  severe sepsis P:   PCT algorithm Micro and abx as above  ENDOCRINE A:  Reported history of hypothyroidism. Not on levothyroxine P:   TSH in AM 8/28  NEUROLOGIC A: Chronic pain P:   Change to fenanyl due to renal failure  The patient is critically ill with multiple organ systems failure and requires high complexity decision making for assessment and support, frequent evaluation and titration of therapies, application of advanced monitoring technologies and extensive interpretation of multiple databases.   Critical Care Time devoted to patient care services described in this note is  31  Minutes.  Dr. Kalman Shan, M.D., [facility].C.P Pulmonary and Critical Care Medicine Staff Physician [facility]  Pulmonary and Critical Care Pager: [phone], If no answer or between  15:00h - 7:00h: call [phone]  02/24/2013 11:07 AM

## 2013-02-24 NOTE — Plan of Care (Signed)
Problem: Consults Goal: Skin Care Protocol Initiated - if Braden Score 18 or less If consults are not indicated, leave blank or document N/A Outcome: Completed/Met Date Met:  02/24/13 Sacral dressing

## 2013-02-24 NOTE — Progress Notes (Signed)
eLink Physician-Brief Progress Note Patient Name: [name] DOB: [birth year] MRN: [id number]  Date of Service  02/24/2013   HPI/Events of Note     eICU Interventions  .hypocalcemia/ hypomag - repleted   Intervention Category Intermediate Interventions: Electrolyte abnormality - evaluation and management  ALVA,RAKESH V. 02/24/2013, 6:56 PM

## 2013-02-24 NOTE — Progress Notes (Signed)
INITIAL NUTRITION ASSESSMENT  DOCUMENTATION CODES Per approved criteria  -Obesity Unspecified   INTERVENTION: Provide Ensure Complete BID as needed Provide Multivitamin with minerals daily  NUTRITION DIAGNOSIS: Inadequate oral intake related to nausea as evidenced by pt's reports of not eating for the past 5 days.   Goal: Pt to meet >/= 90% of their estimated nutrition needs   Monitor:  PO intake Weight Labs  Reason for Assessment: Malnutrition Screening Tool (MST) score of 3  67 y.o. female  Admitting Dx: persistent hypotension after volume resuscitation   ASSESSMENT: 67 year old obese female with history of IBS, diverticulitis, and hypertension presents with complaints of diarrhea, nausea, and vomiting. She did relatively well until several weeks ago when she began to have a poor appetite and then, several days ago, began having diarrhea which has been fairly continuous in character. She's also had some vomiting shortly after meals, with trouble keeping food down, although that particular symptom seems to be better at the moment. Admitted with suspected gastroenteritis, hypovolemic shock, r/o septic shock. ARI, hyponatremia.  Pt reports that she has not eaten any solid food for the past 5 days except for a small amount of applesauce at breakfast today. She reports poor po intake for the past several weeks due to nausea, vomiting, diarrhea, and abdominal discomfort. Pt states that was recently trying to lose weight- she usually weighs 180 lbs and lost down to 170 lbs. Pt would like to receive nutritional supplements until appetite improves.   Height: Ht Readings from Last 1 Encounters:  02/23/13 5' 1.81" (1.57 m)    Weight: Wt Readings from Last 1 Encounters:  02/24/13 190 lb 4.1 oz (86.3 kg)    Ideal Body Weight: 110 lbs  % Ideal Body Weight: 173%  Wt Readings from Last 10 Encounters:  02/24/13 190 lb 4.1 oz (86.3 kg)  10/15/12 195 lb 12.3 oz (88.8 kg)    Usual  Body Weight: 180 lbs (per pt report)  % Usual Body Weight: 106%  BMI:  Body mass index is 35.01 kg/(m^2).  Estimated Nutritional Needs: Kcal: 1700-1900 Protein: 70-80 grams Fluid: 2.5 L/ day  Skin: red peri area of buttocks; intact  Diet Order: Parke Simmers  EDUCATION NEEDS: -No education needs identified at this time   Intake/Output Summary (Last 24 hours) at 02/24/13 1052 Last data filed at 02/24/13 1000  Gross per 24 hour  Intake 8774.91 ml  Output   4395 ml  Net 4379.91 ml    Last BM: 8/27  Labs:   Recent Labs Lab 02/23/13 1232 02/23/13 2000 02/24/13 0410  NA 126* 131* 132*  K 4.2 3.8 3.4*  CL 85* 101 105  CO2 12* 13* 14*  BUN 105* 82* 64*  CREATININE 7.23* 3.93* 2.28*  CALCIUM 8.0* 6.6* 6.6*  GLUCOSE 109* 82 194*    CBG (last 3)  No results found for this basename: GLUCAP,  in the last 72 hours  Scheduled Meds: . antiseptic oral rinse  15 mL Mouth Rinse BID  . ciprofloxacin  400 mg Intravenous Q24H  . metroNIDAZOLE  500 mg Oral Q8H  . pantoprazole (PROTONIX) IV  40 mg Intravenous Q24H  . saccharomyces boulardii  250 mg Oral BID    Continuous Infusions: . dextrose 5 % and 0.9% NaCl 100 mL/hr at 02/24/13 0535  . phenylephrine (NEO-SYNEPHRINE) Adult infusion 100 mcg/min (02/24/13 1000)    Past Medical History  Diagnosis Date  . IBS (irritable bowel syndrome)   . Diverticulitis   . Hypertension   .  Fibromyalgia     Past Surgical History  Procedure Laterality Date  . Abdominal hysterectomy    . Stomach surgery      Ian Malkin RD, LDN Inpatient Clinical Dietitian Pager: [phone] After Hours Pager: [phone]

## 2013-02-24 NOTE — Progress Notes (Signed)
[facility] PROGRESS NOTE Subjective Pt eating breakfast but reports that she has nauseated and has no appetite. She had diarrhea all during the night. She has many complaints today some of which are G.I. These are primarily nausea and continuing diarrhea. Her arm hurts her leg hurts she has a headache etc.  Objective: Vital signs in last 24 hours: Temp:  [94.5 F (34.7 C)-99 F (37.2 C)] 98.4 F (36.9 C) (08/28 0845) Pulse Rate:  [67-108] 89 (08/28 0845) Resp:  [6-23] 16 (08/28 0845) BP: (67-133)/(21-84) 124/61 mmHg (08/28 0830) SpO2:  [72 %-100 %] 96 % (08/28 0845) Weight:  [86.3 kg (190 lb 4.1 oz)-88.9 kg (195 lb 15.8 oz)] 86.3 kg (190 lb 4.1 oz) (08/28 0403) Last BM Date: 02/23/13  Intake/Output from previous day: 08/27 0701 - 08/28 0700 In: 8148.6 [P.O.:1200; I.V.:6948.6] Out: 3145 [Urine:3145] Intake/Output this shift:    PE: General-- obese white female eating breakfast in no acute distress Heart-- regular rate and rhythm without murmurs are gallops Lungs--clear Abdomen-- slightly distended and generally nontender  Lab Results:  Recent Labs  02/23/13 1232 02/24/13 0410  WBC 13.1* 10.9*  HGB 10.9* 9.7*  HCT 33.2* 30.0*  PLT 346 302   BMET  Recent Labs  02/23/13 1232 02/23/13 2000 02/24/13 0410  NA 126* 131* 132*  K 4.2 3.8 3.4*  CL 85* 101 105  CO2 12* 13* 14*  CREATININE 7.23* 3.93* 2.28*   LFT  Recent Labs  02/23/13 1232 02/24/13 0410  PROT 6.7 5.4*  AST 46* 34  ALT 25 21  ALKPHOS 116 77  BILITOT 0.1* 0.1*  BILIDIR  --  <0.1  IBILI  --  NOT CALCULATED   PT/INR No results found for this basename: LABPROT, INR,  in the last 72 hours PANCREAS  Recent Labs  02/23/13 1232  LIPASE 43         Studies/Results: Ct Abdomen Pelvis Wo Contrast  02/23/2013   *RADIOLOGY REPORT*  Clinical Data: Diarrhea and abdominal pain  CT ABDOMEN AND PELVIS WITHOUT CONTRAST  Technique:  Multidetector CT imaging of the abdomen and pelvis was  performed following the standard protocol without intravenous contrast.  Comparison: 10/14/2012  Findings: The lung bases are free of acute infiltrate or sizable effusion.  The liver and spleen are stable in appearance from prior exam.  The gallbladder is significantly dilated however no pericholecystic fluid or wall thickening is noted.  No definitive cholelithiasis is seen.  No extrahepatic biliary ductal dilatation is noted.  The adrenal glands and pancreas are within normal limits.  The kidneys are well visualized bilaterally.  No renal calculi or obstructive changes are noted.  A Foley catheter is decompressed the bladder.  The appendix is not well visualized may been surgically removed.  Postsurgical changes in the stomach are again seen.  IMPRESSION: Dilated gallbladder although no wall thickening or pericholecystic fluid is noted.  No cholelithiasis is seen.  No acute abnormality is noted.   Original Report Authenticated By: Alcide Clever, M.D.   Dg Abd 1 View  02/23/2013   *RADIOLOGY REPORT*  Clinical Data: Abdominal pain, weakness  ABDOMEN - 1 VIEW  Comparison: CT abdomen pelvis - 10/14/2012  Findings:  There is mild nonspecific gaseous distension of a loop of small bowel within the midhemiabdomen measuring approximately 4.3 cm in diameter.  This finding is associated with a paucity of distal colonic gas.  No supine evidence of pneumoperitoneum.  No definite pneumatosis or portal venous gas.  No definite abnormal intra-abdominal calcifications.  Multilevel thoracolumbar spine degenerative change suspected.  IMPRESSION: Mild gaseous distension of a loop of small bowel left mid hemiabdomen, nonspecific but could be seen in the setting of early small bowel obstruction.  Clinical correlation is advised.   Original Report Authenticated By: Tacey Ruiz, MD   Dg Chest Portable 1 View  02/23/2013   *RADIOLOGY REPORT*  Clinical Data: Neck pain.  Diarrhea.  Abdominal pain.  Rectal bleeding.  PORTABLE CHEST - 1  VIEW  Comparison: Chest x-ray 03/29/2009.  Findings: There is a left-sided internal jugular central venous catheter with tip terminating in the proximal superior vena cava. Lung volumes are slightly low.  No acute consolidative air space disease.  No pleural effusions.  No evidence of pulmonary edema. Heart size is within normal limits.  Upper mediastinal contours are within normal limits for the portable supine technique.  No pneumothorax.  IMPRESSION: 1.  No radiographic evidence of acute cardiopulmonary disease. 2.  Left internal jugular central venous catheter with tip in the proximal superior vena cava.   Original Report Authenticated By: Trudie Reed, M.D.    Medications: I have reviewed the patient's current medications.  Assessment/Plan: 1. Diarrhea. The cause of this is unclear and she has had this for some time. I think in sigmoidoscopy with: biopsies would be appropriate. We'll also obtain G.I. pathogen panel. Sigmoidoscopy scheduled for tomorrow morning. 2. Nausea. Probably multifactorial 3. Acute renal failure probably due to dehydration from diarrhea and knowledge. Creatinine improving with IV fluids  Plan: will proceed with unprepped sigmoidoscopy at 8 o'clock tomorrow have discussed with the patient.   Aaleyah Witherow JR,Biance Moncrief L 02/24/2013, 9:26 AM

## 2013-02-24 NOTE — Progress Notes (Signed)
Visited patient per nurse referral. Offered support and explained services. Patient told me she was in the hospital in April of this year. 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 She has reading glasses and requires them to read controls on bed and TV. Helped her find her glasses. She also asked me to call her pastor at [facility] and let him know she wanted visitation and prayer. Contacted her church per her request. She is married and her spouse is local. Her other family is away at the moment.

## 2013-02-24 NOTE — ED Provider Notes (Signed)
I saw and evaluated the patient, reviewed the resident's note and I agree with the findings and plan.  Please see completed note.   Raeford Razor, MD 02/24/13 1021

## 2013-02-24 NOTE — Significant Event (Signed)
Borderline blood pressure.  Will give additional fluid bolus.  Coralyn Helling, MD 02/24/2013, 2:16 AM

## 2013-02-25 ENCOUNTER — Inpatient Hospital Stay (HOSPITAL_COMMUNITY): Payer: Medicare Other

## 2013-02-25 ENCOUNTER — Encounter (HOSPITAL_COMMUNITY)
Admission: EM | Disposition: A | Discharge status: Skilled Nursing Facility | Payer: Self-pay | Source: Home / Self Care | Attending: Pulmonary Disease

## 2013-02-25 DIAGNOSIS — K921 Melena: Secondary | ICD-10-CM

## 2013-02-25 DIAGNOSIS — J9600 Acute respiratory failure, unspecified whether with hypoxia or hypercapnia: Secondary | ICD-10-CM

## 2013-02-25 LAB — CBC WITH DIFFERENTIAL/PLATELET
Basophils Absolute: 0 10*3/uL (ref 0.0–0.1)
Eosinophils Relative: 0 % (ref 0–5)
HCT: 28.1 % — ABNORMAL LOW (ref 36.0–46.0)
Hemoglobin: 9.2 g/dL — ABNORMAL LOW (ref 12.0–15.0)
Lymphocytes Relative: 15 % (ref 12–46)
Lymphs Abs: 0.9 10*3/uL (ref 0.7–4.0)
MCV: 84.9 fL (ref 78.0–100.0)
Monocytes Absolute: 0.3 10*3/uL (ref 0.1–1.0)
Monocytes Relative: 5 % (ref 3–12)
Neutro Abs: 4.6 10*3/uL (ref 1.7–7.7)
RBC: 3.31 MIL/uL — ABNORMAL LOW (ref 3.87–5.11)
RDW: 16.2 % — ABNORMAL HIGH (ref 11.5–15.5)
WBC: 5.8 10*3/uL (ref 4.0–10.5)

## 2013-02-25 LAB — BASIC METABOLIC PANEL
CO2: 18 mEq/L — ABNORMAL LOW (ref 19–32)
Chloride: 115 mEq/L — ABNORMAL HIGH (ref 96–112)
Creatinine, Ser: 0.83 mg/dL (ref 0.50–1.10)
GFR calc Af Amer: 84 mL/min — ABNORMAL LOW (ref >90)
Potassium: 4.4 mEq/L (ref 3.5–5.1)

## 2013-02-25 LAB — PHOSPHORUS: Phosphorus: 1.8 mg/dL — ABNORMAL LOW (ref 2.3–4.6)

## 2013-02-25 LAB — TROPONIN I
Troponin I: <0.3 ng/mL (ref <0.30)
Troponin I: 1.39 ng/mL (ref <0.30)

## 2013-02-25 LAB — LACTIC ACID, PLASMA: Lactic Acid, Venous: 0.7 mmol/L (ref 0.5–2.2)

## 2013-02-25 LAB — PROCALCITONIN: Procalcitonin: 0.24 ng/mL

## 2013-02-25 LAB — CK TOTAL AND CKMB (NOT AT ARMC)
CK, MB: 9.6 ng/mL (ref 0.3–4.0)
Relative Index: 2.4 (ref 0.0–2.5)

## 2013-02-25 LAB — MAGNESIUM: Magnesium: 2.1 mg/dL (ref 1.5–2.5)

## 2013-02-25 SURGERY — SIGMOIDOSCOPY, FLEXIBLE
Anesthesia: Moderate Sedation

## 2013-02-25 MED ORDER — CIPROFLOXACIN HCL 500 MG PO TABS
500.0000 mg | ORAL_TABLET | Freq: Two times a day (BID) | ORAL | Status: AC
  Administered 2013-02-25 – 2013-02-28 (×8): 500 mg via ORAL
  Filled 2013-02-25 (×9): qty 1

## 2013-02-25 MED ORDER — FENTANYL CITRATE 0.05 MG/ML IJ SOLN
50.0000 ug | Freq: Once | INTRAMUSCULAR | Status: AC
  Administered 2013-02-25: 50 ug via INTRAVENOUS

## 2013-02-25 MED ORDER — ASPIRIN EC 81 MG PO TBEC
81.0000 mg | DELAYED_RELEASE_TABLET | Freq: Every day | ORAL | Status: DC
  Filled 2013-02-25 (×2): qty 1

## 2013-02-25 MED ORDER — ALBUTEROL SULFATE (5 MG/ML) 0.5% IN NEBU
INHALATION_SOLUTION | RESPIRATORY_TRACT | Status: AC
  Administered 2013-02-25: 23:00:00
  Filled 2013-02-25: qty 0.5

## 2013-02-25 MED ORDER — LORAZEPAM 2 MG/ML IJ SOLN
1.0000 mg | Freq: Once | INTRAMUSCULAR | Status: AC
  Administered 2013-02-25: 1 mg via INTRAVENOUS

## 2013-02-25 MED ORDER — PROPOFOL 10 MG/ML IV EMUL
5.0000 ug/kg/min | INTRAVENOUS | Status: DC
  Administered 2013-02-26: 30 ug/kg/min via INTRAVENOUS
  Administered 2013-02-26: 5 ug/kg/min via INTRAVENOUS
  Administered 2013-02-26: 20 ug/kg/min via INTRAVENOUS
  Administered 2013-02-26: 30 ug/kg/min via INTRAVENOUS
  Administered 2013-02-26: 40 ug/kg/min via INTRAVENOUS
  Administered 2013-02-27: 50 ug/kg/min via INTRAVENOUS
  Administered 2013-02-27: 21 ug/kg/min via INTRAVENOUS
  Administered 2013-02-27 – 2013-02-28 (×4): 50 ug/kg/min via INTRAVENOUS
  Administered 2013-02-28 (×2): 40 ug/kg/min via INTRAVENOUS
  Administered 2013-02-28: 50 ug/kg/min via INTRAVENOUS
  Administered 2013-02-28: 40 ug/kg/min via INTRAVENOUS
  Administered 2013-02-28: 50 ug/kg/min via INTRAVENOUS
  Administered 2013-03-01: 40 ug/kg/min via INTRAVENOUS
  Filled 2013-02-25 (×20): qty 100

## 2013-02-25 MED ORDER — SODIUM PHOSPHATE 3 MMOLE/ML IV SOLN
30.0000 mmol | Freq: Once | INTRAVENOUS | Status: DC
  Filled 2013-02-25: qty 10

## 2013-02-25 MED ORDER — LORAZEPAM 2 MG/ML IJ SOLN
INTRAMUSCULAR | Status: AC
  Filled 2013-02-25: qty 1

## 2013-02-25 MED ORDER — METHYLPREDNISOLONE SODIUM SUCC 40 MG IJ SOLR
INTRAMUSCULAR | Status: AC
  Administered 2013-02-25: 40 mg via INTRAVENOUS
  Filled 2013-02-25: qty 1

## 2013-02-25 MED ORDER — ETOMIDATE 2 MG/ML IV SOLN
20.0000 mg | Freq: Once | INTRAVENOUS | Status: AC
  Administered 2013-02-25: 20 mg via INTRAVENOUS
  Filled 2013-02-25: qty 10

## 2013-02-25 MED ORDER — PROPOFOL 10 MG/ML IV EMUL
INTRAVENOUS | Status: AC
  Administered 2013-02-26: 1000 mg
  Filled 2013-02-25: qty 100

## 2013-02-25 MED ORDER — RACEPINEPHRINE HCL 2.25 % IN NEBU
0.5000 mL | INHALATION_SOLUTION | Freq: Once | RESPIRATORY_TRACT | Status: DC
  Filled 2013-02-25: qty 0.5

## 2013-02-25 MED ORDER — SODIUM CHLORIDE 0.9 % IV SOLN
25.0000 ug/h | INTRAVENOUS | Status: DC
  Administered 2013-02-26: 100 ug/h via INTRAVENOUS
  Administered 2013-02-26 (×2): 150 ug/h via INTRAVENOUS
  Administered 2013-02-26: 100 ug/h via INTRAVENOUS
  Administered 2013-02-27: 150 ug/h via INTRAVENOUS
  Administered 2013-02-27: 100 ug/h via INTRAVENOUS
  Administered 2013-02-27: 150 ug/h via INTRAVENOUS
  Administered 2013-02-28 – 2013-03-01 (×3): 200 ug/h via INTRAVENOUS
  Filled 2013-02-25 (×7): qty 50

## 2013-02-25 MED ORDER — IPRATROPIUM BROMIDE 0.02 % IN SOLN
0.5000 mg | Freq: Four times a day (QID) | RESPIRATORY_TRACT | Status: DC | PRN
  Administered 2013-02-26: 0.5 mg via RESPIRATORY_TRACT
  Filled 2013-02-25: qty 2.5

## 2013-02-25 MED ORDER — FENTANYL BOLUS VIA INFUSION
25.0000 ug | Freq: Four times a day (QID) | INTRAVENOUS | Status: DC | PRN
  Filled 2013-02-25: qty 100

## 2013-02-25 MED ORDER — ZOLPIDEM TARTRATE 5 MG PO TABS: 5.0000 mg | ORAL_TABLET | Freq: Every evening | ORAL | Status: DC | PRN

## 2013-02-25 MED ORDER — IPRATROPIUM BROMIDE 0.02 % IN SOLN
RESPIRATORY_TRACT | Status: AC
  Administered 2013-02-25: 23:00:00
  Filled 2013-02-25: qty 2.5

## 2013-02-25 MED ORDER — CALCIUM CARBONATE ANTACID 500 MG PO CHEW
1.0000 | CHEWABLE_TABLET | Freq: Three times a day (TID) | ORAL | Status: DC | PRN
  Administered 2013-02-25: 200 mg via ORAL
  Filled 2013-02-25: qty 1

## 2013-02-25 MED ORDER — LEVALBUTEROL HCL 0.63 MG/3ML IN NEBU
0.6300 mg | INHALATION_SOLUTION | Freq: Four times a day (QID) | RESPIRATORY_TRACT | Status: DC | PRN
  Administered 2013-02-26: 0.63 mg via RESPIRATORY_TRACT
  Filled 2013-02-25 (×2): qty 3

## 2013-02-25 MED ORDER — HYDROCODONE-ACETAMINOPHEN 5-325 MG PO TABS
1.0000 | ORAL_TABLET | Freq: Four times a day (QID) | ORAL | Status: DC | PRN
  Administered 2013-02-25: 1 via ORAL
  Filled 2013-02-25: qty 1

## 2013-02-25 NOTE — Progress Notes (Signed)
Troponin level came back critical at 1.39. Elink MD called. Vital signs takenn HR 123, BP 149/94. 95% on RA. Orders placed to transfer patient to telemetry and obtain EKG.

## 2013-02-25 NOTE — Progress Notes (Signed)
Earlier this evening, discovered 4 ambien tablets in patient bed.  At bedside, found an ambien pill bottle with meds in it and unlabeled meds in a pill box as well as some meds located in a zipper pouch.  All meds taken from patient and counted and labeled with 3 RNs cluding the [facility] and charge RN.  Medications sent to pharmacy per protocol and med sheet placed on front of chart. Reminded patient that all meds are to be sent home or to the pharmacy and not left at patient bedside

## 2013-02-25 NOTE — Progress Notes (Signed)
Physician Discharge Summary   error

## 2013-02-25 NOTE — Progress Notes (Signed)
eLink Physician-Brief Progress Note Patient Name: [name] DOB: 22-Dec-1945 MRN: [id number]  Date of Service  02/25/2013   HPI/Events of Note  Patient with atypical CP now with abnormal trop of 1.39.  HD stable.  Wants to go home - attempted to go AMA today but convinced to stay.   eICU Interventions  Plan: Transfer to telemetry for monitoring EKG ASA - consider BB Cycle trop   Intervention Category Minor Interventions: Clinical assessment - ordering diagnostic tests  DETERDING,ELIZABETH 02/25/2013, 10:20 PM

## 2013-02-25 NOTE — Progress Notes (Signed)
eLink Physician-Brief Progress Note Patient Name: [name] DOB: June 04, 1946 MRN: [id number]  Date of Service  02/25/2013   HPI/Events of Note   Hypophosphatemia  eICU Interventions  Phos replaced   Intervention Category Intermediate Interventions: Electrolyte abnormality - evaluation and management  DETERDING,ELIZABETH 02/25/2013, 5:58 AM

## 2013-02-25 NOTE — Progress Notes (Addendum)
Critical Care Update Note:  Called to bedside due to respiratory distress. On my arrival patient's RR 35, HR 160's, O2sat 91 on neb. Sat dropped to 79 on [place] and improved on 100 % NRBM.  Stridor on physical exam. Transferred to ICU and intubated due to acute hypoxic respiratory failure mild swelling of upper airway noted on intubation. 7.5 ETT used on 1st attempt. Unclear etiology of process. Allergic reaction?  Solu-Medrol 40 mg IV push given. Nebulizer treatment given.  Refugio Mcconico

## 2013-02-25 NOTE — Progress Notes (Signed)
[facility] PROGRESS NOTE Subjective Pt was scheduled for unprepped sigmoid to evaluate the diarrhea. This AM is c/o severe chest pain. Noted that sleeping pills found in her bed. Pt crying wants to go home.  Objective: Vital signs in last 24 hours: Temp:  [97.5 F (36.4 C)-99 F (37.2 C)] 98.2 F (36.8 C) (08/29 0700) Pulse Rate:  [87-109] 104 (08/29 0700) Resp:  [10-22] 22 (08/29 0700) BP: (89-137)/(29-79) 127/65 mmHg (08/29 0700) SpO2:  [91 %-97 %] 96 % (08/29 0700) Weight:  [87.4 kg (192 lb 10.9 oz)] 87.4 kg (192 lb 10.9 oz) (08/29 0400) Last BM Date: 02/23/13  Intake/Output from previous day: 08/28 0701 - 08/29 0700 In: 2240.3 [I.V.:1770.3; IV Piggyback:470] Out: 3601 [Urine:3600; Stool:1] Intake/Output this shift:    PE: General--crying holding her chest Heart-- Lungs--clear Abdomen--soft and nondistended  BSs present  Lab Results:  Recent Labs  02/23/13 1232 02/24/13 0410 02/25/13 0445  WBC 13.1* 10.9* 5.8  HGB 10.9* 9.7* 9.2*  HCT 33.2* 30.0* 28.1*  PLT 346 302 258   BMET  Recent Labs  02/23/13 1232 02/23/13 2000 02/24/13 0410 02/25/13 0445  NA 126* 131* 132* 139  K 4.2 3.8 3.4* 4.4  CL 85* 101 105 115*  CO2 12* 13* 14* 18*  CREATININE 7.23* 3.93* 2.28* 0.83   LFT  Recent Labs  02/23/13 1232 02/24/13 0410  PROT 6.7 5.4*  AST 46* 34  ALT 25 21  ALKPHOS 116 77  BILITOT 0.1* 0.1*  BILIDIR  --  <0.1  IBILI  --  NOT CALCULATED   PT/INR  Recent Labs  02/25/13 0445  LABPROT 12.9  INR 0.99   PANCREAS  Recent Labs  02/23/13 1232  LIPASE 43         Studies/Results: Ct Abdomen Pelvis Wo Contrast  02/23/2013   *RADIOLOGY REPORT*  Clinical Data: Diarrhea and abdominal pain  CT ABDOMEN AND PELVIS WITHOUT CONTRAST  Technique:  Multidetector CT imaging of the abdomen and pelvis was performed following the standard protocol without intravenous contrast.  Comparison: 10/14/2012  Findings: The lung bases are free of acute  infiltrate or sizable effusion.  The liver and spleen are stable in appearance from prior exam.  The gallbladder is significantly dilated however no pericholecystic fluid or wall thickening is noted.  No definitive cholelithiasis is seen.  No extrahepatic biliary ductal dilatation is noted.  The adrenal glands and pancreas are within normal limits.  The kidneys are well visualized bilaterally.  No renal calculi or obstructive changes are noted.  A Foley catheter is decompressed the bladder.  The appendix is not well visualized may been surgically removed.  Postsurgical changes in the stomach are again seen.  IMPRESSION: Dilated gallbladder although no wall thickening or pericholecystic fluid is noted.  No cholelithiasis is seen.  No acute abnormality is noted.   Original Report Authenticated By: Alcide Clever, M.D.   Dg Abd 1 View  02/23/2013   *RADIOLOGY REPORT*  Clinical Data: Abdominal pain, weakness  ABDOMEN - 1 VIEW  Comparison: CT abdomen pelvis - 10/14/2012  Findings:  There is mild nonspecific gaseous distension of a loop of small bowel within the midhemiabdomen measuring approximately 4.3 cm in diameter.  This finding is associated with a paucity of distal colonic gas.  No supine evidence of pneumoperitoneum.  No definite pneumatosis or portal venous gas.  No definite abnormal intra-abdominal calcifications.  Multilevel thoracolumbar spine degenerative change suspected.  IMPRESSION: Mild gaseous distension of a loop of small bowel left mid hemiabdomen,  nonspecific but could be seen in the setting of early small bowel obstruction.  Clinical correlation is advised.   Original Report Authenticated By: Tacey Ruiz, MD   US Abdomen Port  02/24/2013   *RADIOLOGY REPORT*  Clinical Data:  Markedly distended gallbladder on CT scan of 02/23/2013. Bilirubin is not elevated.  COMPLETE ABDOMINAL ULTRASOUND  Comparison:  CT scan dated 02/23/2013 and ultrasound dated 10/16/2012  Findings:  Gallbladder:  No  gallstones, gallbladder wall thickening, or pericholecystic fluid. The gallbladder is distended.  Negative sonographic Murphy's sign.  Common bile duct:  Common bile duct is 11 mm in diameter, unchanged since 10/16/2012.  No dilated intrahepatic bile ducts.  Liver:  No focal lesion identified.  Within normal limits in parenchymal echogenicity.  IVC:  Normal.  Pancreas:  Head and body of the pancreas are normal.  Tail is obscured by bowel.  Spleen:  Normal.  Formal 5 cm in length.  Right Kidney:  Normal.  10.8 cm in length.  Left Kidney:  Normal.  10.0 cm in length.  Abdominal aorta:  Normal.  2.1 cm in diameter.  IMPRESSION: The gallbladder is distended but there are no stones and the wall is not thickened. The gallbladder was distended on the prior ultrasound of 10/16/2012 but not to quite the same degree. There is chronic dilatation of the common bile duct but there are no dilated intrahepatic bile ducts.  The patient's bilirubin is not elevated.   Original Report Authenticated By: Francene Boyers, M.D.   Dg Chest Portable 1 View  02/23/2013   *RADIOLOGY REPORT*  Clinical Data: Neck pain.  Diarrhea.  Abdominal pain.  Rectal bleeding.  PORTABLE CHEST - 1 VIEW  Comparison: Chest x-ray 03/29/2009.  Findings: There is a left-sided internal jugular central venous catheter with tip terminating in the proximal superior vena cava. Lung volumes are slightly low.  No acute consolidative air space disease.  No pleural effusions.  No evidence of pulmonary edema. Heart size is within normal limits.  Upper mediastinal contours are within normal limits for the portable supine technique.  No pneumothorax.  IMPRESSION: 1.  No radiographic evidence of acute cardiopulmonary disease. 2.  Left internal jugular central venous catheter with tip in the proximal superior vena cava.   Original Report Authenticated By: Trudie Reed, M.D.    Medications: I have reviewed the patient's current medications.  Assessment/Plan: 1.  Diarrhea. Unprepped sigmoid cancelled for now due to chest pain. We can do later today or tomorrow if stable. 2. Chest Pain. Doubt cardiac but could be. Will follow and resume diet for now.   Ashanta Amoroso JR,Tannis Burstein L 02/25/2013, 7:52 AM

## 2013-02-25 NOTE — Procedures (Signed)
Intubation Procedure Note MIAA [name] [id number] 21-Jan-1946  Procedure: Intubation Indications: Respiratory insufficiency  Procedure Details Consent: Unable to obtain consent because of emergent medical necessity. Time Out: Verified patient identification, verified procedure, site/side was marked, verified correct patient position, special equipment/implants available, medications/allergies/relevent history reviewed, required imaging and test results available.  Performed  Maximum sterile technique was used including antiseptics, cap, gloves, hand hygiene and mask.  GlideScope #3    Evaluation Hemodynamic Status: BP stable throughout; O2 sats: stable throughout Patient's Current Condition: stable Complications: No apparent complications Patient did tolerate procedure well. Chest X-ray ordered to verify placement.  CXR: pending.   [name] 02/25/2013

## 2013-02-25 NOTE — Progress Notes (Signed)
PULMONARY  / CRITICAL CARE MEDICINE  Name: [name] MRN: [id number] DOB: 04-30-1946    ADMISSION DATE:  02/23/2013  REFERRING MD :  EDP PRIMARY SERVICE: PCCM  REASON FOR ADMISSION: persistent hypotension after volume resuscitation  BRIEF PATIENT DESCRIPTION: 36 F with hx of IBS admitted via [facility] ED with 2-3 wks of N/V, anorexia, severe diarrhea and 2-3 days of weakness, HA. Remained hypotensive despite 4 liters NS in ED.  Admitted with suspected gastroenteritis, hypovolemic shock, r/o septic shock. ARI, hyponatremia  60F with PMH of IBS, chronic diarrhea, duodenal stricture repair presented to ED as noted above. She remained hypotensive despite 4L NS. Also c/o blood on tissue after wiping. Describes stools as watery but not frankly bloody. Has recently been started on an anti-hypertensive medication. Unsure name of it. Describes profund weakness on day of admission. Also c/o worsening HA over past 2-3 days. Denies fever, chills, sweats, CP, cough, SOB, sputum, hemoptysis, photophobia, nuchal rigidity. Has chronic intermittent LE edema - not worse than baseline.      LINES / TUBES: L IJ CVL 8/26 >>   CULTURES: C diff 8/26 >>  NEG Stool 8/26 >>  Urine 8/26 >>  Blood 8/26 >>       ANTIBIOTICS: Ciprofloxacin 8/26 >>  (7 days) Metronidazole (PO) 8/26 >>  (7 days)     SIGNIFICANT EVENTS / STUDIES:  8./28 - c diff negative. Still on pressors. Hungry and wants to eat. C/o left arm pain and many somatic complaints. CVP 14.     SUBJECTIVE/OVERNIGHT/INTERVAL HX  8/29 - sigmodoiscope cancellend due to chest pain. Now resolved. EKG normal. Crying. Wants to go home -against medical advice. Says does not like bed. Adamant about going home. No diarrhea x 1 day  Patieint home pills NOS found at bedside at 1am today: identified as ambien, xanax. Since then wanting to go AMA and also off pressors since 4am today  VITAL SIGNS: Temp:  [97.5 F (36.4 C)-99 F (37.2 C)] 98.2 F (36.8  C) (08/29 0700) Pulse Rate:  [89-109] 104 (08/29 0700) Resp:  [10-22] 22 (08/29 0700) BP: (89-137)/(38-79) 127/65 mmHg (08/29 0700) SpO2:  [91 %-97 %] 96 % (08/29 0700) Weight:  [87.4 kg (192 lb 10.9 oz)] 87.4 kg (192 lb 10.9 oz) (08/29 0400) HEMODYNAMICS: CVP:  [16 mmHg-17 mmHg] 16 mmHg VENTILATOR SETTINGS:   INTAKE / OUTPUT: Intake/Output     08/28 0701 - 08/29 0700 08/29 0701 - 08/30 0700   P.O.     I.V. (mL/kg) 2190.3 (25.1)    IV Piggyback 470    Total Intake(mL/kg) 2660.3 (30.4)    Urine (mL/kg/hr) 3600 (1.7)    Stool 1 (0)    Total Output 3601     Net -940.7            PHYSICAL EXAMINATION: General:  Chronicially ill looking Pyshch: Flat affect, anxious. INsisisting on signning out against medical advice Neuro:  CNs intact, motor/sens grossly intact, DTRs symmetric HEENT: NCAT, EOMI, PERRL Cardiovascular: RRR s M Lungs: CTAP Abdomensoft, non tender, no organ megally, normal bowel sounds. No diarrhea Ext: cool, diminished DP pulses, no edema Skin: no rashes, lesions MSK: No restriction to passive range of LUE and pulses well felt  LABS: PULMONARY No results found for this basename: PHART, PCO2, PCO2ART, PO2, PO2ART, HCO3, TCO2, O2SAT,  in the last 168 hours  CBC  Recent Labs Lab 02/23/13 1232 02/24/13 0410 02/25/13 0445  HGB 10.9* 9.7* 9.2*  HCT 33.2* 30.0* 28.1*  WBC  13.1* 10.9* 5.8  PLT 346 302 258    COAGULATION  Recent Labs Lab 02/25/13 0445  INR 0.99    CARDIAC    Recent Labs Lab 02/23/13 1232 02/25/13 0445  TROPONINI <0.30 <0.30   No results found for this basename: PROBNP,  in the last 168 hours   CHEMISTRY  Recent Labs Lab 02/23/13 1232 02/23/13 2000 02/24/13 0410 02/24/13 1130 02/25/13 0445  NA 126* 131* 132*  --  139  K 4.2 3.8 3.4*  --  4.4  CL 85* 101 105  --  115*  CO2 12* 13* 14*  --  18*  GLUCOSE 109* 82 194*  --  144*  BUN 105* 82* 64*  --  22  CREATININE 7.23* 3.93* 2.28*  --  0.83  CALCIUM 8.0* 6.6* 6.6*   --  8.7  MG  --   --   --  1.4* 2.1  PHOS  --   --   --  2.7 1.8*   Estimated Creatinine Clearance: 69.1 ml/min (by C-G formula based on Cr of 0.83).   LIVER  Recent Labs Lab 02/23/13 1232 02/24/13 0410 02/25/13 0445  AST 46* 34  --   ALT 25 21  --   ALKPHOS 116 77  --   BILITOT 0.1* 0.1*  --   PROT 6.7 5.4*  --   ALBUMIN 3.0* 2.2*  --   INR  --   --  0.99     INFECTIOUS  Recent Labs Lab 02/23/13 1200 02/23/13 1358 02/23/13 2000 02/24/13 0410 02/25/13 0445  LATICACIDVEN  --  2.14 0.5  --  0.7  PROCALCITON 6.21  --   --  1.06 0.24     ENDOCRINE CBG (last 3)  No results found for this basename: GLUCAP,  in the last 72 hours       IMAGING x48h  Ct Abdomen Pelvis Wo Contrast  02/23/2013   *RADIOLOGY REPORT*  Clinical Data: Diarrhea and abdominal pain  CT ABDOMEN AND PELVIS WITHOUT CONTRAST  Technique:  Multidetector CT imaging of the abdomen and pelvis was performed following the standard protocol without intravenous contrast.  Comparison: 10/14/2012  Findings: The lung bases are free of acute infiltrate or sizable effusion.  The liver and spleen are stable in appearance from prior exam.  The gallbladder is significantly dilated however no pericholecystic fluid or wall thickening is noted.  No definitive cholelithiasis is seen.  No extrahepatic biliary ductal dilatation is noted.  The adrenal glands and pancreas are within normal limits.  The kidneys are well visualized bilaterally.  No renal calculi or obstructive changes are noted.  A Foley catheter is decompressed the bladder.  The appendix is not well visualized may been surgically removed.  Postsurgical changes in the stomach are again seen.  IMPRESSION: Dilated gallbladder although no wall thickening or pericholecystic fluid is noted.  No cholelithiasis is seen.  No acute abnormality is noted.   Original Report Authenticated By: Alcide Clever, M.D.   Dg Abd 1 View  02/23/2013   *RADIOLOGY REPORT*  Clinical Data:  Abdominal pain, weakness  ABDOMEN - 1 VIEW  Comparison: CT abdomen pelvis - 10/14/2012  Findings:  There is mild nonspecific gaseous distension of a loop of small bowel within the midhemiabdomen measuring approximately 4.3 cm in diameter.  This finding is associated with a paucity of distal colonic gas.  No supine evidence of pneumoperitoneum.  No definite pneumatosis or portal venous gas.  No definite abnormal intra-abdominal calcifications.  Multilevel thoracolumbar  spine degenerative change suspected.  IMPRESSION: Mild gaseous distension of a loop of small bowel left mid hemiabdomen, nonspecific but could be seen in the setting of early small bowel obstruction.  Clinical correlation is advised.   Original Report Authenticated By: Tacey Ruiz, MD   US Abdomen Port  02/24/2013   *RADIOLOGY REPORT*  Clinical Data:  Markedly distended gallbladder on CT scan of 02/23/2013. Bilirubin is not elevated.  COMPLETE ABDOMINAL ULTRASOUND  Comparison:  CT scan dated 02/23/2013 and ultrasound dated 10/16/2012  Findings:  Gallbladder:  No gallstones, gallbladder wall thickening, or pericholecystic fluid. The gallbladder is distended.  Negative sonographic Murphy's sign.  Common bile duct:  Common bile duct is 11 mm in diameter, unchanged since 10/16/2012.  No dilated intrahepatic bile ducts.  Liver:  No focal lesion identified.  Within normal limits in parenchymal echogenicity.  IVC:  Normal.  Pancreas:  Head and body of the pancreas are normal.  Tail is obscured by bowel.  Spleen:  Normal.  Formal 5 cm in length.  Right Kidney:  Normal.  10.8 cm in length.  Left Kidney:  Normal.  10.0 cm in length.  Abdominal aorta:  Normal.  2.1 cm in diameter.  IMPRESSION: The gallbladder is distended but there are no stones and the wall is not thickened. The gallbladder was distended on the prior ultrasound of 10/16/2012 but not to quite the same degree. There is chronic dilatation of the common bile duct but there are no dilated  intrahepatic bile ducts.  The patient's bilirubin is not elevated.   Original Report Authenticated By: Francene Boyers, M.D.   Dg Chest Portable 1 View  02/23/2013   *RADIOLOGY REPORT*  Clinical Data: Neck pain.  Diarrhea.  Abdominal pain.  Rectal bleeding.  PORTABLE CHEST - 1 VIEW  Comparison: Chest x-ray 03/29/2009.  Findings: There is a left-sided internal jugular central venous catheter with tip terminating in the proximal superior vena cava. Lung volumes are slightly low.  No acute consolidative air space disease.  No pleural effusions.  No evidence of pulmonary edema. Heart size is within normal limits.  Upper mediastinal contours are within normal limits for the portable supine technique.  No pneumothorax.  IMPRESSION: 1.  No radiographic evidence of acute cardiopulmonary disease. 2.  Left internal jugular central venous catheter with tip in the proximal superior vena cava.   Original Report Authenticated By: Trudie Reed, M.D.       ASSESSMENT / PLAN:  PULMONARY A: No issues P:   Supplemental O2 as needed  CARDIOVASCULAR A: H/O hypertension Hypotension, presumed hypovolemic - r/o septic  8/29 - atypical chest pain resolved. EKG nil acute. Troponin preceding pain was normal. Off pressors x 4am after discovery of home CNS meds  P:  MAP goal > 65 Dc hydrocort  RENAL A:  Acute renal failure, nonoliguric - resolved Metabolic acidosis - non gap due to diarhhea - persists mild Low phos - being repleted    P:   Saline lock and monitor Need to determine what anti-hypertensive was recently started (?ACEI or ARB) Monitor BMET intermittently Correct electrolytes as indicated Check ionizec calcium; Rx with calcium gluconate Check mag and phos   GASTROINTESTINAL A:  H/O IBS Nausea, anorexia Profound diarrhea Heme+ stool (card +, not grossly) - suspect from external irritation   02/24/13 - diarrhea better. C diff neative 02/25/13 - diarrhea resolved. GI canceld sig scope due  to chest pain and  ? Due to poor prep  P:   Per gi  Dr Randa Evens Regular diet at her insistence; GI scope will not happened due to impending weekend  HEMATOLOGIC A:  No issues P:  Monitor CBC intermittently  INFECTIOUS A:  Suspected gastroenteritis Suspected severe sepsis, not sure   P:   PCT algorithm Po flagyl  X 7 days Change IV to po cipro x total 7 days   ENDOCRINE A:  Reported history of hypothyroidism. Not on levothyroxine. TSH 3.9 on 02/24/13 P:   Monitor off synthroid   NEUROLOGIC A: Chronic pain and anxiety  02/25/13 - discovered t surreptiously using several CNS drugs at bedside and since then wanting to go AMA.   P:   prn fenanyl due to renal failure Prn ambien for insomnia   GLOBAL 02/27/13 - d/w patient. She has agreed to say 1-2 days more and if stable go home 02/26/13 or 02/27/13 from floor. She is not sure about staying behind for GI scope but will follow as opd. She wants to eat regular diet. Move to med surg    Dr. Kalman Shan, M.D., [facility].C.P Pulmonary and Critical Care Medicine Staff Physician [facility] [place] [facility] Pager: [phone], If no answer or between  15:00h - 7:00h: call [phone]  02/25/2013 10:33 AM

## 2013-02-26 ENCOUNTER — Encounter (HOSPITAL_COMMUNITY): Payer: Self-pay | Admitting: Radiology

## 2013-02-26 ENCOUNTER — Inpatient Hospital Stay (HOSPITAL_COMMUNITY): Payer: Medicare Other

## 2013-02-26 ENCOUNTER — Encounter (HOSPITAL_COMMUNITY): Payer: Self-pay | Admitting: Certified Registered Nurse Anesthetist

## 2013-02-26 DIAGNOSIS — R579 Shock, unspecified: Secondary | ICD-10-CM

## 2013-02-26 DIAGNOSIS — I214 Non-ST elevation (NSTEMI) myocardial infarction: Secondary | ICD-10-CM

## 2013-02-26 DIAGNOSIS — J9601 Acute respiratory failure with hypoxia: Secondary | ICD-10-CM

## 2013-02-26 LAB — BASIC METABOLIC PANEL
BUN: 17 mg/dL (ref 6–23)
Creatinine, Ser: 0.82 mg/dL (ref 0.50–1.10)
GFR calc Af Amer: 85 mL/min — ABNORMAL LOW (ref >90)
GFR calc non Af Amer: 73 mL/min — ABNORMAL LOW (ref >90)
Glucose, Bld: 165 mg/dL — ABNORMAL HIGH (ref 70–99)
Potassium: 4.6 mEq/L (ref 3.5–5.1)

## 2013-02-26 LAB — CBC WITH DIFFERENTIAL/PLATELET
Basophils Absolute: 0 10*3/uL (ref 0.0–0.1)
Basophils Relative: 0 % (ref 0–1)
Eosinophils Absolute: 0 10*3/uL (ref 0.0–0.7)
HCT: 31 % — ABNORMAL LOW (ref 36.0–46.0)
Hemoglobin: 10.3 g/dL — ABNORMAL LOW (ref 12.0–15.0)
MCH: 28.4 pg (ref 26.0–34.0)
MCHC: 33.2 g/dL (ref 30.0–36.0)
Monocytes Absolute: 0.4 10*3/uL (ref 0.1–1.0)
Monocytes Relative: 3 % (ref 3–12)
Neutrophils Relative %: 89 % — ABNORMAL HIGH (ref 43–77)
RDW: 16.5 % — ABNORMAL HIGH (ref 11.5–15.5)

## 2013-02-26 LAB — BLOOD GAS, ARTERIAL
Acid-base deficit: 8.2 mmol/L — ABNORMAL HIGH (ref 0.0–2.0)
Bicarbonate: 15.3 mEq/L — ABNORMAL LOW (ref 20.0–24.0)
Bicarbonate: 14.7 mEq/L — ABNORMAL LOW (ref 20.0–24.0)
Drawn by: 331471
FIO2: 0.6 %
FIO2: 0.4 %
O2 Saturation: 97.8 %
PEEP: 5 cmH2O
PEEP: 0.5 cmH2O
Patient temperature: 98.6
RATE: 22 resp/min
TCO2: 13.5 mmol/L (ref 0–100)
pCO2 arterial: 38.5 mmHg (ref 35.0–45.0)
pCO2 arterial: 24.5 mmHg — ABNORMAL LOW (ref 35.0–45.0)
pH, Arterial: 7.223 — ABNORMAL LOW (ref 7.350–7.450)
pH, Arterial: 7.41 (ref 7.350–7.450)
pH, Arterial: 7.4 (ref 7.350–7.450)
pO2, Arterial: 437 mmHg — ABNORMAL HIGH (ref 80.0–100.0)
pO2, Arterial: 166 mmHg — ABNORMAL HIGH (ref 80.0–100.0)

## 2013-02-26 LAB — LACTIC ACID, PLASMA: Lactic Acid, Venous: <0.2 mmol/L — ABNORMAL LOW (ref 0.5–2.2)

## 2013-02-26 LAB — CARBOXYHEMOGLOBIN
O2 Saturation: 62.1 %
Total hemoglobin: 10.5 g/dL — ABNORMAL LOW (ref 12.0–16.0)

## 2013-02-26 LAB — HEPARIN LEVEL (UNFRACTIONATED)
Heparin Unfractionated: 0.58 IU/mL (ref 0.30–0.70)
Heparin Unfractionated: 0.59 IU/mL (ref 0.30–0.70)

## 2013-02-26 MED ORDER — ADULT MULTIVITAMIN LIQUID CH
5.0000 mL | Freq: Every day | ORAL | Status: DC
  Administered 2013-02-26 – 2013-03-02 (×5): 5 mL
  Filled 2013-02-26 (×6): qty 5

## 2013-02-26 MED ORDER — HEPARIN (PORCINE) IN NACL 100-0.45 UNIT/ML-% IJ SOLN
1050.0000 [IU]/h | INTRAMUSCULAR | Status: DC
  Administered 2013-02-26: 900 [IU]/h via INTRAVENOUS
  Administered 2013-02-28: 850 [IU]/h via INTRAVENOUS
  Administered 2013-02-28 – 2013-03-02 (×3): 1050 [IU]/h via INTRAVENOUS
  Filled 2013-02-26 (×9): qty 250

## 2013-02-26 MED ORDER — METHYLPREDNISOLONE SODIUM SUCC 40 MG IJ SOLR
40.0000 mg | Freq: Four times a day (QID) | INTRAMUSCULAR | Status: DC
  Administered 2013-02-26: 40 mg via INTRAVENOUS
  Filled 2013-02-26 (×4): qty 1

## 2013-02-26 MED ORDER — METHYLPREDNISOLONE SODIUM SUCC 40 MG IJ SOLR
40.0000 mg | Freq: Once | INTRAMUSCULAR | Status: AC
  Administered 2013-02-25: 40 mg via INTRAVENOUS

## 2013-02-26 MED ORDER — ASPIRIN 81 MG PO CHEW
81.0000 mg | CHEWABLE_TABLET | Freq: Every day | ORAL | Status: DC
  Filled 2013-02-26: qty 1

## 2013-02-26 MED ORDER — VITAL AF 1.2 CAL PO LIQD
1000.0000 mL | ORAL | Status: DC
  Administered 2013-02-26 – 2013-02-27 (×2): 1000 mL
  Filled 2013-02-26 (×3): qty 1000

## 2013-02-26 MED ORDER — IOHEXOL 350 MG/ML SOLN
100.0000 mL | Freq: Once | INTRAVENOUS | Status: AC | PRN
  Administered 2013-02-26: 100 mL via INTRAVENOUS

## 2013-02-26 MED ORDER — ASPIRIN 81 MG PO CHEW
81.0000 mg | CHEWABLE_TABLET | Freq: Every day | ORAL | Status: DC
  Administered 2013-02-26 – 2013-03-02 (×5): 81 mg
  Filled 2013-02-26 (×6): qty 1

## 2013-02-26 MED ORDER — PERFLUTREN LIPID MICROSPHERE
1.0000 mL | INTRAVENOUS | Status: AC | PRN
  Administered 2013-02-26: 3 mL via INTRAVENOUS
  Filled 2013-02-26: qty 10

## 2013-02-26 MED ORDER — PHENYLEPHRINE HCL 10 MG/ML IJ SOLN
30.0000 ug/min | INTRAVENOUS | Status: DC
  Administered 2013-02-26: 28 ug/min via INTRAVENOUS
  Administered 2013-02-26: 30 ug/min via INTRAVENOUS
  Filled 2013-02-26 (×2): qty 1

## 2013-02-26 MED ORDER — HYDROCORTISONE SOD SUCCINATE 100 MG IJ SOLR
50.0000 mg | Freq: Four times a day (QID) | INTRAMUSCULAR | Status: DC
  Administered 2013-02-26 – 2013-02-27 (×5): 50 mg via INTRAVENOUS
  Filled 2013-02-26 (×8): qty 1

## 2013-02-26 MED ORDER — ALBUMIN HUMAN 25 % IV SOLN
12.5000 g | Freq: Once | INTRAVENOUS | Status: DC
  Filled 2013-02-26: qty 50

## 2013-02-26 MED ORDER — BIOTENE DRY MOUTH MT LIQD
15.0000 mL | Freq: Four times a day (QID) | OROMUCOSAL | Status: DC
  Administered 2013-02-26 – 2013-03-08 (×36): 15 mL via OROMUCOSAL

## 2013-02-26 MED ORDER — SODIUM CHLORIDE 0.9 % IV BOLUS (SEPSIS)
1000.0000 mL | Freq: Once | INTRAVENOUS | Status: AC
  Administered 2013-02-26: 1000 mL via INTRAVENOUS

## 2013-02-26 MED ORDER — HEPARIN BOLUS VIA INFUSION
4000.0000 [IU] | Freq: Once | INTRAVENOUS | Status: AC
  Administered 2013-02-26: 4000 [IU] via INTRAVENOUS
  Filled 2013-02-26: qty 4000

## 2013-02-26 MED ORDER — CHLORHEXIDINE GLUCONATE 0.12 % MT SOLN
15.0000 mL | Freq: Two times a day (BID) | OROMUCOSAL | Status: DC
  Administered 2013-02-26 – 2013-03-08 (×21): 15 mL via OROMUCOSAL
  Filled 2013-02-26 (×23): qty 15

## 2013-02-26 NOTE — Progress Notes (Signed)
Patient taken to CT and placed flat on the CT table.  O2 sats dropped between 60-80%.  RT used Ambu bag to ensure oxygenation.  Dr. Marchelle Gearing notified as patient transferred back to ICU via bed.  After sitting patient HOB back to > 30 for transfer,  O2 sats quickly increased to greater than 90%.

## 2013-02-26 NOTE — Progress Notes (Addendum)
PULMONARY  / CRITICAL CARE MEDICINE  Name: [name] MRN: [id number] DOB: May 21, 1946    ADMISSION DATE:  02/23/2013  REFERRING MD :  EDP PRIMARY SERVICE: PCCM  REASON FOR ADMISSION: persistent hypotension after volume resuscitation  BRIEF PATIENT DESCRIPTION: 83 F with hx of IBS admitted via [facility] ED with 2-3 wks of N/V, anorexia, severe diarrhea and 2-3 days of weakness, HA. Remained hypotensive despite 4 liters NS in ED.  Admitted with suspected gastroenteritis, hypovolemic shock, r/o septic shock. ARI, hyponatremia  51F with PMH of IBS, chronic diarrhea, duodenal stricture repair presented to ED as noted above. She remained hypotensive despite 4L NS. Also c/o blood on tissue after wiping. Describes stools as watery but not frankly bloody. Has recently been started on an anti-hypertensive medication. Unsure name of it. Describes profund weakness on day of admission. Also c/o worsening HA over past 2-3 days. Denies fever, chills, sweats, CP, cough, SOB, sputum, hemoptysis, photophobia, nuchal rigidity. Has chronic intermittent LE edema - not worse than baseline.      LINES / TUBES: L IJ CVL 8/26 >>  ETT 02/25/13 Emergent ? Stridor but in setting of NSTEMI >>  CULTURES: C diff 8/26 >>  NEG Stool 8/26 >>  Urine 8/26 >>  Blood 8/26 >>   Recent Results (from the past 240 hour(s))  CULTURE, BLOOD (ROUTINE X 2)     Status: None   Collection Time    02/23/13  2:40 PM      Result Value Range Status   Specimen Description BLOOD LEFT ANTECUBITAL   Final   Special Requests BOTTLES DRAWN AEROBIC AND ANAEROBIC 5CC   Final   Culture  Setup Time     Final   Value: 02/23/2013 22:45     Performed at [facility]   Culture     Final   Value:        BLOOD CULTURE RECEIVED NO GROWTH TO DATE CULTURE WILL BE HELD FOR 5 DAYS BEFORE ISSUING A FINAL NEGATIVE REPORT     Performed at [facility]   Report Status PENDING   Incomplete  CULTURE, BLOOD (ROUTINE X 2)     Status: None   Collection Time    02/23/13  3:30 PM      Result Value Range Status   Specimen Description BLOOD LEFT NECK   Final   Special Requests BOTTLES DRAWN AEROBIC AND ANAEROBIC 10CC   Final   Culture  Setup Time     Final   Value: 02/23/2013 22:46     Performed at [facility]   Culture     Final   Value:        BLOOD CULTURE RECEIVED NO GROWTH TO DATE CULTURE WILL BE HELD FOR 5 DAYS BEFORE ISSUING A FINAL NEGATIVE REPORT     Performed at [facility]   Report Status PENDING   Incomplete  URINE CULTURE     Status: None   Collection Time    02/23/13  3:40 PM      Result Value Range Status   Specimen Description URINE, CATHETERIZED   Final   Special Requests NONE   Final   Culture  Setup Time     Final   Value: 02/23/2013 22:51     Performed at [facility] Count     Final   Value: NO GROWTH     Performed at [facility]   Culture     Final  Value: NO GROWTH     Performed at [facility]   Report Status 02/24/2013 FINAL   Final  MRSA PCR SCREENING     Status: None   Collection Time    02/23/13  9:17 PM      Result Value Range Status   MRSA by PCR NEGATIVE  NEGATIVE Final   Comment:            The GeneXpert MRSA Assay (FDA     approved for NASAL specimens     only), is one component of a     comprehensive MRSA colonization     surveillance program. It is not     intended to diagnose MRSA     infection nor to guide or     monitor treatment for     MRSA infections.  STOOL CULTURE     Status: None   Collection Time    02/24/13  5:58 AM      Result Value Range Status   Specimen Description STOOL   Final   Special Requests Normal   Final   Culture     Final   Value: Culture reincubated for better growth     Performed at [facility]   Report Status PENDING   Incomplete  CLOSTRIDIUM DIFFICILE BY PCR     Status: None   Collection Time    02/24/13  5:58 AM      Result Value Range Status   C difficile by pcr NEGATIVE   NEGATIVE Final   Comment: Performed at [facility]       ANTIBIOTICS: Ciprofloxacin 8/26 >>  (7 days) Metronidazole (PO) 8/26 >>  (7 days) Anti-infectives   Start     Dose/Rate Route Frequency Ordered Stop   02/25/13 1600  vancomycin (VANCOCIN) IVPB 1000 mg/200 mL premix  Status:  Discontinued     1,000 mg 200 mL/hr over 60 Minutes Intravenous Every 48 hours 02/23/13 1419 02/23/13 1558   02/25/13 1200  ciprofloxacin (CIPRO) tablet 500 mg     500 mg Oral 2 times daily 02/25/13 1052 03/01/13 0759   02/24/13 1600  ciprofloxacin (CIPRO) IVPB 400 mg  Status:  Discontinued     400 mg 200 mL/hr over 60 Minutes Intravenous Every 24 hours 02/23/13 1839 02/25/13 1052   02/23/13 1630  ciprofloxacin (CIPRO) IVPB 400 mg  Status:  Discontinued     400 mg 200 mL/hr over 60 Minutes Intravenous Every 12 hours 02/23/13 1623 02/23/13 1838   02/23/13 1630  metroNIDAZOLE (FLAGYL) tablet 500 mg     500 mg Oral 3 times per day 02/23/13 1623 03/02/13 2159   02/23/13 1500  piperacillin-tazobactam (ZOSYN) IVPB 3.375 g  Status:  Discontinued     3.375 g 100 mL/hr over 30 Minutes Intravenous  Once 02/23/13 1402 02/23/13 1412   02/23/13 1500  vancomycin (VANCOCIN) 1,750 mg in sodium chloride 0.9 % 500 mL IVPB  Status:  Discontinued     1,750 mg 250 mL/hr over 120 Minutes Intravenous  Once 02/23/13 1418 02/23/13 1558   02/23/13 1430  piperacillin-tazobactam (ZOSYN) IVPB 2.25 g  Status:  Discontinued     2.25 g 100 mL/hr over 30 Minutes Intravenous 3 times per day 02/23/13 1414 02/23/13 1558          SIGNIFICANT EVENTS / STUDIES:  8./28 - c diff negative. Still on pressors. Hungry and wants to eat. C/o left arm pain and many somatic complaints. CVP 14.   8/29 -  sigmodoiscope cancellend due to chest pain. Now resolved. EKG normal. Crying. Wants to go home -against medical advice. Says does not like bed. Adamant about going home. No diarrhea x 1 day  Patieint home pills NOS found at bedside at  1am today: identified as ambien, xanax. Since then wanting to go AMA and also off pressors since 4am today   SUBJECTIVE/OVERNIGHT/INTERVAL HX  02/26/13 - Was adamant she would go out AMA yesterday and I had convinced her to stay in hospital after striking a deal that we would monitor her in the floor. Ihad warned her that patients who sign out AMA from ICU haave a very high risk of dying but she did nto seem to care. Overnight, appears troponins positive, chest pain continued, resp disterss (Stridor per bedside PCCM) and intubated.  Noted to have metabolic acidosis with poor resp comp. Currently sedated and on pressors. Unclear if she had access to her home CNS meds in the floor   VITAL SIGNS: Temp:  [97.5 F (36.4 C)-98.5 F (36.9 C)] 98 F (36.7 C) (08/30 0800) Pulse Rate:  [86-167] 92 (08/30 0800) Resp:  [15-35] 22 (08/30 0800) BP: (54-172)/(21-154) 92/66 mmHg (08/30 0800) SpO2:  [88 %-100 %] 100 % (08/30 0800) FiO2 (%):  [40 %-100 %] 40 % (08/30 0828) Weight:  [89.4 kg (197 lb 1.5 oz)] 89.4 kg (197 lb 1.5 oz) (08/30 0500) HEMODYNAMICS: CVP:  [18 mmHg-47 mmHg] 18 mmHg VENTILATOR SETTINGS: Vent Mode:  [-] PRVC FiO2 (%):  [40 %-100 %] 40 % Set Rate:  [15 bmp-22 bmp] 22 bmp Vt Set:  [500 mL] 500 mL PEEP:  [5 cmH20] 5 cmH20 Plateau Pressure:  [21 cmH20-33 cmH20] 22 cmH20 INTAKE / OUTPUT: Intake/Output     08/29 0701 - 08/30 0700 08/30 0701 - 08/31 0700   P.O. 640    I.V. (mL/kg) 872.5 (9.8)    IV Piggyback     Total Intake(mL/kg) 1512.5 (16.9)    Urine (mL/kg/hr) 1585 (0.7) 40 (0.2)   Stool     Total Output 1585 40   Net -72.5 -40        Urine Occurrence 2 x    Stool Occurrence       PHYSICAL EXAMINATION: General:  Critically ill looking Pyshch: Sedated Neuro:  RASS -2 on diprivan and fent gtt HEENT: NCAT, EOMI, PERRL Cardiovascular: RRR s M Lungs: CTAP Abdomensoft, non tender, no organ megally, normal bowel sounds. No diarrhea Ext: cool, diminished DP pulses, no  edema Skin: no rashes, lesions MSK: No restriction to passive range of LUE and pulses well felt  LABS: PULMONARY  Recent Labs Lab 02/26/13 0100 02/26/13 0414  PHART 7.223* 7.410  PCO2ART 38.5 23.6*  PO2ART 437.0* 242.0*  HCO3 15.3* 14.7*  TCO2 14.5 13.5  O2SAT 97.7 97.8    CBC  Recent Labs Lab 02/24/13 0410 02/25/13 0445 02/26/13 0505  HGB 9.7* 9.2* 10.3*  HCT 30.0* 28.1* 31.0*  WBC 10.9* 5.8 13.5*  PLT 302 258 278    COAGULATION  Recent Labs Lab 02/25/13 0445  INR 0.99    CARDIAC    Recent Labs Lab 02/25/13 0445 02/25/13 1050 02/25/13 1518 02/25/13 2140 02/26/13 0505  TROPONINI <0.30 <0.30 <0.30 1.39* 2.84*   No results found for this basename: PROBNP,  in the last 168 hours   CHEMISTRY  Recent Labs Lab 02/23/13 1232 02/23/13 2000 02/24/13 0410 02/24/13 1130 02/25/13 0445 02/26/13 0505  NA 126* 131* 132*  --  139 138  K 4.2 3.8 3.4*  --  4.4 4.6  CL 85* 101 105  --  115* 114*  CO2 12* 13* 14*  --  18* 14*  GLUCOSE 109* 82 194*  --  144* 165*  BUN 105* 82* 64*  --  22 17  CREATININE 7.23* 3.93* 2.28*  --  0.83 0.82  CALCIUM 8.0* 6.6* 6.6*  --  8.7 8.3*  MG  --   --   --  1.4* 2.1  --   PHOS  --   --   --  2.7 1.8*  --    Estimated Creatinine Clearance: 71 ml/min (by C-G formula based on Cr of 0.82).   LIVER  Recent Labs Lab 02/23/13 1232 02/24/13 0410 02/25/13 0445  AST 46* 34  --   ALT 25 21  --   ALKPHOS 116 77  --   BILITOT 0.1* 0.1*  --   PROT 6.7 5.4*  --   ALBUMIN 3.0* 2.2*  --   INR  --   --  0.99     INFECTIOUS  Recent Labs Lab 02/23/13 1200 02/23/13 1358 02/23/13 2000 02/24/13 0410 02/25/13 0445  LATICACIDVEN  --  2.14 0.5  --  0.7  PROCALCITON 6.21  --   --  1.06 0.24     ENDOCRINE CBG (last 3)  No results found for this basename: GLUCAP,  in the last 72 hours       IMAGING x48h  Dg Abd 1 View  02/26/2013   *RADIOLOGY REPORT*  Clinical Data: Status post nasogastric catheter placement   ABDOMEN - 1 VIEW  Comparison: 02/23/2013  Findings: A nasogastric catheter is now noted within the mid stomach.  Scattered large and small bowel gas is noted.  No definitive obstructive changes are seen.  The osseous structures are stable.  IMPRESSION: Nasogastric catheter within the stomach.   Original Report Authenticated By: Alcide Clever, M.D.   US Abdomen Port  02/24/2013   *RADIOLOGY REPORT*  Clinical Data:  Markedly distended gallbladder on CT scan of 02/23/2013. Bilirubin is not elevated.  COMPLETE ABDOMINAL ULTRASOUND  Comparison:  CT scan dated 02/23/2013 and ultrasound dated 10/16/2012  Findings:  Gallbladder:  No gallstones, gallbladder wall thickening, or pericholecystic fluid. The gallbladder is distended.  Negative sonographic Murphy's sign.  Common bile duct:  Common bile duct is 11 mm in diameter, unchanged since 10/16/2012.  No dilated intrahepatic bile ducts.  Liver:  No focal lesion identified.  Within normal limits in parenchymal echogenicity.  IVC:  Normal.  Pancreas:  Head and body of the pancreas are normal.  Tail is obscured by bowel.  Spleen:  Normal.  Formal 5 cm in length.  Right Kidney:  Normal.  10.8 cm in length.  Left Kidney:  Normal.  10.0 cm in length.  Abdominal aorta:  Normal.  2.1 cm in diameter.  IMPRESSION: The gallbladder is distended but there are no stones and the wall is not thickened. The gallbladder was distended on the prior ultrasound of 10/16/2012 but not to quite the same degree. There is chronic dilatation of the common bile duct but there are no dilated intrahepatic bile ducts.  The patient's bilirubin is not elevated.   Original Report Authenticated By: Francene Boyers, M.D.   Dg Chest Port 1 View  02/26/2013   CLINICAL DATA:  Intubation.  EXAM: PORTABLE CHEST - 1 VIEW  COMPARISON:  02/23/2013  FINDINGS: Left central line remains in stable position. Endotracheal tube has been placed and is 2 cm above the carinal. Bilateral perihilar airspace  opacities, slightly  more pronounced in the upper lobes. This could represent edema, possibly negative pressure edema. The heart is normal size. No effusions. No acute bony abnormality.  IMPRESSION: Endotracheal tube 2 cm above the carinal.  New perihilar and upper lobe opacities, question edema.   Electronically Signed   By: Charlett Nose   On: 02/26/2013 00:06       ASSESSMENT / PLAN:  PULMONARY A: VDRF 02/25/13 due to metabolic acidosis//poor resp compensation in setting of NSTEMI. Stridor reported but no abnormalities on intubation. ? Had acute pulm edema v negative pressure edema from VCD (likely latter)  P:   Full vent support Repeat abg CT chest rule out PE  CARDIOVASCULAR A: H/O hypertension Hypotension, presumed hypovolemic - r/o septic  8/23/104 - NSTEMI. Needing pressors. Note: hydrocort stopped yesterday. CVP 20 but Delta Pulse pressure is 33% suggesting volumde responsivness  P:  1L bolus and reassess hydrocort empiric restart Check scvo2 cvp goal > 10 MAP goal > 10 Continue IVH eparin Asa ECHO stat Dr Graciela Husbands cards consult called   RENAL A:  Acute renal failure, nonoliguric - resolved Metabolic acidosis - non gap due to diarhhea - persists mild Low phos - being repleted  02/26/13 - metabolic acidosis with poor resp comp at time of intubation.    P:   Maintain hemodynamics Mionitor lytes   GASTROINTESTINAL A:  H/O IBS Nausea, anorexia Profound diarrhea Heme+ stool (card +, not grossly) - suspect from external irritation   02/24/13 - diarrhea better. C diff neative 02/25/13 - diarrhea resolved. GI canceld sig scope due to chest pain and  ? Due to poor prep 02/26/13 - no diarrhea  P:   Per gi  Dr Orvil Feil consult for tube feeds  HEMATOLOGIC A:  No issues P:  - PRBC for hgb </= 6.9gm%    - exceptions are   -  if ACS susepcted/confirmed then transfuse for hgb </= 8.0gm%,  or    -  If septic shock first 24h and scvo2 < 70% then transfuse for hgb </= 9.0gm%   -  active bleeding with hemodynamic instability, then transfuse regardless of hemoglobin value   At at all times try to transfuse 1 unit prbc as possible with exception of active hemorrhage    INFECTIOUS A:  Suspected gastroenteritis Suspected severe sepsis, not sure   P:   PCT algorithm Po flagyl  X 7 days Change IV to po cipro x total 7 days   ENDOCRINE A:  Reported history of hypothyroidism. Not on levothyroxine. TSH 3.9 on 02/24/13  02/26/13 - back on presssors with intubation. But also correlates with coming off hydrocort P:   Monitor off synthroid Empiric hydrocort   NEUROLOGIC A: Chronic pain and anxiety  02/25/13 - discovered t surreptiously using several CNS drugs at bedside and since then wanting to go AMA.  02/26/13 - sedated with diprivan and fentanyl  P:   Diprivan and fentanyl gtt Monitor lactate and CK while on dirpivan  GLOBAL 02/25/13 - d/w patient. She has agreed to say 1-2 days more and if stable go home 02/26/13 or 02/27/13 from floor. She is not sure about staying behind for GI scope but will follow as opd. She wants to eat regular diet. Move to med surg  02/26/13 - get CT angio chest. Cards consult. Get social work and spiritual consult. Husband not at bedside     Dr. Kalman Shan, M.D., [facility].C.P Pulmonary and Critical Care Medicine Staff Physician [facility]  [place] [facility] Pager: [phone], If no answer or between  15:00h - 7:00h: call [phone]  02/26/2013 9:21 AM

## 2013-02-26 NOTE — Progress Notes (Signed)
ANTICOAGULATION CONSULT NOTE - Initial Consult  Pharmacy Consult for Heparin Indication: chest pain/ACS  No Known Allergies  Patient Measurements: Height: 5\' 2"  (157.5 cm) Weight: 197 lb 1.5 oz (89.4 kg) IBW/kg (Calculated) : 50.1 Heparin Dosing Weight: 70 kg  Vital Signs: Temp: 98.5 F (36.9 C) (08/30 0000) Temp src: Axillary (08/30 0000) BP: 148/127 mmHg (08/30 0430) Pulse Rate: 86 (08/30 0515)  Labs:  Recent Labs  02/24/13 0410 02/25/13 0445 02/25/13 1050 02/25/13 1518 02/25/13 2140 02/26/13 0505  HGB 9.7* 9.2*  --   --   --  10.3*  HCT 30.0* 28.1*  --   --   --  31.0*  PLT 302 258  --   --   --  278  LABPROT  --  12.9  --   --   --   --   INR  --  0.99  --   --   --   --   CREATININE 2.28* 0.83  --   --   --  0.82  CKTOTAL  --   --  401*  --   --   --   CKMB  --   --  9.6*  --   --   --   TROPONINI  --  <0.30 <0.30 <0.30 1.39* 2.84*    Estimated Creatinine Clearance: 71 ml/min (by C-G formula based on Cr of 0.82).   Medical History: Past Medical History  Diagnosis Date  . IBS (irritable bowel syndrome)   . Diverticulitis   . Hypertension   . Fibromyalgia     Medications:  Scheduled:  . albumin human  12.5 g Intravenous Once  . antiseptic oral rinse  15 mL Mouth Rinse BID  . antiseptic oral rinse  15 mL Mouth Rinse QID  . aspirin EC  81 mg Oral Daily  . chlorhexidine  15 mL Mouth Rinse BID  . ciprofloxacin  500 mg Oral BID  . LORazepam      . methylPREDNISolone (SOLU-MEDROL) injection  40 mg Intravenous Q6H  . metroNIDAZOLE  500 mg Oral Q8H  . multivitamin with minerals  1 tablet Oral Daily  . Racepinephrine HCl  0.5 mL Nebulization Once  . saccharomyces boulardii  250 mg Oral BID  . sodium phosphate  Dextrose 5% IVPB  30 mmol Intravenous Once   Infusions:  . fentaNYL infusion INTRAVENOUS 150 mcg/hr (02/26/13 0429)  . phenylephrine (NEO-SYNEPHRINE) Adult infusion 30 mcg/min (02/26/13 0500)  . propofol 20 mcg/kg/min (02/26/13 0601)     Assessment:  67 yr female admitted 8/27 with persistent hypotension, suspected gastroenteritis, acute renal failure and hypovolemic shock  Evening of 8/29 developed atypical chest pain and elevated troponin. Developed further respiratory distress and underwent intubation and transferred to ICU  Troponin continues to rise (most recent 2.84)  Plan to start IV heparin for R/O ACS/NSTEMI and plan for cardiology consult  Goal of Therapy:  Heparin level 0.3-0.7 units/ml Monitor platelets by anticoagulation protocol: Yes   Plan:   Heparin 4000 units IV bolus x 2 followed by heparin drip @ 900 units/hr  Check heparin level in 6 hrs  Check heparin level and CBC daily while on heparin  Ollis Daudelin, Joselyn Glassman, PharmD 02/26/2013,6:18 AM

## 2013-02-26 NOTE — Progress Notes (Signed)
2 D Echo with Definity administered by RN.  Echo tech noted Definity during scan.  Patient vital signs recorded in chart., no changes.

## 2013-02-26 NOTE — Progress Notes (Signed)
Called to see patient because of abnormal troponin in the context of hypotension and respiratory failure. The diagnosis of pulmonary embolism has been made in the interval. We will see again as requested

## 2013-02-26 NOTE — Progress Notes (Signed)
Ct angion with RLL PE  Plan - continue IV heparin   Dr. Kalman Shan, M.D., [facility].C.P Pulmonary and Critical Care Medicine Staff Physician [facility] [place] [facility] Pager: [phone], If no answer or between  15:00h - 7:00h: call [phone]  02/26/2013 12:27 PM

## 2013-02-26 NOTE — Progress Notes (Signed)
Rt transported pt to CT.

## 2013-02-26 NOTE — Progress Notes (Signed)
PHARMACY CONSULT FOR HEPARIN - FOLLOW UP  Repeat Heparin level = 0.59 on 900 units/hr which is within goal of 0.3-0.7 for ACS/PE. Heparin level therapeutic x 2 at current rate. No complications of therapy noted  Plan:  Continue heparin at current rate  F/U AM heparin level & CBC  Terrilee Files, PharmD  02/26/2013 10:54 PM

## 2013-02-26 NOTE — Progress Notes (Signed)
Arterial line attempted by Respiratory x 2 with no results . Anesthesia came and placed arterial line in left radial artery successfully . Patient tolerated well.

## 2013-02-26 NOTE — Progress Notes (Signed)
PHARMACY CONSULT FOR HEPARIN - FOLLOW UP  Heparin level = 0.58 on 900 units/hr which is within goal of 0.3-0.7 for ACS/PE.  Plan:  Continue heparin at current rate  Recheck level in 6hrs to verify therapeutic  Loralee Pacas, PharmD, BCPS 02/26/2013 3:40 PM

## 2013-02-26 NOTE — Progress Notes (Signed)
Patient very anxious seeming, unable to stay still for EKG. Rapid Response called, EKG obtained. Patient placed on 2L [place]. Patient began experiencing respiratory distress and elevated HR. Ativan 1mg  ordered and given. Sats began dropping into 80's. Critical care to bedside. Solumedrol ordered and given. Patient placed on non-rebreather. Transferred to ICU. Patient's husband called and updated. Report given to Merril Abbe, Charity fundraiser.

## 2013-02-26 NOTE — Plan of Care (Signed)
Problem: Phase I Progression Outcomes Goal: VTE prophylaxis Outcome: Completed/Met Date Met:  02/25/13 SCDs

## 2013-02-26 NOTE — Progress Notes (Signed)
Patient intubated with 7.5 ET tube. Patient was a rapid response from 3 east. Patient intubated and patient tolerating well with periods of anxiety. Settings are VT 500, Rate 15, Peep of 5, and FIO2 of 100%. ABG done and results were                       PH ........7.22                   PCO2.....Marland Kitchen38.5                    O2........437  Called CCM to give results of ABG . MD made changes for Rate from 15 to 22 and decrease FIO2. Patient settings are now VT 500, rate 22, peep of 5, and FIO2 of 60%. Will get another gas in a couple of hours per CCM MD. ABG ordered.

## 2013-02-26 NOTE — Progress Notes (Signed)
  Echocardiogram 2D Echocardiogram has been performed.  Estelle Grumbles 02/26/2013, 11:35 AM

## 2013-02-26 NOTE — Progress Notes (Signed)
eLink Physician-Brief Progress Note Patient Name: [name] DOB: [birth year] MRN: [id number]  Date of Service  02/26/2013   HPI/Events of Note  Patient s/p intubation for resp distress - PCXR suggestive of pulm edema but hypotensive with BP of 76/60 (65) .  Is making urine.     eICU Interventions  Plan: Insert aline for HD monitoring Albumin 25% bolus for BP support Consider NEO gtt for BP support given tachycardia   Intervention Category Intermediate Interventions: Hypotension - evaluation and management  Lawson Isabell 02/26/2013, 2:16 AM

## 2013-02-26 NOTE — Progress Notes (Signed)
eLink Physician-Brief Progress Note Patient Name: [name] DOB: 03-04-1946 MRN: [id number]  Date of Service  02/26/2013   HPI/Events of Note  Continued rise in trop now to 2.84.  No change in EKG but did have physiologic deterioration with ? Diastolic dysfunction requiring intubation.    eICU Interventions  Plan: Start heparin gtt Will need cardiology consult Hold BB due to hypotension      DETERDING,ELIZABETH 02/26/2013, 6:02 AM

## 2013-02-26 NOTE — Progress Notes (Addendum)
Called to room 1319 at 2220 per Lauren RN for pt complaining of chest pain and trouble breathing. Upon my arrival pt found anxious, diaphoretic complaining of chest pain and pain "all over" Ekg obtained yielding ST changes. Pt SOB, lung sounds with loud stridor, audible on auscultation and without stethascope. 02 sats low 90s on 2 LNC.  Pt able to state name, follows some commands, states "Im hurting everywhere, please help me" Kicks legs over side rail occasionally complains being hot. Dr Deterding called and updated on pt status. 1 mg ativan ordered for anxiety, PCCM MD en route to [facility] per Dr Deterding. Breathing treatment ordered and given. Ativan 1mg  given at 2246 IVP.  Pt stridor decreased after breathing treatment. At 2255 Neuro assessment changed, pt with blank stare, will not follow commands, non purposeful movements only, in upper extremities. Pt unable to state name or answer orientation questions. Dr Maryjo Rochester at bedside 2300 to assess pt. EPI inhalant breathing treatment ordered as well as solumedrol. 40 mg solumedrol given IVP, EPI ordered from pharmacy. Pt 02 sats dropped to low 80s placed on NRB with sats rebounding to upper 90 at 2315. Pt transferred at 2325 to ICU for intubation.

## 2013-02-26 NOTE — Progress Notes (Signed)
Brief Nutrition Note  Consult received for enteral/tube feeding initiation and management. Nutrition team is currently following this patient. Adult Enteral Nutrition Protocol initiated.   With brief calculation of estimated needs for permissive underfeeding: 1100 - 1250 kcal (22 - 25 kcal/kg IBW) and current propofol rate providing 277 kcal/day, will order Vital AF 1.2 at rate of 35 ml/hr to prevent overfeeding. Full follow-up note to follow.  Admitting Dx: Diarrhea [787.91] Dehydration [276.51] Acute renal failure [584.9] Bloody stool [578.1] Shock circulatory [785.50]  Body mass index is 36.04 kg/(m^2). Pt meets criteria for Obese Class II based on current BMI.  Labs:   Recent Labs Lab 02/24/13 0410 02/24/13 1130 02/25/13 0445 02/26/13 0505  NA 132*  --  139 138  K 3.4*  --  4.4 4.6  CL 105  --  115* 114*  CO2 14*  --  18* 14*  BUN 64*  --  22 17  CREATININE 2.28*  --  0.83 0.82  CALCIUM 6.6*  --  8.7 8.3*  MG  --  1.4* 2.1  --   PHOS  --  2.7 1.8*  --   GLUCOSE 194*  --  144* 165*    Jarold Motto MS, [place], LDN Pager: [phone] After-hours pager: [phone]

## 2013-02-27 ENCOUNTER — Inpatient Hospital Stay (HOSPITAL_COMMUNITY): Payer: Medicare Other

## 2013-02-27 DIAGNOSIS — I2699 Other pulmonary embolism without acute cor pulmonale: Secondary | ICD-10-CM

## 2013-02-27 DIAGNOSIS — I214 Non-ST elevation (NSTEMI) myocardial infarction: Secondary | ICD-10-CM

## 2013-02-27 DIAGNOSIS — R579 Shock, unspecified: Secondary | ICD-10-CM

## 2013-02-27 DIAGNOSIS — I10 Essential (primary) hypertension: Secondary | ICD-10-CM

## 2013-02-27 DIAGNOSIS — J9600 Acute respiratory failure, unspecified whether with hypoxia or hypercapnia: Secondary | ICD-10-CM

## 2013-02-27 LAB — LACTIC ACID, PLASMA: Lactic Acid, Venous: 0.9 mmol/L (ref 0.5–2.2)

## 2013-02-27 LAB — BASIC METABOLIC PANEL
CO2: 15 mEq/L — ABNORMAL LOW (ref 19–32)
Calcium: 8.2 mg/dL — ABNORMAL LOW (ref 8.4–10.5)
GFR calc Af Amer: 74 mL/min — ABNORMAL LOW (ref >90)
GFR calc non Af Amer: 64 mL/min — ABNORMAL LOW (ref >90)
Sodium: 136 mEq/L (ref 135–145)

## 2013-02-27 LAB — MAGNESIUM: Magnesium: 1.7 mg/dL (ref 1.5–2.5)

## 2013-02-27 LAB — CBC WITH DIFFERENTIAL/PLATELET
Basophils Absolute: 0 10*3/uL (ref 0.0–0.1)
Lymphocytes Relative: 17 % (ref 12–46)
Lymphs Abs: 1.9 10*3/uL (ref 0.7–4.0)
Neutrophils Relative %: 74 % (ref 43–77)
Platelets: 257 10*3/uL (ref 150–400)
RBC: 3.33 MIL/uL — ABNORMAL LOW (ref 3.87–5.11)
RDW: 16.7 % — ABNORMAL HIGH (ref 11.5–15.5)
WBC: 11.5 10*3/uL — ABNORMAL HIGH (ref 4.0–10.5)

## 2013-02-27 LAB — HEPARIN LEVEL (UNFRACTIONATED)
Heparin Unfractionated: 0.73 IU/mL — ABNORMAL HIGH (ref 0.30–0.70)
Heparin Unfractionated: 0.37 IU/mL (ref 0.30–0.70)

## 2013-02-27 LAB — GLUCOSE, CAPILLARY
Glucose-Capillary: 161 mg/dL — ABNORMAL HIGH (ref 70–99)
Glucose-Capillary: 180 mg/dL — ABNORMAL HIGH (ref 70–99)
Glucose-Capillary: 191 mg/dL — ABNORMAL HIGH (ref 70–99)

## 2013-02-27 LAB — PRO B NATRIURETIC PEPTIDE: Pro B Natriuretic peptide (BNP): 19190 pg/mL — ABNORMAL HIGH (ref 0–125)

## 2013-02-27 MED ORDER — POTASSIUM PHOSPHATE DIBASIC 3 MMOLE/ML IV SOLN
10.0000 mmol | Freq: Once | INTRAVENOUS | Status: AC
  Administered 2013-02-27: 10 mmol via INTRAVENOUS
  Filled 2013-02-27: qty 3.33

## 2013-02-27 MED ORDER — MAGNESIUM SULFATE 40 MG/ML IJ SOLN
2.0000 g | Freq: Once | INTRAMUSCULAR | Status: AC
  Administered 2013-02-27: 2 g via INTRAVENOUS
  Filled 2013-02-27: qty 50

## 2013-02-27 MED ORDER — HYDROCORTISONE SOD SUCCINATE 100 MG IJ SOLR
50.0000 mg | Freq: Three times a day (TID) | INTRAMUSCULAR | Status: DC
  Administered 2013-02-27 – 2013-02-28 (×3): 50 mg via INTRAVENOUS
  Filled 2013-02-27 (×3): qty 1

## 2013-02-27 MED ORDER — PRO-STAT SUGAR FREE PO LIQD
30.0000 mL | Freq: Two times a day (BID) | ORAL | Status: DC
  Administered 2013-02-27 – 2013-02-28 (×2): 30 mL via ORAL
  Filled 2013-02-27 (×3): qty 30

## 2013-02-27 MED ORDER — FUROSEMIDE 10 MG/ML IJ SOLN
40.0000 mg | Freq: Two times a day (BID) | INTRAMUSCULAR | Status: DC
  Administered 2013-02-27 – 2013-02-28 (×3): 40 mg via INTRAVENOUS
  Filled 2013-02-27 (×6): qty 4

## 2013-02-27 NOTE — Progress Notes (Signed)
Fentanyl drip decreased from 150 mcg an hour to 75 mcg per hour and propofol decreased from 50 mcg an hour to 25 mcg per hour.  Patient rass -3 at start of WUA and rass 2 at end of wake up assessment.  HR 140's, respirations > 30, Bp elevated and O2 sats down to 94%.  Fentanyl drip increased back to 150 mcg per hour and propofol drip increased to 40 mcg per hour.  After fentanyl bolus 100 mcg rass -1, respirations 22, bp 139/80, O2 sat 95% on FIO2 30%, and HR 110.  Patient did not follow commands or respond to questions.  Eyes were open and patient was restless.Weaning trial had to be terminated.  Notified Dr. Marchelle Gearing of WUA outcome.

## 2013-02-27 NOTE — Progress Notes (Signed)
NUTRITION FOLLOW UP and Consult Consult for TF management  Intervention:    Continue Vital 1.2 AF at 20 ml/hr per feeing tube  Add prostat 30 ml bid per feeding tube  Monitor closely secondary diprivan titration. Vital 1.2 AF at 20 ml/hr, current diprivan at 21 ml/hr and prostat 30 ml bid to provided:  1330 kcals, 66 gm protein. Continue to adjust TF as able based on diprivan titration with goal to prevent overfeeding while meeting protein needs as much as possible.  Nutrition Dx:   Inadequate oral intake related to nausea as evidenced by pat's report of not eating for the past 5 days- now n/a  New Dx:  Inadequate oral intake related to inability to eat AEB npo status.  Goal:   Enteral nutrition to provide 60-70% of estimated calorie needs (22-25 kcals/kg ideal body weight) and 100% of estimated protein needs, based on ASPEN guidelines for permissive underfeeding in critically ill obese individuals.   Monitor:   TF tolerance and adequacy, labs, weight  Assessment:   Full nutrition assessment 8/28.  Initiation of TF per TF protocol 8/30.  Patient receiving Vital 1.2 AF at 20 ml per hour which is providing:  576 kcals, 36 gm protein, and 389 ml free water daily. Admitted with diarrhea now resolved, Patient now intubated s/p respiratory arrest, found to have PE.  On diprivan currently at 21 mg per hour which is providing:  554 kcals daily.  Discussed with RN.  Patient is tolerating TF well and has required increased diprivan last night.    Height: Ht Readings from Last 1 Encounters:  02/25/13 5\' 2"  (1.575 m)    Weight Status:   Wt Readings from Last 1 Encounters:  02/27/13 204 lb 5.9 oz (92.7 kg)    Re-estimated needs:  Kcal: 1605 Protein: 80-90 gm Fluid: >1.8L  Skin: red peri area of buttocks, intact  Diet Order: NPO   Intake/Output Summary (Last 24 hours) at 02/27/13 1150 Last data filed at 02/27/13 1000  Gross per 24 hour  Intake 2803.55 ml  Output    725 ml  Net  2078.55 ml    Last BM: 8/31   Labs:   Recent Labs Lab 02/24/13 1130 02/25/13 0445 02/26/13 0505 02/27/13 0415  NA  --  139 138 136  K  --  4.4 4.6 4.0  CL  --  115* 114* 112  CO2  --  18* 14* 15*  BUN  --  22 17 21   CREATININE  --  0.83 0.82 0.92  CALCIUM  --  8.7 8.3* 8.2*  MG 1.4* 2.1  --  1.7  PHOS 2.7 1.8*  --  2.2*  GLUCOSE  --  144* 165* 198*    CBG (last 3)   Recent Labs  02/27/13 0047 02/27/13 0502 02/27/13 0756  GLUCAP 155* 175* 168*    Scheduled Meds: . albumin human  12.5 g Intravenous Once  . antiseptic oral rinse  15 mL Mouth Rinse BID  . antiseptic oral rinse  15 mL Mouth Rinse QID  . aspirin  81 mg Per Tube Daily  . chlorhexidine  15 mL Mouth Rinse BID  . ciprofloxacin  500 mg Oral BID  . furosemide  40 mg Intravenous BID  . hydrocortisone sod succinate (SOLU-CORTEF) inj  50 mg Intravenous Q8H  . magnesium sulfate 1 - 4 g bolus IVPB  2 g Intravenous Once  . metroNIDAZOLE  500 mg Oral Q8H  . multivitamin  5 mL Per Tube Daily  .  potassium phosphate IVPB (mmol)  10 mmol Intravenous Once  . Racepinephrine HCl  0.5 mL Nebulization Once  . saccharomyces boulardii  250 mg Oral BID  . sodium phosphate  Dextrose 5% IVPB  30 mmol Intravenous Once    Continuous Infusions: . feeding supplement (VITAL AF 1.2 CAL) 1,000 mL (02/26/13 1348)  . fentaNYL infusion INTRAVENOUS 150 mcg/hr (02/27/13 1000)  . heparin 800 Units/hr (02/27/13 1000)  . phenylephrine (NEO-SYNEPHRINE) Adult infusion Stopped (02/26/13 1600)  . propofol 21 mcg/kg/min (02/27/13 1114)    Oran Rein, RD, LDN Clinical Inpatient Dietitian Pager:  [phone] Weekend and after hours pager:  [phone]

## 2013-02-27 NOTE — Progress Notes (Signed)
ANTICOAGULATION CONSULT NOTE - Follow-up Consult  Pharmacy Consult for Heparin Indication: PE  No Known Allergies  Patient Measurements: Height: 5\' 2"  (157.5 cm) Weight: 204 lb 5.9 oz (92.7 kg) IBW/kg (Calculated) : 50.1 Heparin Dosing Weight: 70 kg  Vital Signs: Temp: 98.4 F (36.9 C) (08/31 0400) Temp src: Axillary (08/31 0400) BP: 101/56 mmHg (08/31 0327) Pulse Rate: 54 (08/31 0738)  Labs:  Recent Labs  02/25/13 0445 02/25/13 1050  02/25/13 2140 02/26/13 0505 02/26/13 0940 02/26/13 1330 02/26/13 2000 02/27/13 0415  HGB 9.2*  --   --   --  10.3*  --   --   --  9.2*  HCT 28.1*  --   --   --  31.0*  --   --   --  28.2*  PLT 258  --   --   --  278  --   --   --  257  LABPROT 12.9  --   --   --   --   --   --   --   --   INR 0.99  --   --   --   --   --   --   --   --   HEPARINUNFRC  --   --   --   --   --   --  0.58 0.59 0.73*  CREATININE 0.83  --   --   --  0.82  --   --   --  0.92  CKTOTAL  --  401*  --   --   --   --   --   --   --   CKMB  --  9.6*  --   --   --   --   --   --   --   TROPONINI <0.30 <0.30  < > 1.39* 2.84* 1.92*  --   --   --   < > = values in this interval not displayed.  Estimated Creatinine Clearance: 64.6 ml/min (by C-G formula based on Cr of 0.92).   Medical History: Past Medical History  Diagnosis Date  . IBS (irritable bowel syndrome)   . Diverticulitis   . Hypertension   . Fibromyalgia     Medications:  Scheduled:  . albumin human  12.5 g Intravenous Once  . antiseptic oral rinse  15 mL Mouth Rinse BID  . antiseptic oral rinse  15 mL Mouth Rinse QID  . aspirin  81 mg Per Tube Daily  . chlorhexidine  15 mL Mouth Rinse BID  . ciprofloxacin  500 mg Oral BID  . hydrocortisone sod succinate (SOLU-CORTEF) inj  50 mg Intravenous Q6H  . metroNIDAZOLE  500 mg Oral Q8H  . multivitamin  5 mL Per Tube Daily  . Racepinephrine HCl  0.5 mL Nebulization Once  . saccharomyces boulardii  250 mg Oral BID  . sodium phosphate  Dextrose 5% IVPB   30 mmol Intravenous Once   Infusions:  . feeding supplement (VITAL AF 1.2 CAL) 1,000 mL (02/26/13 1348)  . fentaNYL infusion INTRAVENOUS 75 mcg/hr (02/27/13 0738)  . heparin 900 Units/hr (02/27/13 0700)  . phenylephrine (NEO-SYNEPHRINE) Adult infusion Stopped (02/26/13 1600)  . propofol 25 mcg/kg/min (02/27/13 1610)    Assessment: 67 yr female admitted 8/27 with persistent hypotension, suspected gastroenteritis, acute renal failure and hypovolemic shock.  8/29 pt developed atypical chest pain with elevated troponins and respiratory distress requiring intubation.  RLL PE also noted on PE.  IV heparin  started 8/30 with first two heparin levels therapeutic.     8/31: HL slightly supratherapeutic @ 0.73 (goal 0.3-0.7).    Small decrease in hgb, plts ok.  No issues per RN noted.   SCr WNL, CrCl ~83ml/min.  Goal of Therapy:  Heparin level 0.3-0.7 units/ml Monitor platelets by anticoagulation protocol: Yes   Plan:   Reduce heparin infusion rate to 800 units/hr = 8 ml/hr  Check heparin level in 6 hrs  Check heparin level and CBC daily while on heparin  Delvina Mizzell, Riccardo Dubin, PharmD 02/27/2013,7:49 AM

## 2013-02-27 NOTE — Progress Notes (Signed)
PHARMACY CONSULT FOR HEPARIN - FOLLOW UP  Heparin level = 0.37 on 800 units/hr which is within goal 0.3-0.7 for ACS/PE. However, level has decreased significantly from 0.73 this am on 900 units/hr. Concerned that this downward trend will continue.  No bleeding reported per RN.  Plan:  Increase heparin to 850 units/hr  Recheck heparin level and CBC in am  Loralee Pacas, PharmD, BCPS Pager: [phone] 02/27/2013 5:01 PM

## 2013-02-27 NOTE — Progress Notes (Signed)
PULMONARY  / CRITICAL CARE MEDICINE  Name: [name] MRN: [id number] DOB: 1946/01/22    ADMISSION DATE:  02/23/2013  REFERRING MD :  EDP PRIMARY SERVICE: PCCM  REASON FOR ADMISSION: persistent hypotension after volume resuscitation  BRIEF PATIENT DESCRIPTION: 67 F with hx of IBS admitted via [facility] ED with 2-3 wks of N/V, anorexia, severe diarrhea and 2-3 days of weakness, HA. Remained hypotensive despite 4 liters NS in ED.  Admitted with suspected gastroenteritis, hypovolemic shock, r/o septic shock. ARI, hyponatremia  67F with PMH of IBS, chronic diarrhea, duodenal stricture repair presented to ED as noted above. She remained hypotensive despite 4L NS. Also c/o blood on tissue after wiping. Describes stools as watery but not frankly bloody. Has recently been started on an anti-hypertensive medication. Unsure name of it. Describes profund weakness on day of admission. Also c/o worsening HA over past 2-3 days. Denies fever, chills, sweats, CP, cough, SOB, sputum, hemoptysis, photophobia, nuchal rigidity. Has chronic intermittent LE edema - not worse than baseline.      LINES / TUBES: L IJ CVL 8/26 >>  ETT 02/25/13 Emergent ? Stridor but in setting of NSTEMI >>  CULTURES: MRSA PCR 02/22/2013 is negative C diff 8/26 >>  NEG final Stool 8/26 >>  Urine 8/26 >> negative final Blood 8/26 >> negative as of 02/27/2013       ANTIBIOTICS: Vancomycin and Zosyn 02/23/2013  > 02/23/2013 Ciprofloxacin 8/26 >>  (7 days stop date is 03/31/2013) Metronidazole (PO) 8/26 >>  (7 days stop date is 03/02/2013)       SIGNIFICANT EVENTS / STUDIES:  8./28 - c diff negative. Still on pressors. Hungry and wants to eat. C/o left arm pain and many somatic complaints. CVP 14.   8/29 - sigmodoiscope cancellend due to chest pain. Now resolved. EKG normal. Crying. Wants to go home -against medical advice. Says does not like bed. Adamant about going home. No diarrhea x 1 day  Patieint home pills NOS  found at bedside at 1am today: identified as ambien, xanax. Since then wanting to go AMA and also off pressors since 4am today   02/26/13 - Was adamant she would go out AMA yesterday and I had convinced her to stay in hospital after striking a deal that we would monitor her in the floor. Ihad warned her that patients who sign out AMA from ICU haave a very high risk of dying but she did nto seem to care. Overnight, appears troponins positive, chest pain continued, resp disterss (Stridor per bedside PCCM) and intubated.  Noted to have metabolic acidosis with poor resp comp. Currently sedated and on pressors. Unclear if she had access to her home CNS meds in the floor    SUBJECTIVE/OVERNIGHT/INTERVAL HX 02/27/2013: Pulmonary embolism diagnosed. She's on IV heparin. She is off pressors after getting a fluid bolus and start hydrocortisone. This morning she failed wake up assessment due to significant agitation  VITAL SIGNS: Temp:  [97.7 F (36.5 C)-98.6 F (37 C)] 97.7 F (36.5 C) (08/31 0800) Pulse Rate:  [54-146] 106 (08/31 1000) Resp:  [17-33] 22 (08/31 1000) BP: (101-124)/(56-74) 101/56 mmHg (08/31 0327) SpO2:  [53 %-100 %] 96 % (08/31 1000) FiO2 (%):  [30 %-100 %] 30 % (08/31 1000) Weight:  [92.7 kg (204 lb 5.9 oz)] 92.7 kg (204 lb 5.9 oz) (08/31 0600) HEMODYNAMICS: CVP:  [15 mmHg] 15 mmHg VENTILATOR SETTINGS: Vent Mode:  [-] PRVC FiO2 (%):  [30 %-100 %] 30 % Set Rate:  [22 bmp]  22 bmp Vt Set:  [500 mL] 500 mL PEEP:  [5 cmH20] 5 cmH20 Pressure Support:  [10 cmH20] 10 cmH20 Plateau Pressure:  [19 cmH20-31 cmH20] 19 cmH20 INTAKE / OUTPUT: Intake/Output     08/30 0701 - 08/31 0700 08/31 0701 - 09/01 0700   P.O.     I.V. (mL/kg) 2300.7 (24.8) 116.6 (1.3)   NG/GT 595 165   Total Intake(mL/kg) 2895.7 (31.2) 281.6 (3)   Urine (mL/kg/hr) 715 (0.3) 55 (0.2)   Total Output 715 55   Net +2180.7 +226.6        Stool Occurrence 1 x      PHYSICAL EXAMINATION: General:  Critically ill  looking Pyshch: Sedated Neuro:  RASS -2 on diprivan and fent gtt easily gets agitated on wake up assessment HEENT: NCAT, EOMI, PERRL Cardiovascular: RRR s M Lungs: CTAP Abdomensoft, non tender, no organ megally, normal bowel sounds. No diarrhea Ext: cool, diminished DP pulses, no edema Skin: no rashes, lesions MSK: No restriction to passive range of LUE and pulses well felt  LABS: PULMONARY  Recent Labs Lab 02/26/13 0100 02/26/13 0414 02/26/13 0940 02/26/13 0947  PHART 7.223* 7.410  --  7.400  PCO2ART 38.5 23.6*  --  24.5*  PO2ART 437.0* 242.0*  --  166.0*  HCO3 15.3* 14.7*  --  14.9*  TCO2 14.5 13.5  --  13.7  O2SAT 97.7 97.8 62.1 97.3    CBC  Recent Labs Lab 02/25/13 0445 02/26/13 0505 02/27/13 0415  HGB 9.2* 10.3* 9.2*  HCT 28.1* 31.0* 28.2*  WBC 5.8 13.5* 11.5*  PLT 258 278 257    COAGULATION  Recent Labs Lab 02/25/13 0445  INR 0.99    CARDIAC    Recent Labs Lab 02/25/13 1050 02/25/13 1518 02/25/13 2140 02/26/13 0505 02/26/13 0940  TROPONINI <0.30 <0.30 1.39* 2.84* 1.92*    Recent Labs Lab 02/27/13 0415  PROBNP 19190.0*     CHEMISTRY  Recent Labs Lab 02/23/13 2000 02/24/13 0410 02/24/13 1130 02/25/13 0445 02/26/13 0505 02/27/13 0415  NA 131* 132*  --  139 138 136  K 3.8 3.4*  --  4.4 4.6 4.0  CL 101 105  --  115* 114* 112  CO2 13* 14*  --  18* 14* 15*  GLUCOSE 82 194*  --  144* 165* 198*  BUN 82* 64*  --  22 17 21   CREATININE 3.93* 2.28*  --  0.83 0.82 0.92  CALCIUM 6.6* 6.6*  --  8.7 8.3* 8.2*  MG  --   --  1.4* 2.1  --  1.7  PHOS  --   --  2.7 1.8*  --  2.2*   Estimated Creatinine Clearance: 64.6 ml/min (by C-G formula based on Cr of 0.92).   LIVER  Recent Labs Lab 02/23/13 1232 02/24/13 0410 02/25/13 0445  AST 46* 34  --   ALT 25 21  --   ALKPHOS 116 77  --   BILITOT 0.1* 0.1*  --   PROT 6.7 5.4*  --   ALBUMIN 3.0* 2.2*  --   INR  --   --  0.99     INFECTIOUS  Recent Labs Lab 02/23/13 1200   02/24/13 0410 02/25/13 0445 02/26/13 1040 02/27/13 0415  LATICACIDVEN  --   < >  --  0.7 <0.2* 0.9  PROCALCITON 6.21  --  1.06 0.24  --   --   < > = values in this interval not displayed.   ENDOCRINE CBG (last 3)  Recent Labs  02/27/13 0047 02/27/13 0502 02/27/13 0756  GLUCAP 155* 175* 168*         IMAGING x48h  Dg Abd 1 View  02/26/2013   *RADIOLOGY REPORT*  Clinical Data: Status post nasogastric catheter placement  ABDOMEN - 1 VIEW  Comparison: 02/23/2013  Findings: A nasogastric catheter is now noted within the mid stomach.  Scattered large and small bowel gas is noted.  No definitive obstructive changes are seen.  The osseous structures are stable.  IMPRESSION: Nasogastric catheter within the stomach.   Original Report Authenticated By: Alcide Clever, M.D.   Ct Angio Chest Pe W/cm &/or Wo Cm  02/26/2013   *RADIOLOGY REPORT*  Clinical Data: Short of breath.  Rule out pulmonary embolism. Respiratory distress  CT ANGIOGRAPHY CHEST  Technique:  Multidetector CT imaging of the chest using the standard protocol during bolus administration of intravenous contrast. Multiplanar reconstructed images including MIPs were obtained and reviewed to evaluate the vascular anatomy.  Contrast: OMNIPAQUE IOHEXOL 350 MG/ML SOLN  Comparison: Chest x-ray 02/25/2013  Findings: Image quality limited by motion.  The patient was not able to hold still.  The patient is intubated.  There are bilateral pleural effusions and bibasilar dependent atelectasis.  Small filling defect is present in the pulmonary artery supplying the superior segment of the right lower lobe.  This is best seen on axial thin sections and is seen on multiple images.  No other pulmonary emboli are identified.  Coronary calcification.  Heart size within normal limits.  Mild airspace disease bilaterally consistent with residual pulmonary edema.  No acute bony change.  IMPRESSION: Small segmental pulmonary embolism to the superior  segment right lower lobe.  Bilateral effusions and bibasilar atelectasis.  Mild pulmonary edema.  Critical Value/emergent results were called by telephone at the time of interpretation on 02/26/2013 at 1223 hours to Dr. Maple Hudson, who verbally acknowledged these results.   Original Report Authenticated By: Janeece Riggers, M.D.   Dg Chest Port 1 View  02/27/2013   *RADIOLOGY REPORT*  Clinical Data: 67 year old female with respiratory failure - endotracheal tube retracted.  PORTABLE CHEST - 1 VIEW  Comparison: 02/27/2013  Findings: The endotracheal tube has been slightly retracted with tip now 2.1 cm above the carina - satisfactory. An NG tube entering the stomach and left IJ central venous catheter with tip overlying the medial left brachiocephalic vein again identified. Pulmonary vascular congestion, left upper and lower lobe atelectasis, mild pulmonary edema and probable small effusions again noted. There is no evidence of pneumothorax.  IMPRESSION: Slight interval retraction of endotracheal tube now with tip in satisfactory position.  Continued scattered areas of left lung atelectasis, pulmonary vascular congestion/edema and effusions.   Original Report Authenticated By: Harmon Pier, M.D.   Dg Chest Port 1 View  02/27/2013   *RADIOLOGY REPORT*  Clinical Data: Evaluate endotracheal tube placement.  PORTABLE CHEST - 1 VIEW  Comparison: Chest x-ray 02/25/2013.  Findings: Endotracheal tube is in the low position with tip at the carina directed slightly toward the left mainstem bronchus. There is a left-sided internal jugular central venous catheter with tip terminating in the proximal superior vena cava. A nasogastric tube is seen extending into the stomach, however, the tip of the nasogastric tube extends below the lower margin of the image.  The lung volumes are low.  Worsening opacity in the left base, favored to reflect left lower lobe atelectasis, although some underlying air space consolidation is not excluded.   Possible small left pleural effusion.  There is cephalization of the pulmonary vasculature and slight indistinctness of the interstitial markings suggestive of mild pulmonary edema.  Heart size is borderline large.  Upper mediastinal contours are within normal limits.  IMPRESSION: 1.  Support apparatus, as above.  Low position of endotracheal tube which is at the carina directed toward the left mainstem bronchus. This should be withdrawn 4 cm for more optimal placement. 2.  Interval development of probable left lower lobe atelectasis. 3.  Possible small left pleural effusion.  Mild interstitial pulmonary edema.  Critical Value/emergent results were called by telephone at the time of interpretation on 02/27/2013 at 07:45 a.m. to nurse Marylene Land (for Dr. Marchelle Gearing), who verbally acknowledged these results.   Original Report Authenticated By: Trudie Reed, M.D.   Dg Chest Port 1 View  02/26/2013   CLINICAL DATA:  Intubation.  EXAM: PORTABLE CHEST - 1 VIEW  COMPARISON:  02/23/2013  FINDINGS: Left central line remains in stable position. Endotracheal tube has been placed and is 2 cm above the carinal. Bilateral perihilar airspace opacities, slightly more pronounced in the upper lobes. This could represent edema, possibly negative pressure edema. The heart is normal size. No effusions. No acute bony abnormality.  IMPRESSION: Endotracheal tube 2 cm above the carinal.  New perihilar and upper lobe opacities, question edema.   Electronically Signed   By: Charlett Nose   On: 02/26/2013 00:06       ASSESSMENT / PLAN:  PULMONARY A: VDRF 02/25/13 due to metabolic acidosis//poor resp compensation in setting of NSTEMI. Stridor reported but no abnormalities on intubation. ? Had acute pulm edema v negative pressure edema from VCD (likely latter)   02/27/2013:  Pulmonary embolism confirmed and patient is on IV heparin. Well spontaneous breathing trial criteria due to agitation  P:   Full vent support Continue IV  heparin  CARDIOVASCULAR A: H/O hypertension Hypotension, presumed hypovolemic - r/o septic   02/27/2013: Diagnosis change from non-STEMI  To pulmonary embolism. Hypertensive currently and no pulse pressure variation. Off pressors  P:  Start diuresis Await echo report; if abnormal call cardiology again Will need outpatient cardiology workup to be on the safe side    RENAL A:  Acute renal failure, nonoliguric - resolved Metabolic acidosis - non gap due to diarhhea - persists mild   02/26/13 - metabolic acidosis with poor resp comp at time of intubation.  02/27/2013 hypomagnesemia and hypophosphatemia present, both mild  P:   Replacement medium and phosphorus Maintain hemodynamics Mionitor lytes   GASTROINTESTINAL A:  H/O IBS Nausea, anorexia Profound diarrhea Heme+ stool (card +, not grossly) - suspect from external irritation   02/24/13 - diarrhea better. C diff neative 02/25/13 - diarrhea resolved. GI canceld sig scope due to chest pain and  ? Due to poor prep 02/27/13 - no diarrhea  P:   Per gi  Dr Orvil Feil consult for tube feeds  HEMATOLOGIC A:  No issues P:  - PRBC for hgb </= 6.9gm%    - exceptions are   -  if ACS susepcted/confirmed then transfuse for hgb </= 8.0gm%,  or    -  If septic shock first 24h and scvo2 < 70% then transfuse for hgb </= 9.0gm%   - active bleeding with hemodynamic instability, then transfuse regardless of hemoglobin value   At at all times try to transfuse 1 unit prbc as possible with exception of active hemorrhage    INFECTIOUS A:  Suspected gastroenteritis Suspected severe sepsis, not sure   P:  PCT algorithm, if increasing will expand coverage for aspiration pneumonia Po flagyl  X 7 days and Change IV to po cipro x total 7 days; stopped it and placed    ENDOCRINE A:  Reported history of hypothyroidism. Not on levothyroxine. TSH 3.9 on 02/24/13  02/26/13 - back on presssors with intubation. But also correlates with  coming off hydrocort 02/27/2013: Off pressors after fluid bolus  P:   Monitor off synthroid Empiric hydrocort; wean down dose 02/26/2013   NEUROLOGIC A: Chronic pain and anxiety  02/25/13 - discovered t surreptiously using several CNS drugs at bedside and since then wanting to go AMA.  02/26/13 - sedated with diprivan and fentanyl  P:   Diprivan and fentanyl gtt Monitor lactate and CK while on dirpivan  GLOBAL 02/25/13 - d/w patient. She has agreed to say 1-2 days more and if stable go home 02/26/13 or 02/27/13 from floor. She is not sure about staying behind for GI scope but will follow as opd. She wants to eat regular diet. Move to med surg  02/26/13 - pulmonary embolism diagnosed  02/27/2013: Husband updated at bedside by registered nurse. CODE STATUS is full code      Dr. Kalman Shan, M.D., [facility].C.P Pulmonary and Critical Care Medicine Staff Physician [facility]  Pulmonary and Critical Care Pager: [phone], If no answer or between  15:00h - 7:00h: call [phone]  02/27/2013 10:19 AM

## 2013-02-28 ENCOUNTER — Encounter (HOSPITAL_COMMUNITY): Payer: Self-pay | Admitting: Nurse Practitioner

## 2013-02-28 ENCOUNTER — Inpatient Hospital Stay (HOSPITAL_COMMUNITY): Payer: Medicare Other

## 2013-02-28 DIAGNOSIS — I2699 Other pulmonary embolism without acute cor pulmonale: Secondary | ICD-10-CM

## 2013-02-28 DIAGNOSIS — I5021 Acute systolic (congestive) heart failure: Secondary | ICD-10-CM

## 2013-02-28 DIAGNOSIS — K922 Gastrointestinal hemorrhage, unspecified: Secondary | ICD-10-CM

## 2013-02-28 LAB — CBC WITH DIFFERENTIAL/PLATELET
Eosinophils Absolute: 0 10*3/uL (ref 0.0–0.7)
Eosinophils Relative: 0 % (ref 0–5)
Lymphs Abs: 2.3 10*3/uL (ref 0.7–4.0)
MCH: 27.3 pg (ref 26.0–34.0)
MCHC: 32.6 g/dL (ref 30.0–36.0)
MCV: 83.7 fL (ref 78.0–100.0)
Monocytes Absolute: 1.1 10*3/uL — ABNORMAL HIGH (ref 0.1–1.0)
Platelets: 267 10*3/uL (ref 150–400)
RBC: 3.37 MIL/uL — ABNORMAL LOW (ref 3.87–5.11)
RDW: 16.4 % — ABNORMAL HIGH (ref 11.5–15.5)

## 2013-02-28 LAB — PHOSPHORUS: Phosphorus: 2.9 mg/dL (ref 2.3–4.6)

## 2013-02-28 LAB — STOOL CULTURE: Special Requests: NORMAL

## 2013-02-28 LAB — BASIC METABOLIC PANEL
BUN: 25 mg/dL — ABNORMAL HIGH (ref 6–23)
CO2: 19 mEq/L (ref 19–32)
Calcium: 7.6 mg/dL — ABNORMAL LOW (ref 8.4–10.5)
Creatinine, Ser: 0.86 mg/dL (ref 0.50–1.10)
Glucose, Bld: 167 mg/dL — ABNORMAL HIGH (ref 70–99)

## 2013-02-28 LAB — GLUCOSE, CAPILLARY

## 2013-02-28 LAB — HEPARIN LEVEL (UNFRACTIONATED): Heparin Unfractionated: 0.17 IU/mL — ABNORMAL LOW (ref 0.30–0.70)

## 2013-02-28 MED ORDER — POTASSIUM CHLORIDE 20 MEQ/15ML (10%) PO LIQD
40.0000 meq | Freq: Two times a day (BID) | ORAL | Status: DC
  Administered 2013-02-28 – 2013-03-02 (×6): 40 meq
  Filled 2013-02-28 (×9): qty 30

## 2013-02-28 MED ORDER — PRO-STAT SUGAR FREE PO LIQD
30.0000 mL | Freq: Every day | ORAL | Status: DC
  Administered 2013-02-28: 30 mL
  Filled 2013-02-28 (×2): qty 30

## 2013-02-28 MED ORDER — FUROSEMIDE 10 MG/ML IJ SOLN
40.0000 mg | Freq: Three times a day (TID) | INTRAMUSCULAR | Status: DC
  Administered 2013-02-28 – 2013-03-01 (×3): 40 mg via INTRAVENOUS
  Filled 2013-02-28 (×3): qty 4

## 2013-02-28 MED ORDER — PRO-STAT SUGAR FREE PO LIQD
60.0000 mL | Freq: Two times a day (BID) | ORAL | Status: DC
  Administered 2013-02-28 – 2013-03-01 (×2): 60 mL
  Filled 2013-02-28 (×3): qty 60

## 2013-02-28 MED ORDER — VITAL AF 1.2 CAL PO LIQD
1000.0000 mL | ORAL | Status: DC
  Administered 2013-02-28: 1000 mL
  Filled 2013-02-28: qty 1000

## 2013-02-28 MED ORDER — POTASSIUM CHLORIDE 20 MEQ/15ML (10%) PO LIQD
20.0000 meq | ORAL | Status: AC
  Administered 2013-02-28 (×2): 20 meq
  Filled 2013-02-28 (×2): qty 15

## 2013-02-28 MED ORDER — METOPROLOL TARTRATE 1 MG/ML IV SOLN
3.0000 mg | INTRAVENOUS | Status: DC
  Administered 2013-02-28 – 2013-03-01 (×7): 3 mg via INTRAVENOUS
  Filled 2013-02-28 (×15): qty 5

## 2013-02-28 MED ORDER — PANTOPRAZOLE SODIUM 40 MG IV SOLR
40.0000 mg | INTRAVENOUS | Status: DC
  Administered 2013-02-28 – 2013-03-01 (×2): 40 mg via INTRAVENOUS
  Filled 2013-02-28 (×4): qty 40

## 2013-02-28 MED ORDER — ATORVASTATIN CALCIUM 10 MG PO TABS
10.0000 mg | ORAL_TABLET | Freq: Every day | ORAL | Status: DC
  Administered 2013-02-28 – 2013-03-07 (×8): 10 mg via ORAL
  Filled 2013-02-28 (×10): qty 1

## 2013-02-28 MED ORDER — MAGNESIUM SULFATE 40 MG/ML IJ SOLN
2.0000 g | Freq: Once | INTRAMUSCULAR | Status: AC
  Administered 2013-02-28: 2 g via INTRAVENOUS
  Filled 2013-02-28: qty 50

## 2013-02-28 NOTE — Consult Note (Signed)
CARDIOLOGY CONSULT NOTE  Patient ID: [name] MRN: [id number], DOB/AGE: [birth year]   Admit date: 02/23/2013 Date of Consult: 02/28/2013  Primary Physician: Pcp Not In System Primary Cardiologist: new  Pt. Profile  67 y/o female without prior cardiac hx who was admitted on 8/27 with diarrhea and hypotension and subsequently developed pulmonary edema with troponin elevation and wall motion abnormalities on echo.  Problem List  Past Medical History  Diagnosis Date  . IBS (irritable bowel syndrome)   . Diverticulitis   . Hypertension   . Fibromyalgia   . Systolic dysfunction, left ventricle     a. 01/2013 Echo: Sev HK of mid-dist antsept, ant, apical walls; mid-dist lat/inflat/inf/infsept AK, Gr 3 DD, Mild MR.  . NSTEMI (non-ST elevated myocardial infarction)     a. 01/2013  . PE (pulmonary embolism)     a. 01/2013 CTA Chest: small segmental PE to the superior segment of RLL.    Past Surgical History  Procedure Laterality Date  . Abdominal hysterectomy    . Stomach surgery      Allergies  No Known Allergies  HPI   67 y/o female without prior cardiac history.  She does have a h/o GI issues including chronic diarrhea in the setting of IBS.  She presented to the [facility] ED on 8/27 with a 2-3 wk h/o n/v/anorexia, and severe diarrhea - all associated with progressive wkns over a 3-4 day period prior to admission.  She also reported diffuse abd discomfort and brbpr.  In the ED, she was hypotensive with pressures in the 70's.  She was aggressively hydrated w/o significant improvement in BP.  She was admitted with suspected gastroenteritis, hypovolemic shock, acute renal failure (Creat 7.23), and hyponatremia.  Procalcitonin was elevated @ 6.21.  She was placed on empiric abx and vasopressors as needed.  Home lasix dose held, and GI was consulted.  Stool cultures were negative.  With hydration, creatinine improved.  On 8/29, she was scheduled for sigmoidoscopy however was complaining of c/p  and thus sigmoidoscopy was cancelled.  CE were sent off and troponin, which was normal on admission, rose to a peak of 2.84 (8/30).  ECG on the morning of 8/29 showed sinus tach w/o acute st/t changes.  Chest pain was initially felt to be atypical and eventually resolved.  Pt threatened to leave AMA but decided to stay.  On the evening of 8/29 @ 22:20, she developed recurrent chest pain and dyspnea.  She was diaphoretic and desaturated.  She was initially treated with a neb but became unresponsive and required intubation.  PCXR showed pulmonary edema. F/u ECG showed persistent sinus tach with new, deep S waves in I, small Q's in III.  She was treated with IV lasix and on 8/30, CTA of the chest showed a small segmental PE of the superior segment of RLL.  Heparin was initiated.  F/U 2D echo showed multiple WMA's with nl RV fxn.  We've been asked to consult.  Pt remains intubated and sedated.  Family @ bedside.  Inpatient Medications  . albumin human  12.5 g Intravenous Once  . antiseptic oral rinse  15 mL Mouth Rinse BID  . antiseptic oral rinse  15 mL Mouth Rinse QID  . aspirin  81 mg Per Tube Daily  . atorvastatin  10 mg Oral q1800  . chlorhexidine  15 mL Mouth Rinse BID  . ciprofloxacin  500 mg Oral BID  . feeding supplement  30 mL Oral BID  . furosemide  40 mg Intravenous Q8H  . metoprolol  3 mg Intravenous Q3H  . metroNIDAZOLE  500 mg Oral Q8H  . multivitamin  5 mL Per Tube Daily  . potassium chloride  40 mEq Per Tube BID  . Racepinephrine HCl  0.5 mL Nebulization Once  . saccharomyces boulardii  250 mg Oral BID  . sodium phosphate  Dextrose 5% IVPB  30 mmol Intravenous Once   Family History - obtained from brother and son. Family History  Problem Relation Age of Onset  . Other Father     alive and well @ 22  . Other Paternal Grandmother     lived into her hundreds  . Other Mother     died of substance abuse issues.     Social History - obtained from previously recorded history.   Unable to obtain from pt who is intubated and sedated. History   Social History  . Marital Status: Married    Spouse Name: N/A    Number of Children: N/A  . Years of Education: N/A   Occupational History  . Not on file.   Social History Main Topics  . Smoking status: Never Smoker   . Smokeless tobacco: Never Used  . Alcohol Use: 0.6 oz/week    1 Cans of beer per week  . Drug Use: No  . Sexual Activity: No   Other Topics Concern  . Not on file   Social History Narrative   Lives in [place] with her husband.    Review of Systems - unable to obtain 2/2 intubated/sedated status.  General:  No chills, fever, night sweats or weight changes.  Cardiovascular:  No chest pain, dyspnea on exertion, edema, orthopnea, palpitations, paroxysmal nocturnal dyspnea. Dermatological: No rash, lesions/masses Respiratory: No cough, dyspnea Urologic: No hematuria, dysuria Abdominal:   No nausea, vomiting, diarrhea, bright red blood per rectum, melena, or hematemesis Neurologic:  No visual changes, wkns, changes in mental status. All other systems reviewed and are otherwise negative except as noted above.  Physical Exam  Blood pressure 152/90, pulse 130, temperature 98.9 F (37.2 C), temperature source Oral, resp. rate 22, height 5\' 2"  (1.575 m), weight 203 lb 0.7 oz (92.1 kg), SpO2 100.00%.  General: Intubated, sedated. Psych:  Intubated, sedated. Neuro:  Intubated, sedated. HEENT: Normal  Neck: Supple without bruits or JVD. Lungs:  Resp regular and unlabored, diminished in bases but otw cta. Heart: RRR no s3, s4, or murmurs. Abdomen: Soft, non-tender, non-distended, BS + x 4.  Extremities: No clubbing, cyanosis or edema. DP/PT/Radials 2+ and equal bilaterally.  Labs  Recent Labs  02/25/13 1518 02/25/13 2140 02/26/13 0505 02/26/13 0940  TROPONINI <0.30 1.39* 2.84* 1.92*   Lab Results  Component Value Date   WBC 15.1* 02/28/2013   HGB 9.2* 02/28/2013   HCT 28.2* 02/28/2013   MCV 83.7  02/28/2013   PLT 267 02/28/2013    Recent Labs Lab 02/24/13 0410  02/28/13 0505  NA 132*  < > 139  K 3.4*  < > 3.3*  CL 105  < > 108  CO2 14*  < > 19  BUN 64*  < > 25*  CREATININE 2.28*  < > 0.86  CALCIUM 6.6*  < > 7.6*  PROT 5.4*  --   --   BILITOT 0.1*  --   --   ALKPHOS 77  --   --   ALT 21  --   --   AST 34  --   --   GLUCOSE 194*  < >  167*  < > = values in this interval not displayed. Lab Results  Component Value Date   TRIG 146 02/28/2013   Radiology/Studies  Ct Abdomen Pelvis Wo Contrast  02/23/2013   *RADIOLOGY REPORT*  Clinical Data: Diarrhea and abdominal pain  CT ABDOMEN AND PELVIS WITHOUT CONTRAST   IMPRESSION: Dilated gallbladder although no wall thickening or pericholecystic fluid is noted.  No cholelithiasis is seen.  No acute abnormality is noted.   Original Report Authenticated By: Alcide Clever, M.D.   Dg Abd 1 View  02/23/2013   *RADIOLOGY REPORT*  Clinical Data: Abdominal pain, weakness  ABDOMEN - 1 VIEW  IMPRESSION: Mild gaseous distension of a loop of small bowel left mid hemiabdomen, nonspecific but could be seen in the setting of early small bowel obstruction.  Clinical correlation is advised.   Original Report Authenticated By: Tacey Ruiz, MD   Ct Angio Chest Pe W/cm &/or Wo Cm  02/26/2013   *RADIOLOGY REPORT*  Clinical Data: Short of breath.  Rule out pulmonary embolism. Respiratory distress  CT ANGIOGRAPHY CHEST   IMPRESSION: Small segmental pulmonary embolism to the superior segment right lower lobe.  Bilateral effusions and bibasilar atelectasis.  Mild pulmonary edema.  Critical Value/emergent results were called by telephone at the time of interpretation on 02/26/2013 at 1223 hours to Dr. Maple Hudson, who verbally acknowledged these results.   Original Report Authenticated By: Janeece Riggers, M.D.   US Abdomen Port  02/24/2013   *RADIOLOGY REPORT*  Clinical Data:  Markedly distended gallbladder on CT scan of 02/23/2013. Bilirubin is not elevated.  COMPLETE  ABDOMINAL ULTRASOUND  IMPRESSION: The gallbladder is distended but there are no stones and the wall is not thickened. The gallbladder was distended on the prior ultrasound of 10/16/2012 but not to quite the same degree. There is chronic dilatation of the common bile duct but there are no dilated intrahepatic bile ducts.  The patient's bilirubin is not elevated.   Original Report Authenticated By: Francene Boyers, M.D.   Dg Chest Port 1 View  02/28/2013   *RADIOLOGY REPORT*  Clinical Data: Respiratory failure  PORTABLE CHEST - 1 VIEW  Comparison: 02/27/2013  Findings: The cardiac shadow is stable.  An endotracheal tube, left jugular central line and nasogastric catheter are all stable in appearance.  Mild left basilar atelectasis is noted.  Mild central vascular congestion is noted as well.  The overall appearance however is improved in the interval from prior exam.  IMPRESSION: Improved vascular congestion.  Persistent left basilar changes are seen.   Original Report Authenticated By: Alcide Clever, M.D.   ECG  8/30 - sinus rhythm, 91, no acute st/t changes.  2D Echocardiogram 8.30  IMPRESSION: Small segmental pulmonary embolism to the superior segment right lower lobe.  Bilateral effusions and bibasilar atelectasis.  Mild pulmonary edema. Study Conclusions  - Left ventricle: Wall motion abnormailty suggests ischemic   heart disease and or Stress Cardiomyopathy The cavity size   was normal. There is severe hypokinesis of the   mid-distalanteroseptal and anterior myocardium; consistent   with infarction. There is akinesis of the   mid-distallateral, inferolateral, inferior, and   inferoseptal myocardium; consistent with infarction. There   is moderate hypokinesis of the apical myocardium. Doppler   parameters are consistent with a reversible restrictive   pattern, indicative of decreased left ventricular   diastolic compliance and/or increased left atrial pressure   (grade 3 diastolic  dysfunction). Doppler parameters are   consistent with high ventricular filling pressure. - Aortic valve:  Valve area: 2.34cm^2(VTI). Valve area:   2.55cm^2 (Vmax). - Mitral valve: Mild regurgitation. - Left atrium: The atrium was mildly dilated. - Pericardium, extracardiac: There was a left pleural   effusion.  ASSESSMENT AND PLAN  1.  NSTEMI/Acute Pulmonary Edema:  Pt developed acute resp failure and pulmonary edema in the setting of c/p, req intubation on the evening on 8/29.  ECG during event did not show acute ST/T changes but did show deep S waves in I (? Arm lead reversal as this is no longer present on f/u ECG 8/30).  Troponin's rose to a peak of 2.84 and are trending back down.  She remains intubated and sedated.  Echo on 8/30 shows reduced LV fxn with multiple WMA's suggestive of possible primary cardiac event however CTA of chest also suggest RLL PE.  Cont asa, statin, bb, and heparin.  We will plan on ischemic eval, likely cath, once she shows recovery from acute resp failure.  2.  Acute RLL PE:  See discussion above.  Cont heparin.  Probably cath prior to placing on long term oral anticoagulant.  With threat of leaving AMA and thus ? Compliance, consider Xarelto over coumadin.  3.  Acute Resp Failure:  In setting of #1 and #2.  Vent mgmt per CCM.  4.  Acute Gastroenteritis:  Improving.  Abx per CCM/GI.  5.  Acute renal failure:  In setting of profound dehydration and diarrhea.  Creat nl this AM.  Tolerated contrast on 8/30.  6.  Hypokalemia/Hypomagnesemia:  supp per CCM.  Signed, Nicolasa Ducking, NP 02/28/2013, 2:31 PM

## 2013-02-28 NOTE — Progress Notes (Signed)
[facility] ADULT ICU REPLACEMENT PROTOCOL FOR AM LAB REPLACEMENT ONLY  The patient does apply for the [facility] Adult ICU Electrolyte Replacment Protocol based on the criteria listed below:   1. Is GFR >/= 40 ml/min? yes  Patient's GFR today is 69 2. Is urine output >/= 0.5 ml/kg/hr for the last 6 hours? yes Patient's UOP is 0.8 ml/kg/hr 3. Is BUN < 60 mg/dL? yes  Patient's BUN today is 25 4. Abnormal electrolyte(s):K+3.3   Mg1.8 5. Ordered repletion with: protocol 6. If a panic level lab has been reported, has the CCM MD in charge been notified? yes.   Physician:  Wyline Mood Hilliard 02/28/2013 6:20 AM

## 2013-02-28 NOTE — Progress Notes (Signed)
Pharmacy: Heparin  Heparin level this evening (0.17) Below therapeutic goal of 0.3-0.7 on heparin running at 900 units/hr.  Discussed with RN and heparin gtt running appropriately with no interruptions/issues.  Will not bolus patient given she was supratherapeutic yesterday on 900 units/hr.    Increase heparin gtt cautiously to 1050 units/hr and repeat 6 hour heparin level   [name], Loma Messing PharmD Pager #: [phone] 9:15 PM 02/28/2013

## 2013-02-28 NOTE — Progress Notes (Signed)
02/28/13 1700  Clinical Encounter Type  Visited With Patient not available   Attempted follow-up visit for support per consult, but pt sleeping.  Spiritual Care will follow, but please also page as needed:  [phone].  Thank you.  [address] [place], [place] [phone]

## 2013-02-28 NOTE — Progress Notes (Addendum)
PULMONARY  / CRITICAL CARE MEDICINE  Name: [name] MRN: [id number] DOB: 28-Jul-1945    ADMISSION DATE:  02/23/2013  REFERRING MD :  EDP PRIMARY SERVICE: PCCM  REASON FOR ADMISSION: persistent hypotension after volume resuscitation  BRIEF PATIENT DESCRIPTION: 67 F with hx of IBS admitted via [facility] ED with 2-3 wks of N/V, anorexia, severe diarrhea and 2-3 days of weakness, HA. Remained hypotensive despite 4 liters NS in ED.  Admitted with suspected gastroenteritis, hypovolemic shock, r/o septic shock. ARI, hyponatremia  67F with PMH of IBS, chronic diarrhea, duodenal stricture repair presented to ED as noted above. She remained hypotensive despite 4L NS. Also c/o blood on tissue after wiping. Describes stools as watery but not frankly bloody. Has recently been started on an anti-hypertensive medication. Unsure name of it. Describes profund weakness on day of admission. Also c/o worsening HA over past 2-3 days. Denies fever, chills, sweats, CP, cough, SOB, sputum, hemoptysis, photophobia, nuchal rigidity. Has chronic intermittent LE edema - not worse than baseline.      LINES / TUBES: L IJ CVL 8/26 >>  ETT 02/25/13 Emergent ? Stridor but in setting of NSTEMI v PE>> Aline 02/25/13 >>dc 02/28/13  CULTURES: MRSA PCR 02/22/2013 is negative C diff 8/26 >>  NEG final Stool 8/26 >>  Urine 8/26 >> negative final Blood 8/26 >> negative as of 02/27/2013       ANTIBIOTICS: Vancomycin and Zosyn 02/23/2013  > 02/23/2013 Ciprofloxacin 8/26 >>  (7 days stop date is 03/01/2013) Metronidazole (PO) 8/26 >>  (7 days stop date is 03/02/2013)       SIGNIFICANT EVENTS / STUDIES:  8./28 - c diff negative. Still on pressors. Hungry and wants to eat. C/o left arm pain and many somatic complaints. CVP 14.   8/29 - sigmodoiscope cancellend due to chest pain. Now resolved. EKG normal. Crying. Wants to go home -against medical advice. Says does not like bed. Adamant about going home. No diarrhea x 1  day  Patieint home pills NOS found at bedside at 1am today: identified as ambien, xanax. Since then wanting to go AMA and also off pressors since 4am today   02/26/13 - Was adamant she would go out AMA yesterday and I had convinced her to stay in hospital after striking a deal that we would monitor her in the floor. Ihad warned her that patients who sign out AMA from ICU haave a very high risk of dying but she did nto seem to care. Overnight, appears troponins positive, chest pain continued, resp disterss (Stridor per bedside PCCM) and intubated.  Noted to have metabolic acidosis with poor resp comp. Currently sedated and on pressors. Unclear if she had access to her home CNS meds in the floor    02/27/2013: Pulmonary embolism diagnosed. She's on IV heparin. She is off pressors after getting a fluid bolus and start hydrocortisone. This morning she failed wake up assessment due to significant agitation    SUBJECTIVE/OVERNIGHT/INTERVAL HX 02/28/13 - off presssors still. Normal WUA of sedation but very anxious and tachypneic. Failed SBT due to hypertension/tachycardia/tachypnea. ECHO 02/26/13 report back: suggests focal hpokinesis and low LVEF; focal apical  VITAL SIGNS: Temp:  [97.9 F (36.6 C)-99.1 F (37.3 C)] 99.1 F (37.3 C) (09/01 0800) Pulse Rate:  [67-130] 130 (09/01 0918) Resp:  [21-25] 22 (09/01 0918) BP: (117-134)/(68-78) 126/78 mmHg (09/01 0747) SpO2:  [95 %-100 %] 95 % (09/01 0918) FiO2 (%):  [30 %] 30 % (09/01 0920) Weight:  [92.1 kg (203  lb 0.7 oz)] 92.1 kg (203 lb 0.7 oz) (09/01 0500) HEMODYNAMICS: CVP:  [16 mmHg] 16 mmHg VENTILATOR SETTINGS: Vent Mode:  [-] PRVC FiO2 (%):  [30 %] 30 % Set Rate:  [22 bmp] 22 bmp Vt Set:  [200 mL-500 mL] 200 mL PEEP:  [5 cmH20] 5 cmH20 Pressure Support:  [5 cmH20] 5 cmH20 Plateau Pressure:  [18 cmH20-27 cmH20] 19 cmH20 INTAKE / OUTPUT: Intake/Output     08/31 0701 - 09/01 0700 09/01 0701 - 09/02 0700   I.V. (mL/kg) 1279.8 (13.9) 153.9  (1.7)   NG/GT 900 105   IV Piggyback 268    Total Intake(mL/kg) 2447.8 (26.6) 258.9 (2.8)   Urine (mL/kg/hr) 4525 (2) 525 (1.5)   Total Output 4525 525   Net -2077.2 -266.2        Stool Occurrence 2 x      PHYSICAL EXAMINATION: General:  Critically ill looking Pyshch: anxious Neuro:  RASS 0, CAM-ICU neg for delirum. Moves all 4s. Very anxious. This is off sedation.  HEENT: NCAT, EOMI, PERRL Cardiovascular: RRR s M Lungs: CTAP Abdomensoft, non tender, no organ megally, normal bowel sounds. No diarrhea Ext: cool, diminished DP pulses, no edema Skin: no rashes, lesions MSK: No restriction to passive range of LUE and pulses well felt  LABS: PULMONARY  Recent Labs Lab 02/26/13 0100 02/26/13 0414 02/26/13 0940 02/26/13 0947  PHART 7.223* 7.410  --  7.400  PCO2ART 38.5 23.6*  --  24.5*  PO2ART 437.0* 242.0*  --  166.0*  HCO3 15.3* 14.7*  --  14.9*  TCO2 14.5 13.5  --  13.7  O2SAT 97.7 97.8 62.1 97.3    CBC  Recent Labs Lab 02/26/13 0505 02/27/13 0415 02/28/13 0505  HGB 10.3* 9.2* 9.2*  HCT 31.0* 28.2* 28.2*  WBC 13.5* 11.5* 15.1*  PLT 278 257 267    COAGULATION  Recent Labs Lab 02/25/13 0445  INR 0.99    CARDIAC    Recent Labs Lab 02/25/13 1050 02/25/13 1518 02/25/13 2140 02/26/13 0505 02/26/13 0940  TROPONINI <0.30 <0.30 1.39* 2.84* 1.92*    Recent Labs Lab 02/27/13 0415  PROBNP 19190.0*     CHEMISTRY  Recent Labs Lab 02/24/13 0410 02/24/13 1130 02/25/13 0445 02/26/13 0505 02/27/13 0415 02/28/13 0505  NA 132*  --  139 138 136 139  K 3.4*  --  4.4 4.6 4.0 3.3*  CL 105  --  115* 114* 112 108  CO2 14*  --  18* 14* 15* 19  GLUCOSE 194*  --  144* 165* 198* 167*  BUN 64*  --  22 17 21  25*  CREATININE 2.28*  --  0.83 0.82 0.92 0.86  CALCIUM 6.6*  --  8.7 8.3* 8.2* 7.6*  MG  --  1.4* 2.1  --  1.7 1.8  PHOS  --  2.7 1.8*  --  2.2* 2.9   Estimated Creatinine Clearance: 68.9 ml/min (by C-G formula based on Cr of  0.86).   LIVER  Recent Labs Lab 02/23/13 1232 02/24/13 0410 02/25/13 0445  AST 46* 34  --   ALT 25 21  --   ALKPHOS 116 77  --   BILITOT 0.1* 0.1*  --   PROT 6.7 5.4*  --   ALBUMIN 3.0* 2.2*  --   INR  --   --  0.99     INFECTIOUS  Recent Labs Lab 02/25/13 0445 02/26/13 1040 02/27/13 0415 02/28/13 0505  LATICACIDVEN 0.7 <0.2* 0.9  --   PROCALCITON 0.24  --  0.81 0.50     ENDOCRINE CBG (last 3)   Recent Labs  02/27/13 1543 02/27/13 1957 02/28/13 0842  GLUCAP 180* 191* 145*         IMAGING x48h  Ct Angio Chest Pe W/cm &/or Wo Cm  02/26/2013   *RADIOLOGY REPORT*  Clinical Data: Short of breath.  Rule out pulmonary embolism. Respiratory distress  CT ANGIOGRAPHY CHEST  Technique:  Multidetector CT imaging of the chest using the standard protocol during bolus administration of intravenous contrast. Multiplanar reconstructed images including MIPs were obtained and reviewed to evaluate the vascular anatomy.  Contrast: OMNIPAQUE IOHEXOL 350 MG/ML SOLN  Comparison: Chest x-ray 02/25/2013  Findings: Image quality limited by motion.  The patient was not able to hold still.  The patient is intubated.  There are bilateral pleural effusions and bibasilar dependent atelectasis.  Small filling defect is present in the pulmonary artery supplying the superior segment of the right lower lobe.  This is best seen on axial thin sections and is seen on multiple images.  No other pulmonary emboli are identified.  Coronary calcification.  Heart size within normal limits.  Mild airspace disease bilaterally consistent with residual pulmonary edema.  No acute bony change.  IMPRESSION: Small segmental pulmonary embolism to the superior segment right lower lobe.  Bilateral effusions and bibasilar atelectasis.  Mild pulmonary edema.  Critical Value/emergent results were called by telephone at the time of interpretation on 02/26/2013 at 1223 hours to Dr. Maple Hudson, who verbally acknowledged  these results.   Original Report Authenticated By: Janeece Riggers, M.D.   Dg Chest Port 1 View  02/28/2013   *RADIOLOGY REPORT*  Clinical Data: Respiratory failure  PORTABLE CHEST - 1 VIEW  Comparison: 02/27/2013  Findings: The cardiac shadow is stable.  An endotracheal tube, left jugular central line and nasogastric catheter are all stable in appearance.  Mild left basilar atelectasis is noted.  Mild central vascular congestion is noted as well.  The overall appearance however is improved in the interval from prior exam.  IMPRESSION: Improved vascular congestion.  Persistent left basilar changes are seen.   Original Report Authenticated By: Alcide Clever, M.D.   Dg Chest Port 1 View  02/27/2013   *RADIOLOGY REPORT*  Clinical Data: 67 year old female with respiratory failure - endotracheal tube retracted.  PORTABLE CHEST - 1 VIEW  Comparison: 02/27/2013  Findings: The endotracheal tube has been slightly retracted with tip now 2.1 cm above the carina - satisfactory. An NG tube entering the stomach and left IJ central venous catheter with tip overlying the medial left brachiocephalic vein again identified. Pulmonary vascular congestion, left upper and lower lobe atelectasis, mild pulmonary edema and probable small effusions again noted. There is no evidence of pneumothorax.  IMPRESSION: Slight interval retraction of endotracheal tube now with tip in satisfactory position.  Continued scattered areas of left lung atelectasis, pulmonary vascular congestion/edema and effusions.   Original Report Authenticated By: Harmon Pier, M.D.   Dg Chest Port 1 View  02/27/2013   *RADIOLOGY REPORT*  Clinical Data: Evaluate endotracheal tube placement.  PORTABLE CHEST - 1 VIEW  Comparison: Chest x-ray 02/25/2013.  Findings: Endotracheal tube is in the low position with tip at the carina directed slightly toward the left mainstem bronchus. There is a left-sided internal jugular central venous catheter with tip terminating in the  proximal superior vena cava. A nasogastric tube is seen extending into the stomach, however, the tip of the nasogastric tube extends below the lower margin of the image.  The lung volumes are low.  Worsening opacity in the left base, favored to reflect left lower lobe atelectasis, although some underlying air space consolidation is not excluded.  Possible small left pleural effusion. There is cephalization of the pulmonary vasculature and slight indistinctness of the interstitial markings suggestive of mild pulmonary edema.  Heart size is borderline large.  Upper mediastinal contours are within normal limits.  IMPRESSION: 1.  Support apparatus, as above.  Low position of endotracheal tube which is at the carina directed toward the left mainstem bronchus. This should be withdrawn 4 cm for more optimal placement. 2.  Interval development of probable left lower lobe atelectasis. 3.  Possible small left pleural effusion.  Mild interstitial pulmonary edema.  Critical Value/emergent results were called by telephone at the time of interpretation on 02/27/2013 at 07:45 a.m. to nurse Marylene Land (for Dr. Marchelle Gearing), who verbally acknowledged these results.   Original Report Authenticated By: Trudie Reed, M.D.       ASSESSMENT / PLAN:  PULMONARY A: VDRF 02/25/13 due to metabolic acidosis//poor resp compensation in setting of NSTEMI. Stridor reported but no abnormalities on intubation. ? Had acute pulm edema v negative pressure edema from VCD (likely latter)   02/27/2013:  Pulmonary embolism confirmed and patient is on IV heparin.  02/28/13: failed sbt   P:   Full vent support Continue IV heparin  CARDIOVASCULAR A: H/O hypertension  Admission 02/23/2013: with shock related to volume loss GI +/- sepsis  02/26/13 - suspect NSTEMI. Cards called  02/27/2013: Diagnosis change from non-STEMI  To pulmonary embolism after d/w cards Dr Graciela Husbands. Hypertensive currently and no pulse pressure variation. Off  pressors  02/28/13: echo  Results back: suggests nstemi with systolic CHF  P:  Continue diuresis cotninue aspirin Continue heparin Start statin Start scheduled lopressor Recall [place] cardiology consult     RENAL A:  Acute renal failure, nonoliguric - resolved Metabolic acidosis - non gap due to diarhhea - persists mild   02/26/13 - metabolic acidosis with poor resp comp at time of intubation.  02/28/13: hypomagnesemia and hypokalemia replaced by elink   P:   Keep MAG > 2 Keep K > 4 Maintain hemodynamics Mionitor lytes   GASTROINTESTINAL A:  H/O IBS Nausea, anorexia Profound diarrhea Heme+ stool (card +, not grossly) - suspect from external irritation   02/24/13 - diarrhea better. C diff neative 02/25/13 - diarrhea resolved. GI canceld sig scope due to chest pain and  ? Due to poor prep 02/27/13 - no diarrhea  P:   Per gi  Dr Randa Evens  tube feeds since 02/27/13  HEMATOLOGIC A:  No issues P:  - PRBC for hgb </= 6.9gm%    - exceptions are   -  if ACS susepcted/confirmed then transfuse for hgb </= 8.0gm%,  or    -  If septic shock first 24h and scvo2 < 70% then transfuse for hgb </= 9.0gm%   - active bleeding with hemodynamic instability, then transfuse regardless of hemoglobin value   At at all times try to transfuse 1 unit prbc as possible with exception of active hemorrhage    INFECTIOUS A:  Suspected gastroenteritis Suspected severe sepsis, not sure   P:   PCT algorithm, if increasing will expand coverage for aspiration pneumonia Po flagyl  X 7 days and Change IV to po cipro x total 7 days; stopp ndate in place    ENDOCRINE A:  Reported history of hypothyroidism. Not on levothyroxine. TSH 3.9 on 02/24/13  02/26/13 - back  on presssors with intubation. But also correlates with coming off hydrocort 02/27/2013: Off pressors after fluid bolus 9/1/114 : still pff pressors  P:   Monitor off synthroid dc hydrocort   NEUROLOGIC A: Chronic pain and  anxiety  02/25/13 - discovered t surreptiously using several CNS drugs at bedside and since then wanting to go AMA.  02/28/13: - sedated with diprivan and fentanyl. Anxious on WUA    P:   Diprivan and fentanyl gtt Scheduled home ambien qhs Monitor lactate and CK while on dirpivan intermittently; next chect 03/01/13  GLOBAL 02/25/13 - d/w patient. She has agreed to say 1-2 days more and if stable go home 02/26/13 or 02/27/13 from floor. She is not sure about staying behind for GI scope but will follow as opd. She wants to eat regular diet. Move to med surg  02/26/13 - pulmonary embolism diagnosed  02/27/2013: Husband, sister in law and brother  updated at bedside MD. CODE STATUS is full code  02/28/13: recall cards consult. Will update husband when rest of family arrive    Dr. Kalman Shan, M.D., [facility].C.P Pulmonary and Critical Care Medicine Staff Physician [facility] Pager: [phone], If no answer or between  15:00h - 7:00h: call [phone]  02/28/2013 10:46 AM

## 2013-02-28 NOTE — Progress Notes (Signed)
NUTRITION FOLLOW UP and Consult Consult for TF management 8/30  Intervention:   Change TF regimen to Vital AF 1.2 @10  ml/hr with 30 ml Pro-stat 5 times daily. This will provide 788 kcal, 93 grams of protein, and 195 ml of free water.  TF plus propofol will provide 1342 kcal, 93 grams of protein, and 195 ml of water which will meet 80% of estimated kcal needs and 93% of estimated protein needs.  Add liquid multivitamin daily per tube.  Nutrition Dx:   Inadequate oral intake related to inability to eat AEB npo status; ongoing  Goal:   Enteral nutrition to provide 60-70% of estimated calorie needs (22-25 kcals/kg ideal body weight) and 100% of estimated protein needs, based on ASPEN guidelines for permissive underfeeding in critically ill obese individuals; NOT MET- exceeding goal   Monitor:   TF tolerance and adequacy; overfed, tolerating Labs;  Weight; 13 lbs above admission wt  Assessment:   Full nutrition assessment 8/28.  Initiation of TF per TF protocol 8/30. Admitted with diarrhea now resolved, Patient now intubated s/p respiratory arrest, found to have PE.  On diprivan currently at 21 mg per hour which is providing:  554 kcals daily.  Patient has OGT in place. Vital AF 1.2 is infusing @ 35 ml/hr. 30 ml Prostat via tube BID. Tube feeding regimen currently providing 1208 kcal, 93 grams protein, and 681 ml H2O.  TF plus propofol provide 1762 kcal, 93 grams of protein, and 681 ml of free water.  Current TF regiment meets 105% of estimated energy needs and 93% of estimated protein needs.  Residuals: 5 ml  Last bm: 9/1   Height: Ht Readings from Last 1 Encounters:  02/25/13 5\' 2"  (1.575 m)    Weight Status:   Wt Readings from Last 1 Encounters:  02/28/13 203 lb 0.7 oz (92.1 kg)   Patient is currently intubated on ventilator support.  MV: 11.4 L/min Temp:Temp (24hrs), Avg:98.7 F (37.1 C), Min:97.9 F (36.6 C), Max:99.1 F (37.3 C)  Propofol: 21 ml/hr (provides 554 kcal  per 24 hours)  Re-estimated needs:  Kcal: 1682 (Goal: 4098-1191) Protein: >/=100 gm Fluid: >1.8 L  Skin: red peri area of buttocks, intact  Diet Order: NPO   Intake/Output Summary (Last 24 hours) at 02/28/13 1447 Last data filed at 02/28/13 1302  Gross per 24 hour  Intake 2262.51 ml  Output   5225 ml  Net -2962.49 ml    Last BM: 9/1   Labs:   Recent Labs Lab 02/25/13 0445 02/26/13 0505 02/27/13 0415 02/28/13 0505  NA 139 138 136 139  K 4.4 4.6 4.0 3.3*  CL 115* 114* 112 108  CO2 18* 14* 15* 19  BUN 22 17 21  25*  CREATININE 0.83 0.82 0.92 0.86  CALCIUM 8.7 8.3* 8.2* 7.6*  MG 2.1  --  1.7 1.8  PHOS 1.8*  --  2.2* 2.9  GLUCOSE 144* 165* 198* 167*    CBG (last 3)   Recent Labs  02/27/13 1957 02/28/13 0842 02/28/13 1209  GLUCAP 191* 145* 173*    Scheduled Meds: . albumin human  12.5 g Intravenous Once  . antiseptic oral rinse  15 mL Mouth Rinse BID  . antiseptic oral rinse  15 mL Mouth Rinse QID  . aspirin  81 mg Per Tube Daily  . atorvastatin  10 mg Oral q1800  . chlorhexidine  15 mL Mouth Rinse BID  . ciprofloxacin  500 mg Oral BID  . feeding supplement  30 mL  Oral BID  . furosemide  40 mg Intravenous Q8H  . metoprolol  3 mg Intravenous Q3H  . metroNIDAZOLE  500 mg Oral Q8H  . multivitamin  5 mL Per Tube Daily  . potassium chloride  40 mEq Per Tube BID  . Racepinephrine HCl  0.5 mL Nebulization Once  . saccharomyces boulardii  250 mg Oral BID  . sodium phosphate  Dextrose 5% IVPB  30 mmol Intravenous Once    Continuous Infusions: . feeding supplement (VITAL AF 1.2 CAL) 1,000 mL (02/27/13 1655)  . fentaNYL infusion INTRAVENOUS 200 mcg/hr (02/28/13 1300)  . heparin 900 Units/hr (02/28/13 1303)  . propofol 50 mcg/kg/min (02/28/13 1300)    Ian Malkin RD, LDN Inpatient Clinical Dietitian Pager: [phone] After Hours Pager: [phone]

## 2013-02-28 NOTE — Consult Note (Signed)
The patient was seen and examined, and I agree with the assessment and plan as documented above. Patient has no prior h/o CAD nor family h/o premature CAD. Currently being treated for NSTEMI, small subsegmental pulmonary embolism, and acute gastroenteritis. There does not appear to be a h/o clotting disorders in the family, as per family members in the room at the time of my evaluation. She is currently intubated, but did nod her head when asked about current and prior episodes of chest pain. Troponins peaked at 2.84 and now trending down. Echocardiogram highly suspicious for ischemic heart disease. Continue current medical therapy for now (ASA, beta blocker, heparin, statin), and plan for cardiac catheterization in the future, once she has stabilized from a respiratory standpoint.

## 2013-02-28 NOTE — Progress Notes (Signed)
RN discontinued A-line per MD order.

## 2013-02-28 NOTE — Progress Notes (Signed)
[id number]/Rhonda Davis, RN, BSN, CCM [phone] Chart Reviewed for discharge and hospital needs. Discharge needs at time of review:  None Review of patient progress due on [id number]. 

## 2013-02-28 NOTE — Progress Notes (Addendum)
ANTICOAGULATION CONSULT NOTE - Follow-up Consult  Pharmacy Consult for Heparin Indication: PE  No Known Allergies  Patient Measurements: Height: 5\' 2"  (157.5 cm) Weight: 203 lb 0.7 oz (92.1 kg) IBW/kg (Calculated) : 50.1 Heparin Dosing Weight: 70 kg  Vital Signs: Temp: 98.8 F (37.1 C) (09/01 0400) Temp src: Axillary (09/01 0400) BP: 126/78 mmHg (09/01 0747) Pulse Rate: 95 (09/01 0747)  Labs:  Recent Labs  02/25/13 1050  02/25/13 2140  02/26/13 0505 02/26/13 0940  02/27/13 0415 02/27/13 1400 02/28/13 0505  HGB  --   --   --   < > 10.3*  --   --  9.2*  --  9.2*  HCT  --   --   --   --  31.0*  --   --  28.2*  --  28.2*  PLT  --   --   --   --  278  --   --  257  --  267  HEPARINUNFRC  --   --   --   --   --   --   < > 0.73* 0.37 0.31  CREATININE  --   --   --   --  0.82  --   --  0.92  --  0.86  CKTOTAL 401*  --   --   --   --   --   --   --   --   --   CKMB 9.6*  --   --   --   --   --   --   --   --   --   TROPONINI <0.30  < > 1.39*  --  2.84* 1.92*  --   --   --   --   < > = values in this interval not displayed.  Estimated Creatinine Clearance: 68.9 ml/min (by C-G formula based on Cr of 0.86).   Medical History: Past Medical History  Diagnosis Date  . IBS (irritable bowel syndrome)   . Diverticulitis   . Hypertension   . Fibromyalgia     Medications:  Scheduled:  . albumin human  12.5 g Intravenous Once  . antiseptic oral rinse  15 mL Mouth Rinse BID  . antiseptic oral rinse  15 mL Mouth Rinse QID  . aspirin  81 mg Per Tube Daily  . chlorhexidine  15 mL Mouth Rinse BID  . ciprofloxacin  500 mg Oral BID  . feeding supplement  30 mL Oral BID  . furosemide  40 mg Intravenous BID  . hydrocortisone sod succinate (SOLU-CORTEF) inj  50 mg Intravenous Q8H  . metroNIDAZOLE  500 mg Oral Q8H  . multivitamin  5 mL Per Tube Daily  . potassium chloride  20 mEq Per Tube Q4H  . Racepinephrine HCl  0.5 mL Nebulization Once  . saccharomyces boulardii  250 mg Oral  BID  . sodium phosphate  Dextrose 5% IVPB  30 mmol Intravenous Once   Infusions:  . feeding supplement (VITAL AF 1.2 CAL) 1,000 mL (02/27/13 1655)  . fentaNYL infusion INTRAVENOUS 200 mcg/hr (02/28/13 0100)  . heparin 850 Units/hr (02/27/13 1900)  . phenylephrine (NEO-SYNEPHRINE) Adult infusion Stopped (02/26/13 1600)  . propofol 50 mcg/kg/min (02/28/13 0800)    Assessment: 67 yr female admitted 8/27 with persistent hypotension, suspected gastroenteritis, acute renal failure and hypovolemic shock.  8/29 pt developed atypical chest pain with elevated troponins and respiratory distress requiring intubation.  RLL PE also noted on PE.  IV heparin started  8/30.     9/1 HL therapeutic @ 0.31 (goal 0.3-0.7), though near low end of goal.  Concerned that concentrations may continue to fall. Will f/u closely with another 6 hour level.    Small decrease in hgb, plts ok.  No issues per RN noted.   SCr WNL, CrCl ~46ml/min.  Goal of Therapy:  Heparin level 0.3-0.7 units/ml Monitor platelets by anticoagulation protocol: Yes   Plan:   Continue heparin rate at 850 units/hr (=8.5 ml/hr)  Check heparin level in 6 hrs  Check heparin level and CBC daily while on heparin  Haynes Hoehn, PharmD 02/28/2013, 8:26 AM  Pager: [phone]  Addendum: 6 hour heparin level = 0.30.  Will increase heparin infusion to 900 units/hr (9 ml/hr).  No issues per RN.  Recheck in 6 hours.   Haynes Hoehn, PharmD 02/28/2013, 1:02 PM  Pager: [phone]

## 2013-03-01 ENCOUNTER — Inpatient Hospital Stay (HOSPITAL_COMMUNITY): Payer: Medicare Other

## 2013-03-01 DIAGNOSIS — I2699 Other pulmonary embolism without acute cor pulmonale: Secondary | ICD-10-CM

## 2013-03-01 LAB — BASIC METABOLIC PANEL
BUN: 29 mg/dL — ABNORMAL HIGH (ref 6–23)
CO2: 24 mEq/L (ref 19–32)
Chloride: 108 mEq/L (ref 96–112)
Chloride: 107 mEq/L (ref 96–112)
GFR calc Af Amer: 83 mL/min — ABNORMAL LOW (ref >90)
GFR calc Af Amer: >90 mL/min (ref >90)
GFR calc non Af Amer: 71 mL/min — ABNORMAL LOW (ref >90)
Potassium: 2.8 mEq/L — ABNORMAL LOW (ref 3.5–5.1)
Potassium: 4.3 mEq/L (ref 3.5–5.1)
Sodium: 143 mEq/L (ref 135–145)

## 2013-03-01 LAB — GLUCOSE, CAPILLARY
Glucose-Capillary: 107 mg/dL — ABNORMAL HIGH (ref 70–99)
Glucose-Capillary: 168 mg/dL — ABNORMAL HIGH (ref 70–99)
Glucose-Capillary: 176 mg/dL — ABNORMAL HIGH (ref 70–99)

## 2013-03-01 LAB — CBC WITH DIFFERENTIAL/PLATELET
Basophils Absolute: 0 10*3/uL (ref 0.0–0.1)
Eosinophils Relative: 1 % (ref 0–5)
HCT: 29.5 % — ABNORMAL LOW (ref 36.0–46.0)
Hemoglobin: 9.6 g/dL — ABNORMAL LOW (ref 12.0–15.0)
Lymphs Abs: 3.4 10*3/uL (ref 0.7–4.0)
MCHC: 32.5 g/dL (ref 30.0–36.0)
Monocytes Absolute: 1.2 10*3/uL — ABNORMAL HIGH (ref 0.1–1.0)
RBC: 3.48 MIL/uL — ABNORMAL LOW (ref 3.87–5.11)
WBC: 17.1 10*3/uL — ABNORMAL HIGH (ref 4.0–10.5)

## 2013-03-01 LAB — GI PATHOGEN PANEL BY PCR, STOOL
C difficile toxin A/B: NEGATIVE
Campylobacter by PCR: NEGATIVE
Cryptosporidium by PCR: NEGATIVE
E coli (ETEC) LT/ST: NEGATIVE
E coli (STEC): NEGATIVE
Norovirus GI/GII: NEGATIVE
Rotavirus A by PCR: NEGATIVE

## 2013-03-01 LAB — CULTURE, BLOOD (ROUTINE X 2)
Culture: NO GROWTH
Culture: NO GROWTH

## 2013-03-01 LAB — HEPARIN LEVEL (UNFRACTIONATED): Heparin Unfractionated: 0.52 IU/mL (ref 0.30–0.70)

## 2013-03-01 LAB — LACTIC ACID, PLASMA: Lactic Acid, Venous: 1.5 mmol/L (ref 0.5–2.2)

## 2013-03-01 LAB — CK: Total CK: 210 U/L — ABNORMAL HIGH (ref 7–177)

## 2013-03-01 LAB — PROCALCITONIN: Procalcitonin: 0.27 ng/mL

## 2013-03-01 LAB — TROPONIN I: Troponin I: <0.3 ng/mL (ref <0.30)

## 2013-03-01 MED ORDER — MAGNESIUM SULFATE IN D5W 10-5 MG/ML-% IV SOLN
1.0000 g | Freq: Once | INTRAVENOUS | Status: AC
  Administered 2013-03-01: 1 g via INTRAVENOUS
  Filled 2013-03-01: qty 100

## 2013-03-01 MED ORDER — GLUCERNA 1.2 CAL PO LIQD
711.0000 mL | ORAL | Status: DC
  Filled 2013-03-01: qty 711

## 2013-03-01 MED ORDER — POTASSIUM CHLORIDE 10 MEQ/50ML IV SOLN
10.0000 meq | INTRAVENOUS | Status: AC
  Administered 2013-03-01 (×6): 10 meq via INTRAVENOUS
  Filled 2013-03-01: qty 50
  Filled 2013-03-01: qty 200
  Filled 2013-03-01: qty 50

## 2013-03-01 MED ORDER — FUROSEMIDE 10 MG/ML IJ SOLN
40.0000 mg | Freq: Two times a day (BID) | INTRAMUSCULAR | Status: DC
  Administered 2013-03-01 – 2013-03-02 (×3): 40 mg via INTRAVENOUS
  Filled 2013-03-01 (×6): qty 4

## 2013-03-01 MED ORDER — METOPROLOL TARTRATE 1 MG/ML IV SOLN
5.0000 mg | Freq: Four times a day (QID) | INTRAVENOUS | Status: DC
  Administered 2013-03-01 – 2013-03-02 (×4): 5 mg via INTRAVENOUS
  Filled 2013-03-01 (×8): qty 5

## 2013-03-01 MED ORDER — PRO-STAT SUGAR FREE PO LIQD
30.0000 mL | Freq: Three times a day (TID) | ORAL | Status: DC
  Administered 2013-03-01 – 2013-03-02 (×4): 30 mL
  Filled 2013-03-01 (×8): qty 30

## 2013-03-01 MED ORDER — VITAL AF 1.2 CAL PO LIQD
711.0000 mL | ORAL | Status: DC
  Filled 2013-03-01 (×5): qty 711

## 2013-03-01 MED ORDER — VITAL AF 1.2 CAL PO LIQD
1000.0000 mL | ORAL | Status: DC
  Filled 2013-03-01: qty 1000

## 2013-03-01 MED ORDER — VITAL AF 1.2 CAL PO LIQD
711.0000 mL | ORAL | Status: DC
  Filled 2013-03-01: qty 711

## 2013-03-01 MED ORDER — VITAL AF 1.2 CAL PO LIQD
720.0000 mL | Freq: Once | ORAL | Status: DC
  Filled 2013-03-01: qty 1000

## 2013-03-01 MED ORDER — MAGNESIUM SULFATE BOLUS VIA INFUSION: 1.0000 g | Freq: Once | INTRAVENOUS | Status: DC

## 2013-03-01 NOTE — Progress Notes (Signed)
[facility] ADULT ICU REPLACEMENT PROTOCOL FOR AM LAB REPLACEMENT ONLY  The patient does not apply for the [facility] Adult ICU Electrolyte Replacment Protocol based on the criteria listed below:   1. Is GFR >/= 40 ml/min? yes  Patient's GFR today is  2. Is urine output >/= 0.5 ml/kg/hr for the last 6 hours? no Patient's UOP is NO UOP RECORDED ml/kg/hr 3. Is BUN < 60 mg/dL? yes  Patient's BUN today is  4. Abnormal electrolyte(s):K 2.8, mag 1.6 5. Ordered repletion with: NA 6. If a panic level lab has been reported, has the CCM MD in charge been notified? yes.   Physician:  Dr Arnell Asal, Anayansi Rundquist A 03/01/2013 6:02 AM\

## 2013-03-01 NOTE — Progress Notes (Addendum)
    Subjective:  Pt intubated. Eyes open but not responding to me today. Nurse tells me she has been following commands this am but has periods where she does not follow.  Objective:  Vital Signs in the last 24 hours: Temp:  [98 F (36.7 C)-99.1 F (37.3 C)] 98 F (36.7 C) (09/02 0400) Pulse Rate:  [78-130] 100 (09/02 0630) Resp:  [22] 22 (09/02 0630) BP: (101-152)/(50-90) 105/51 mmHg (09/02 0630) SpO2:  [85 %-100 %] 100 % (09/02 0630) FiO2 (%):  [30 %] 30 % (09/02 0336) Weight:  [193 lb 5.5 oz (87.7 kg)] 193 lb 5.5 oz (87.7 kg) (09/02 0500)  Intake/Output from previous day: 09/01 0701 - 09/02 0700 In: 1766.9 [I.V.:1161.9; NG/GT:455; IV Piggyback:150] Out: 5350 [Urine:5175; Stool:175]  Physical Exam: Pt is awake, not responding to questions. HEENT: normal Neck: JVP - normal Lungs: CTA bilaterally CV: tachy with summation gallop present Abd: soft, NT, Positive BS Ext: no C/C/E, distal pulses intact and equal, feet warm and appear well-perfused Skin: warm/dry no rash  Lab Results:  Recent Labs  02/28/13 0505 03/01/13 0500  WBC 15.1* 17.1*  HGB 9.2* 9.6*  PLT 267 303    Recent Labs  02/28/13 0505 03/01/13 0500  NA 139 143  K 3.3* 2.8*  CL 108 108  CO2 19 24  GLUCOSE 167* 142*  BUN 25* 29*  CREATININE 0.86 0.84    Recent Labs  02/26/13 0940 03/01/13 0500  TROPONINI 1.92* <0.30   Cardiac Studies: 2D Echo: Study Conclusions  - Left ventricle: Wall motion abnormailty suggests ischemic heart disease and or Stress Cardiomyopathy The cavity size was normal. There is severe hypokinesis of the mid-distalanteroseptal and anterior myocardium; consistent with infarction. There is akinesis of the mid-distallateral, inferolateral, inferior, and inferoseptal myocardium; consistent with infarction. There is moderate hypokinesis of the apical myocardium. Doppler parameters are consistent with a reversible restrictive pattern, indicative of decreased left  ventricular diastolic compliance and/or increased left atrial pressure (grade 3 diastolic dysfunction). Doppler parameters are consistent with high ventricular filling pressure. - Aortic valve: Valve area: 2.34cm^2(VTI). Valve area: 2.55cm^2 (Vmax). - Mitral valve: Mild regurgitation. - Left atrium: The atrium was mildly dilated. - Pericardium, extracardiac: There was a left pleural effusion.  Tele: Sinus rhythm, sinus tach, rare single PVC, no significant arrhythmia  Assessment/Plan:  1. Hypovolemic shock, improved with volume resuscitation and pressors 2. NSTEMI, low troponin peak now with normal troponin this am 3. Severe LV dysfunction with periapical akinesis 4. Hypokalemia 5. Altered mental status, suspect secondary to critical illness and medical problems above  Diff dx for NSTEMI includes ACS (plaque rupture) versus demand ischemia versus stress cardiomyopathy (Takotsubo's). I think based on modest troponin levels and severe LV dysfunction with pattern of periapical akinesis that stress cardiomyopathy is likely. Regardless, would treat as ACS and plan on cardiac cath as she stabilizes and is extubated. On appropriate Rx with beta blocker, heparin, statin, and ASA. Not a candidate for ACE/ARB because of borderline BP. Would back off a bit on diuretics as she is approximately negative 4L over last 48 hours and about even for entire hospitalization. Change metoprolol to 5 mg Q 6 hours as tolerated. Can decrease to 2.5 mg if BP does not allow for higher dose. Will follow with you, thx.   Tonny Bollman, M.D. 03/01/2013, 7:53 AM

## 2013-03-01 NOTE — Progress Notes (Signed)
Stopped to see patient today. She is intubated and was able to nod her head, affirmatively or negatively. I offered a silent prayer for her as she indicated she did not want me to pray. She seemed appreciative of my visit. Family was not present in the room.

## 2013-03-01 NOTE — Progress Notes (Addendum)
*  Preliminary Results* Bilateral lower extremity venous duplex completed. Study was technically limited due to patient's inability to cooperate and poor sound wave transmission. Visualized veins of bilateral lower extremities are negative for deep vein thrombosis. There is no evidence of Baker's cyst bilaterally. Bilateral saphenofemoral junctions and common femoral veins were difficult to compress, this is suggestive of elevated venous pressure and/or iliac/IVC obstruction. Bilateral lower extremity venous signals were pulsatile, suggestive of fluid overload/ congestive heart failure.  03/01/2013  Gertie Fey, RVT, RDCS, RDMS

## 2013-03-01 NOTE — Progress Notes (Signed)
PULMONARY  / CRITICAL CARE MEDICINE  Name: JUDA LAJEUNESSE MRN: [id number] DOB: 10-12-1945    ADMISSION DATE:  02/23/2013  REFERRING MD :  EDP PRIMARY SERVICE: PCCM  REASON FOR ADMISSION: persistent hypotension after volume resuscitation  BRIEF PATIENT DESCRIPTION:  67 yo female presented with several weeks of N/V, headache, anorexia, weakness and diarrhea.  Found to be hypotensive in ED, and PCCM asked to admit with shock (hypovolemic vs septic), hyponatremia, and renal failure.  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  SIGNIFICANT EVENTS: 8/27 Admit ICU with shock on pressors, GI consulted 8/29 c/o chest pain, Lt arm pain >> troponin elevated, pulmonary edema with VDRF, sigmoidoscopy cancelled 8/30 CT chest with PE >> start heparin gtt 9/01 Cardiology consulted  STUDIES: 8/27 CT abd/pelvis >> dilated gallbladder, no wall thickening or pericholecystic fluid, no gallstones 8/28 Abd u/s >> dilated gallbladder, chronic CBD dilation 8/30 Echo >> severe hypokinesis of mid-distalanteroseptal and anterior myocardium; akinesis of mid-distallateral, inferolateral, inferior, and inferoseptal myocardium; grade 3 diastolic dysfx; mild MR 8/30 CT chest >> b/l pleural effusions, basilar ATX, small PE superior segment RLL, coronary calcification  LINES / TUBES: L IJ CVL 8/26 >>  ETT 8/29>> Aline 8/29>>9/1  CULTURES: MRSA PCR 02/22/2013 is negative C diff 8/26 >>  NEG final Stool 8/26 >>  Urine 8/26 >> negative final Blood 8/26 >> negative as of 02/27/2013  ANTIBIOTICS: Vancomycin and Zosyn 08/27 >> 08/27 Ciprofloxacin 8/26 >>  (7 days stop date is 9/02) Metronidazole (PO) 8/26 >>  (7 days stop date is 09/03)  SUBJECTIVE: Denies chest pain, abdominal pain.  Tolerating some pressure support.  VITAL SIGNS: Temp:  [98 F (36.7 C)-98.9 F (37.2 C)] 98.8 F (37.1 C) (09/02 0800) Pulse Rate:  [78-130] 116 (09/02 0800) Resp:  [22] 22 (09/02 0800) BP:  (101-152)/(50-90) 114/63 mmHg (09/02 0800) SpO2:  [85 %-100 %] 100 % (09/02 0800) FiO2 (%):  [30 %] 30 % (09/02 0800) Weight:  [193 lb 5.5 oz (87.7 kg)] 193 lb 5.5 oz (87.7 kg) (09/02 0500) HEMODYNAMICS: CVP:  [15 mmHg] 15 mmHg VENTILATOR SETTINGS: Vent Mode:  [-] PRVC FiO2 (%):  [30 %] 30 % Set Rate:  [22 bmp] 22 bmp Vt Set:  [500 mL] 500 mL PEEP:  [5 cmH20] 5 cmH20 Pressure Support:  [5 cmH20] 5 cmH20 Plateau Pressure:  [17 cmH20-23 cmH20] 17 cmH20 INTAKE / OUTPUT: Intake/Output     09/01 0701 - 09/02 0700 09/02 0701 - 09/03 0700   I.V. (mL/kg) 1161.9 (13.2) 10.5 (0.1)   NG/GT 455 10   IV Piggyback 150 50   Total Intake(mL/kg) 1766.9 (20.1) 70.5 (0.8)   Urine (mL/kg/hr) 5175 (2.5) 1700 (9.6)   Stool 175 (0.1)    Total Output 5350 1700   Net -3583.1 -1629.5        Stool Occurrence 2 x      PHYSICAL EXAMINATION: General: Anxious Neuro: Tremulous, follows commands, normal strength HEENT: ETT, OG in place Cardiovascular: regular, tachycardic, no murmur Lungs: decreased breath sounds at bases, no wheeze Abdomen: soft, non tender, + bowel sounds Ext: no edema Skin: no rashes  LABS: PULMONARY  Recent Labs Lab 02/26/13 0100 02/26/13 0414 02/26/13 0940 02/26/13 0947  PHART 7.223* 7.410  --  7.400  PCO2ART 38.5 23.6*  --  24.5*  PO2ART 437.0* 242.0*  --  166.0*  HCO3 15.3* 14.7*  --  14.9*  TCO2 14.5 13.5  --  13.7  O2SAT 97.7 97.8 62.1 97.3  CBC  Recent Labs Lab 02/27/13 0415 02/28/13 0505 03/01/13 0500  HGB 9.2* 9.2* 9.6*  HCT 28.2* 28.2* 29.5*  WBC 11.5* 15.1* 17.1*  PLT 257 267 303    COAGULATION  Recent Labs Lab 02/25/13 0445  INR 0.99    CARDIAC    Recent Labs Lab 02/25/13 1518 02/25/13 2140 02/26/13 0505 02/26/13 0940 03/01/13 0500  TROPONINI <0.30 1.39* 2.84* 1.92* <0.30    Recent Labs Lab 02/27/13 0415 03/01/13 0500  PROBNP 19190.0* 24242.0*     CHEMISTRY  Recent Labs Lab 02/24/13 1130 02/25/13 0445  02/26/13 0505 02/27/13 0415 02/28/13 0505 03/01/13 0500  NA  --  139 138 136 139 143  K  --  4.4 4.6 4.0 3.3* 2.8*  CL  --  115* 114* 112 108 108  CO2  --  18* 14* 15* 19 24  GLUCOSE  --  144* 165* 198* 167* 142*  BUN  --  22 17 21  25* 29*  CREATININE  --  0.83 0.82 0.92 0.86 0.84  CALCIUM  --  8.7 8.3* 8.2* 7.6* 8.3*  MG 1.4* 2.1  --  1.7 1.8 1.6  PHOS 2.7 1.8*  --  2.2* 2.9 2.8   Estimated Creatinine Clearance: 68.6 ml/min (by C-G formula based on Cr of 0.84).   LIVER  Recent Labs Lab 02/23/13 1232 02/24/13 0410 02/25/13 0445  AST 46* 34  --   ALT 25 21  --   ALKPHOS 116 77  --   BILITOT 0.1* 0.1*  --   PROT 6.7 5.4*  --   ALBUMIN 3.0* 2.2*  --   INR  --   --  0.99     INFECTIOUS  Recent Labs Lab 02/26/13 1040 02/27/13 0415 02/28/13 0505 03/01/13 0500  LATICACIDVEN <0.2* 0.9  --  1.5  PROCALCITON  --  0.81 0.50 0.27     ENDOCRINE CBG (last 3)   Recent Labs  03/01/13 0002 03/01/13 0400 03/01/13 0731  GLUCAP 107* 143* 125*    IMAGING x48h  Dg Chest Port 1 View  03/01/2013   *RADIOLOGY REPORT*  Clinical Data: Check ETT  PORTABLE CHEST - 1 VIEW  Comparison: 02/28/2013  Findings: Left basilar opacity, likely a combination of atelectasis and small pleural effusion.  Mild pulmonary vascular congestion without frank interstitial edema.  Mild cardiomegaly.  Endotracheal tube terminates 4.5 cm above the carina.  Enteric tube courses below the diaphragm.  Left IJ venous catheter terminates in the left brachiocephalic vein.  IMPRESSION: Endotracheal tube terminates 4.5 cm above the carina.  Left basilar opacity, likely a combination of atelectasis and small pleural effusion.  Additional support apparatus as above.   Original Report Authenticated By: Charline Bills, M.D.   Dg Chest Port 1 View  02/28/2013   *RADIOLOGY REPORT*  Clinical Data: Respiratory failure  PORTABLE CHEST - 1 VIEW  Comparison: 02/27/2013  Findings: The cardiac shadow is stable.  An  endotracheal tube, left jugular central line and nasogastric catheter are all stable in appearance.  Mild left basilar atelectasis is noted.  Mild central vascular congestion is noted as well.  The overall appearance however is improved in the interval from prior exam.  IMPRESSION: Improved vascular congestion.  Persistent left basilar changes are seen.   Original Report Authenticated By: Alcide Clever, M.D.   Dg Chest Port 1 View  02/27/2013   *RADIOLOGY REPORT*  Clinical Data: 67 year old female with respiratory failure - endotracheal tube retracted.  PORTABLE CHEST - 1 VIEW  Comparison: 02/27/2013  Findings: The endotracheal tube has been slightly retracted with tip now 2.1 cm above the carina - satisfactory. An NG tube entering the stomach and left IJ central venous catheter with tip overlying the medial left brachiocephalic vein again identified. Pulmonary vascular congestion, left upper and lower lobe atelectasis, mild pulmonary edema and probable small effusions again noted. There is no evidence of pneumothorax.  IMPRESSION: Slight interval retraction of endotracheal tube now with tip in satisfactory position.  Continued scattered areas of left lung atelectasis, pulmonary vascular congestion/edema and effusions.   Original Report Authenticated By: Harmon Pier, M.D.      ASSESSMENT / PLAN:  PULMONARY A: Acute respiratory failure 2nd to acute pulmonary edema. Acute PE >> very small; unsure this is cause of her respiratory distress. ?sleep disordered breathing. P:   -pressure support wean as tolerated >> not sure if she is ready for extubation yet -goal negative fluid balance -continue heparin gtt -check doppler legs -likely will need CPAP/BiPAP after extubation >> outpt assessment for sleep apnea  CARDIOVASCULAR A: Shock on admission >> ? Hypovolemic +/- sepsis.  Off pressors. Acute systolic heart failure >> ? Coronary ischemia with NSTEMI vs stress cardiomyopathy. Acute on chronic  diastolic heart failure. Hx of hypertension, hyperlipidemia. P:  -continue ASA, lipitor, lopressor -defer ACE inhibitor to cardiology  -plan for cardiac cath when extubated  RENAL A: Acute renal failure 2nd to hypovolemia, shock >> resolved. Non anion gap acidosis 2nd to diarrhea and renal failure >> resolved. Acute renal failure, nonoliguric - resolved Hypokalemia. Hypomagnesemia. P:   -monitor renal fx, urine outpt -f/u and replace electrolytes as needed  GASTROINTESTINAL A:  N/V and diarrhea in setting of IBS >> diarrhea improved. Heme + stools. Nutrition. P:   -defer sigmoidoscopy until more stable -tube feeds while on vent -protonix for SUP  HEMATOLOGIC A: Anemia. P:  -f/u CBC -transfuse for Hb < 8 with concern for ACS  INFECTIOUS A: Gastroenteritis P:   -completed cipro -last day of flagyl  ENDOCRINE A: Reported hx of hypothyroidism >> TSH 3.9 from 8/28. Relative adrenal insufficiency >> weaned off solu cortef. Hyperglycemia. P:   -SSI  NEUROLOGIC A: Chronic pain, anxiety, fibromyalgia. P:   -limit sedation while weaning vent  Code status >> full code  CC time 40 minutes.  Coralyn Helling, MD [facility] Pulmonary/Critical Care 03/01/2013, 9:28 AM Pager:  [phone] After 3pm call: [phone]

## 2013-03-01 NOTE — Progress Notes (Addendum)
ANTICOAGULATION CONSULT NOTE - Follow-up Consult  Pharmacy Consult for Heparin Indication: PE  No Known Allergies  Patient Measurements: Height: 5\' 2"  (157.5 cm) Weight: 193 lb 5.5 oz (87.7 kg) IBW/kg (Calculated) : 50.1 Heparin Dosing Weight: 70 kg  Vital Signs: Temp: 98 F (36.7 C) (09/02 0400) Temp src: Oral (09/02 0400) BP: 105/51 mmHg (09/02 0630) Pulse Rate: 100 (09/02 0630)  Labs:  Recent Labs  02/26/13 0940  02/27/13 0415  02/28/13 0505 02/28/13 1125 02/28/13 1915 03/01/13 0400 03/01/13 0500  HGB  --   < > 9.2*  --  9.2*  --   --   --  9.6*  HCT  --   --  28.2*  --  28.2*  --   --   --  29.5*  PLT  --   --  257  --  267  --   --   --  303  HEPARINUNFRC  --   < > 0.73*  < > 0.31 0.30 0.17* 0.46  --   CREATININE  --   --  0.92  --  0.86  --   --   --  0.84  CKTOTAL  --   --   --   --   --   --   --   --  210*  TROPONINI 1.92*  --   --   --   --   --   --   --  <0.30  < > = values in this interval not displayed.  Estimated Creatinine Clearance: 68.6 ml/min (by C-G formula based on Cr of 0.84).   Medical History: Past Medical History  Diagnosis Date  . IBS (irritable bowel syndrome)   . Diverticulitis   . Hypertension   . Fibromyalgia   . Systolic dysfunction, left ventricle     a. 01/2013 Echo: Sev HK of mid-dist antsept, ant, apical walls; mid-dist lat/inflat/inf/infsept AK, Gr 3 DD, Mild MR.  . NSTEMI (non-ST elevated myocardial infarction)     a. 01/2013  . PE (pulmonary embolism)     a. 01/2013 CTA Chest: small segmental PE to the superior segment of RLL.    Medications:  Scheduled:  . albumin human  12.5 g Intravenous Once  . antiseptic oral rinse  15 mL Mouth Rinse BID  . antiseptic oral rinse  15 mL Mouth Rinse QID  . aspirin  81 mg Per Tube Daily  . atorvastatin  10 mg Oral q1800  . chlorhexidine  15 mL Mouth Rinse BID  . feeding supplement  30 mL Per Tube Q2000  . feeding supplement  60 mL Per Tube BID  . furosemide  40 mg Intravenous Q8H   . magnesium sulfate 1 - 4 g bolus IVPB  1 g Intravenous Once  . metoprolol  3 mg Intravenous Q3H  . metroNIDAZOLE  500 mg Oral Q8H  . multivitamin  5 mL Per Tube Daily  . pantoprazole (PROTONIX) IV  40 mg Intravenous Q24H  . potassium chloride  10 mEq Intravenous Q1 Hr x 6  . potassium chloride  40 mEq Per Tube BID  . Racepinephrine HCl  0.5 mL Nebulization Once  . saccharomyces boulardii  250 mg Oral BID  . sodium phosphate  Dextrose 5% IVPB  30 mmol Intravenous Once   Infusions:  . feeding supplement (VITAL AF 1.2 CAL) 1,000 mL (02/28/13 1724)  . fentaNYL infusion INTRAVENOUS 200 mcg/hr (03/01/13 0406)  . heparin 1,050 Units/hr (02/28/13 2130)  . propofol 40 mcg/kg/min (03/01/13  73)    Assessment: 67 yr female admitted 8/27 with persistent hypotension, suspected gastroenteritis, acute renal failure and hypovolemic shock.  8/29 pt developed atypical chest pain with elevated troponins and respiratory distress requiring intubation.  RLL PE also noted on PE.  IV heparin started 8/30.   Cardiac catheterization planned once patient stable and prior to placing on long term oral anticoagulant.  9/2 HL therapeutic @ 0.46 (goal 0.3-0.7) with infusion running at 1050 units/hr.  Hgb low but stable, platelets WNL  SCr WNL, CrCl ~24ml/min. No bleeding/complications reported.  Goal of Therapy:  Heparin level 0.3-0.7 units/ml Monitor platelets by anticoagulation protocol: Yes   Plan:   Continue heparin rate at 1050 units/hr (=10.5 ml/hr)  Check heparin level in 6 hrs to confirm rate.  Check heparin level and CBC daily while on heparin  Clance Boll, PharmD, BCPS Pager: [phone] 03/01/2013 7:56 AM   Addendum: 03/01/2013 11:58 AM Heparin level remains therapeutic at 0.52 with infusion at 1050 units/hr. Plan: continue heparin at 1050 units/hr.  Check CBC and heparin level tomorrow AM.  Clance Boll, PharmD, BCPS Pager: [phone] 03/01/2013 12:01 PM

## 2013-03-01 NOTE — Progress Notes (Signed)
NUTRITION FOLLOW UP and Consult Consult for TF management 8/30  Intervention:   Change TF regimen to Vital AF 1.2 @ 30 ml/hr with 30 ml Pro-stat TID. This will provide 1164 kcal, 99 grams of protein, and 584 ml of free water. This will meet 69% of estimated energy needs and 99% of estimated protein needs.  Add liquid multivitamin daily per tube.  Nutrition Dx:   Inadequate oral intake related to inability to eat AEB npo status; ongoing  Goal:   Enteral nutrition to provide 60-70% of estimated calorie needs (22-25 kcals/kg ideal body weight) and 100% of estimated protein needs, based on ASPEN guidelines for permissive underfeeding in critically ill obese individuals; not currently met (due to propofol being discontinue)  Monitor:   TF tolerance and adequacy; tolerating, 5 ml residual, BM 9/2 Labs; low potassium, high BUN, low GFR, low hemoglobin Weight; 3 lbs above admission wt  Assessment:   Full nutrition assessment 8/28.  Initiation of TF per TF protocol 8/30. Admitted with diarrhea now resolved, Patient now intubated s/p respiratory arrest, found to have PE.    9/2 Propofol has been cut off. Per RN pt may receive a bolus of propofol as needed.   Patient has OG tube in place with tip of tube in the stomach. Vital AF 1.2 is infusing @ 10 ml/hr. 30 ml Prostat via tube 5 times daily. Tube feeding regimen currently providing 788 kcal, 93 grams protein, and 195 ml H2O and meeting 47% of estimated energy needs and 93% of estimated protein needs. Will adjust TF to meet needs.   Residuals: 5 ml  Last bm: 9/2 (loose, black, large)   Height: Ht Readings from Last 1 Encounters:  02/25/13 5\' 2"  (1.575 m)    Weight Status:   Wt Readings from Last 1 Encounters:  03/01/13 193 lb 5.5 oz (87.7 kg)   Patient is currently intubated on ventilator support.  MV: 11.8 L/min Temp:Temp (24hrs), Avg:98.5 F (36.9 C), Min:98 F (36.7 C), Max:98.9 F (37.2 C)  Propofol: none  Re-estimated  needs:  Kcal: 1690 (Goal: 1610-9604) Protein: >/=100 gm Fluid: >1.8 L  Skin: red peri area of buttocks, intact  Diet Order: NPO   Intake/Output Summary (Last 24 hours) at 03/01/13 1146 Last data filed at 03/01/13 1100  Gross per 24 hour  Intake 1933.23 ml  Output   6525 ml  Net -4591.77 ml    Last BM: 9/2, diarrhea   Labs:   Recent Labs Lab 02/27/13 0415 02/28/13 0505 03/01/13 0500  NA 136 139 143  K 4.0 3.3* 2.8*  CL 112 108 108  CO2 15* 19 24  BUN 21 25* 29*  CREATININE 0.92 0.86 0.84  CALCIUM 8.2* 7.6* 8.3*  MG 1.7 1.8 1.6  PHOS 2.2* 2.9 2.8  GLUCOSE 198* 167* 142*    CBG (last 3)   Recent Labs  03/01/13 0400 03/01/13 0731 03/01/13 1129  GLUCAP 143* 125* 176*    Scheduled Meds: . antiseptic oral rinse  15 mL Mouth Rinse BID  . antiseptic oral rinse  15 mL Mouth Rinse QID  . aspirin  81 mg Per Tube Daily  . atorvastatin  10 mg Oral q1800  . chlorhexidine  15 mL Mouth Rinse BID  . feeding supplement  30 mL Per Tube Q2000  . feeding supplement  60 mL Per Tube BID  . furosemide  40 mg Intravenous BID  . metoprolol  5 mg Intravenous Q6H  . metroNIDAZOLE  500 mg Oral Q8H  .  multivitamin  5 mL Per Tube Daily  . pantoprazole (PROTONIX) IV  40 mg Intravenous Q24H  . potassium chloride  10 mEq Intravenous Q1 Hr x 6  . potassium chloride  40 mEq Per Tube BID  . saccharomyces boulardii  250 mg Oral BID    Continuous Infusions: . feeding supplement (VITAL AF 1.2 CAL) 1,000 mL (02/28/13 1724)  . fentaNYL infusion INTRAVENOUS 200 mcg/hr (03/01/13 0406)  . heparin 1,050 Units/hr (02/28/13 2130)  . propofol 40 mcg/kg/min (03/01/13 0410)    Ian Malkin RD, LDN Inpatient Clinical Dietitian Pager: [phone] After Hours Pager: [phone]

## 2013-03-01 NOTE — Progress Notes (Signed)
eLink Physician-Brief Progress Note Patient Name: [name] DOB: 11-22-1945 MRN: [id number]  Date of Service  03/01/2013   HPI/Events of Note   K low  Mg Low  eICU Interventions  K and Mg repletion    Intervention Category Major Interventions: Electrolyte abnormality - evaluation and management  Shan Levans 03/01/2013, 6:03 AM

## 2013-03-02 ENCOUNTER — Inpatient Hospital Stay (HOSPITAL_COMMUNITY): Payer: Medicare Other

## 2013-03-02 DIAGNOSIS — IMO0001 Reserved for inherently not codable concepts without codable children: Secondary | ICD-10-CM

## 2013-03-02 LAB — GLUCOSE, CAPILLARY
Glucose-Capillary: 143 mg/dL — ABNORMAL HIGH (ref 70–99)
Glucose-Capillary: 162 mg/dL — ABNORMAL HIGH (ref 70–99)
Glucose-Capillary: 177 mg/dL — ABNORMAL HIGH (ref 70–99)
Glucose-Capillary: 193 mg/dL — ABNORMAL HIGH (ref 70–99)
Glucose-Capillary: 200 mg/dL — ABNORMAL HIGH (ref 70–99)

## 2013-03-02 LAB — CBC WITH DIFFERENTIAL/PLATELET
Eosinophils Absolute: 0.2 10*3/uL (ref 0.0–0.7)
Eosinophils Relative: 1 % (ref 0–5)
Lymphs Abs: 2.7 10*3/uL (ref 0.7–4.0)
MCH: 27 pg (ref 26.0–34.0)
MCHC: 30.7 g/dL (ref 30.0–36.0)
MCV: 88 fL (ref 78.0–100.0)
Platelets: 307 10*3/uL (ref 150–400)
RBC: 3.59 MIL/uL — ABNORMAL LOW (ref 3.87–5.11)
RDW: 16.7 % — ABNORMAL HIGH (ref 11.5–15.5)

## 2013-03-02 LAB — BASIC METABOLIC PANEL
BUN: 22 mg/dL (ref 6–23)
CO2: 29 mEq/L (ref 19–32)
Calcium: 8.5 mg/dL (ref 8.4–10.5)
Creatinine, Ser: 0.64 mg/dL (ref 0.50–1.10)
Glucose, Bld: 159 mg/dL — ABNORMAL HIGH (ref 70–99)

## 2013-03-02 MED ORDER — IPRATROPIUM BROMIDE 0.02 % IN SOLN: 0.5000 mg | RESPIRATORY_TRACT | Status: DC | PRN

## 2013-03-02 MED ORDER — PREGABALIN 100 MG PO CAPS
100.0000 mg | ORAL_CAPSULE | Freq: Two times a day (BID) | ORAL | Status: DC
  Administered 2013-03-02 (×2): 100 mg
  Filled 2013-03-02 (×2): qty 1

## 2013-03-02 MED ORDER — FENTANYL CITRATE 0.05 MG/ML IJ SOLN
25.0000 ug | INTRAMUSCULAR | Status: DC | PRN
  Administered 2013-03-07: 12.5 ug via INTRAVENOUS
  Filled 2013-03-02: qty 2

## 2013-03-02 MED ORDER — MIDAZOLAM HCL 2 MG/2ML IJ SOLN: 2.0000 mg | INTRAMUSCULAR | Status: DC | PRN

## 2013-03-02 MED ORDER — PANTOPRAZOLE SODIUM 40 MG PO PACK
40.0000 mg | PACK | ORAL | Status: DC
  Administered 2013-03-02: 40 mg
  Filled 2013-03-02 (×2): qty 20

## 2013-03-02 MED ORDER — DEXMEDETOMIDINE HCL IN NACL 200 MCG/50ML IV SOLN
0.2000 ug/kg/h | INTRAVENOUS | Status: AC
  Administered 2013-03-02 (×4): 0.6 ug/kg/h via INTRAVENOUS
  Administered 2013-03-02: 0.4 ug/kg/h via INTRAVENOUS
  Administered 2013-03-03 (×2): 0.6 ug/kg/h via INTRAVENOUS
  Filled 2013-03-02 (×7): qty 50

## 2013-03-02 MED ORDER — LEVALBUTEROL HCL 0.63 MG/3ML IN NEBU: 0.6300 mg | INHALATION_SOLUTION | RESPIRATORY_TRACT | Status: DC | PRN

## 2013-03-02 MED ORDER — METOPROLOL TARTRATE 25 MG/10 ML ORAL SUSPENSION
50.0000 mg | Freq: Two times a day (BID) | ORAL | Status: DC
  Administered 2013-03-02: 50 mg
  Filled 2013-03-02 (×4): qty 20

## 2013-03-02 NOTE — Progress Notes (Signed)
PULMONARY  / CRITICAL CARE MEDICINE  Name: [name] MRN: [id number] DOB: [birth year]    ADMISSION DATE:  02/23/2013  REFERRING MD :  EDP PRIMARY SERVICE: PCCM  REASON FOR ADMISSION: persistent hypotension after volume resuscitation  BRIEF PATIENT DESCRIPTION:  67 yo female presented with several weeks of N/V, headache, anorexia, weakness and diarrhea.  Found to be hypotensive in ED, and PCCM asked to admit with shock (hypovolemic vs septic), hyponatremia, and renal failure.  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  SIGNIFICANT EVENTS: 8/27 Admit ICU with shock on pressors, GI consulted 8/29 c/o chest pain, Lt arm pain >> troponin elevated, pulmonary edema with VDRF, sigmoidoscopy cancelled 8/30 CT chest with PE >> start heparin gtt 9/01 Cardiology consulted 9/03 Add precedex for sedation  STUDIES: 8/27 CT abd/pelvis >> dilated gallbladder, no wall thickening or pericholecystic fluid, no gallstones 8/28 Abd u/s >> dilated gallbladder, chronic CBD dilation 8/30 Echo >> severe hypokinesis of mid-distalanteroseptal and anterior myocardium; akinesis of mid-distallateral, inferolateral, inferior, and inferoseptal myocardium; grade 3 diastolic dysfx; mild MR 8/30 CT chest >> b/l pleural effusions, basilar ATX, small PE superior segment RLL, coronary calcification 9/02 Doppler legs b/l >> no DVT  LINES / TUBES: L IJ CVL 8/26 >>  ETT 8/29>> Aline 8/29>>9/1  CULTURES: MRSA PCR 08/26 >> negative C diff 8/26 >> negative Stool 8/26 >> negative Urine 8/26 >> negative final Blood 8/26 >> negative  Stool 8/31 >>   ANTIBIOTICS: Vancomycin and Zosyn 08/27 >> 08/27 Ciprofloxacin 8/26 >>  9/02 Metronidazole (PO) 8/26 >>  9/03  SUBJECTIVE: Not cooperating with physical exam >> does opposite of what is being asked.  Anxious, tremulous.  VITAL SIGNS: Temp:  [98 F (36.7 C)-99.2 F (37.3 C)] 99.2 F (37.3 C) (09/03 0800) Pulse Rate:  [87-123] 87 (09/03  0700) Resp:  [12-28] 12 (09/03 0700) BP: (97-128)/(55-78) 124/65 mmHg (09/03 0600) SpO2:  [96 %-100 %] 98 % (09/03 0700) FiO2 (%):  [30 %] 30 % (09/03 0700) Weight:  [188 lb 11.4 oz (85.6 kg)] 188 lb 11.4 oz (85.6 kg) (09/03 0500)  VENTILATOR SETTINGS: Vent Mode:  [-] PRVC FiO2 (%):  [30 %] 30 % Set Rate:  [12 bmp] 12 bmp Vt Set:  [400 mL] 400 mL PEEP:  [5 cmH20] 5 cmH20 Pressure Support:  [10 cmH20-15 cmH20] 15 cmH20 Plateau Pressure:  [8 cmH20-15 cmH20] 8 cmH20  INTAKE / OUTPUT: Intake/Output     09/02 0701 - 09/03 0700 09/03 0701 - 09/04 0700   I.V. (mL/kg) 1078 (12.6)    NG/GT 780    IV Piggyback 250    Total Intake(mL/kg) 2108 (24.6)    Urine (mL/kg/hr) 6500 (3.2)    Stool     Total Output 6500     Net -4392          Stool Occurrence 1 x      PHYSICAL EXAMINATION: General: Anxious Neuro: Tremulous, has purposeful movements with painful stimulation, otherwise not cooperating with exam HEENT: ETT, OG in place Cardiovascular: regular, tachycardic, no murmur Lungs: decreased breath sounds at bases, no wheeze Abdomen: soft, non tender, + bowel sounds Ext: no edema Skin: no rashes  LABS: CBC Recent Labs     02/28/13  0505  03/01/13  0500  03/02/13  0530  WBC  15.1*  17.1*  17.5*  HGB  9.2*  9.6*  9.7*  HCT  28.2*  29.5*  31.6*  PLT  267  303  307  BMET Recent Labs     03/01/13  0500  03/01/13  1345  03/02/13  0530  NA  143  142  145  K  2.8*  4.3  4.1  CL  108  107  110  CO2  24  24  29   BUN  29*  28*  22  CREATININE  0.84  0.77  0.64  GLUCOSE  142*  162*  159*    Electrolytes Recent Labs     02/28/13  0505  03/01/13  0500  03/01/13  1345  03/02/13  0530  CALCIUM  7.6*  8.3*  8.6  8.5  MG  1.8  1.6   --   1.8  PHOS  2.9  2.8   --    --     Sepsis Markers Recent Labs     02/28/13  0505  03/01/13  0500  PROCALCITON  0.50  0.27   Cardiac Enzymes Recent Labs     03/01/13  0500  TROPONINI  <0.30  PROBNP  24242.0*     Glucose Recent Labs     03/01/13  1129  03/01/13  1508  03/01/13  2031  03/01/13  2347  03/02/13  0346  03/02/13  0737  GLUCAP  176*  158*  162*  151*  143*  149*    Imaging Dg Chest Port 1 View  03/02/2013   *RADIOLOGY REPORT*  Clinical Data: CHF  PORTABLE CHEST - 1 VIEW  Comparison: Prior radiograph from 03/01/2013  Located 4.9 cm above the carina.  Enteric tube courses into the abdomen.  Cardiomegaly not significantly changed.  Allowing for patient rotation, diffuse prominence of the interstitial markings and pulmonary vascular congestion is grossly similar as compared to the prior exam. A left pleural effusion is slightly improved.  Dense retrocardiac left lower lobe opacity persists, likely atelectasis and / or effusion.  No pneumothorax.  Osseous structures are unchanged.  IMPRESSION: 1. Tip of the endotracheal tube 4.9 cm above the carina.  Remaining support apparatus in stable position. 2.  Persistent pulmonary edema with slightly improved left pleural effusions. 3.  Persistent dense retrocardiac left lower lobe opacity, likely a combination of atelectasis and effusion.   Original Report Authenticated By: Rise Mu, M.D.   Dg Chest Port 1 View  03/01/2013   *RADIOLOGY REPORT*  Clinical Data: Check ETT  PORTABLE CHEST - 1 VIEW  Comparison: 02/28/2013  Findings: Left basilar opacity, likely a combination of atelectasis and small pleural effusion.  Mild pulmonary vascular congestion without frank interstitial edema.  Mild cardiomegaly.  Endotracheal tube terminates 4.5 cm above the carina.  Enteric tube courses below the diaphragm.  Left IJ venous catheter terminates in the left brachiocephalic vein.  IMPRESSION: Endotracheal tube terminates 4.5 cm above the carina.  Left basilar opacity, likely a combination of atelectasis and small pleural effusion.  Additional support apparatus as above.   Original Report Authenticated By: Charline Bills, M.D.      ASSESSMENT /  PLAN:  PULMONARY A: Acute respiratory failure 2nd to acute pulmonary edema. Acute PE >> very small; unsure this is cause of her respiratory distress. ?sleep disordered breathing. P:   -pressure support wean as tolerated >> psychiatric issues may limit ability to wean effectively -goal negative fluid balance -continue heparin gtt -likely will need CPAP/BiPAP after extubation >> outpt assessment for sleep apnea  CARDIOVASCULAR A: Shock on admission >> ? Hypovolemic +/- sepsis.  Off pressors. Acute systolic heart failure >> ? Coronary ischemia with  NSTEMI vs stress cardiomyopathy (more likely). Acute on chronic diastolic heart failure. Hx of hypertension, hyperlipidemia. P:  -continue ASA, lipitor, lopressor -defer ACE inhibitor to cardiology  -plan for cardiac cath when extubated  RENAL A: Acute renal failure 2nd to hypovolemia, shock >> resolved. Non anion gap acidosis 2nd to diarrhea and renal failure >> resolved. Hypokalemia >> improved. Hypomagnesemia >> improved. P:   -monitor renal fx, urine outpt -f/u and replace electrolytes as needed -continue schedule KCL while getting scheduled lasix  GASTROINTESTINAL A:  N/V and diarrhea in setting of IBS >> diarrhea improved. Heme + stools >> no overt bleeding. Nutrition. P:   -defer sigmoidoscopy until more stable -tube feeds while on vent -protonix for SUP  HEMATOLOGIC A: Anemia. P:  -f/u CBC -transfuse for Hb < 8 with concern for ACS  INFECTIOUS A: Gastroenteritis P:   -completed Abx >> monitor clinically  ENDOCRINE A: Reported hx of hypothyroidism >> TSH 3.9 from 8/28. Relative adrenal insufficiency >> weaned off solu cortef. Hyperglycemia. P:   -SSI  NEUROLOGIC A: Chronic pain, anxiety, fibromyalgia. Concerned psychiatric issues may contribute to difficulty with vent weaning P:   -changed to precedex 9/03 -resume lyrica 9/03 -prn versed, fentanyl  Code status >> full code  CC time 35  minutes.  Coralyn Helling, MD [facility] Pulmonary/Critical Care 03/02/2013, 8:34 AM Pager:  [phone] After 3pm call: [phone]

## 2013-03-02 NOTE — Progress Notes (Signed)
NUTRITION FOLLOW UP Consult for TF management 8/30  Intervention:   Continue Vital AF 1.2 @ 30 ml/hr with 30 ml Pro-stat TID. This will provide 1164 kcal, 99 grams of protein, and 584 ml of free water. This will meet 71% of estimated energy needs and 99% of estimated protein needs.  Continue liquid multivitamin daily per tube.  Nutrition Dx:   Inadequate oral intake related to inability to eat AEB npo status; ongoing  Goal:   Enteral nutrition to provide 60-70% of estimated calorie needs (22-25 kcals/kg ideal body weight) and 100% of estimated protein needs, based on ASPEN guidelines for permissive underfeeding in critically ill obese individuals; being met  Monitor:   TF tolerance and adequacy; tolerating, 5 ml residual, BM 9/2 Labs; low potassium, high BUN, low GFR, low hemoglobin Weight; 2 lbs below admission wt  Assessment:   Full nutrition assessment 8/28.  Initiation of TF per TF protocol 8/30. Admitted with diarrhea now resolved, Patient now intubated s/p respiratory arrest, found to have PE.    9/2 Propofol has been cut off. Per RN pt may receive a bolus of propofol as needed.   9/3 Per multidisciplinary rounds pt is being weaned off vent support and is tolerating TF well. Rate of TF was increased yesterday- no complications today.  Patient has OGT in place with tip of tube in the stomach. Vital Af 1.2 is infusing @ 30 ml/hr. 30 ml Prostat via tube TID. Tube feeding regimen currently providing 1164 kcal, 99 grams protein, and 584 ml H2O.  Residuals: 5 ml Last bm: 9/2    Height: Ht Readings from Last 1 Encounters:  02/25/13 5\' 2"  (1.575 m)    Weight Status:   Wt Readings from Last 1 Encounters:  03/02/13 188 lb 11.4 oz (85.6 kg)   Patient is currently intubated on ventilator support.  MV:  9.9 L/min Temp:Temp (24hrs), Avg:98.7 F (37.1 C), Min:98 F (36.7 C), Max:99.2 F (37.3 C)  Propofol: none  Re-estimated needs:  Kcal: 1628 (Goal: [phone]) Protein:  >/=100 gm Fluid: >1.8 L  Skin: red peri area of buttocks, intact  Diet Order: NPO   Intake/Output Summary (Last 24 hours) at 03/02/13 1415 Last data filed at 03/02/13 1300  Gross per 24 hour  Intake 1880.1 ml  Output   5375 ml  Net -3494.9 ml    Last BM: 9/2, diarrhea   Labs:   Recent Labs Lab 02/27/13 0415 02/28/13 0505 03/01/13 0500 03/01/13 1345 03/02/13 0530  NA 136 139 143 142 145  K 4.0 3.3* 2.8* 4.3 4.1  CL 112 108 108 107 110  CO2 15* 19 24 24 29   BUN 21 25* 29* 28* 22  CREATININE 0.92 0.86 0.84 0.77 0.64  CALCIUM 8.2* 7.6* 8.3* 8.6 8.5  MG 1.7 1.8 1.6  --  1.8  PHOS 2.2* 2.9 2.8  --   --   GLUCOSE 198* 167* 142* 162* 159*    CBG (last 3)   Recent Labs  03/02/13 0346 03/02/13 0737 03/02/13 1146  GLUCAP 143* 149* 200*    Scheduled Meds: . antiseptic oral rinse  15 mL Mouth Rinse QID  . aspirin  81 mg Per Tube Daily  . atorvastatin  10 mg Oral q1800  . chlorhexidine  15 mL Mouth Rinse BID  . feeding supplement  30 mL Per Tube TID WC  . feeding supplement (VITAL AF 1.2 CAL)  711 mL Per Tube Q24H  . furosemide  40 mg Intravenous BID  . metoprolol  tartrate  50 mg Per Tube BID  . multivitamin  5 mL Per Tube Daily  . pantoprazole sodium  40 mg Per Tube Q24H  . potassium chloride  40 mEq Per Tube BID  . pregabalin  100 mg Per Tube BID  . saccharomyces boulardii  250 mg Oral BID    Continuous Infusions: . dexmedetomidine 0.6 mcg/kg/hr (03/02/13 0921)  . heparin 1,050 Units/hr (03/01/13 1630)    Ian Malkin RD, LDN Inpatient Clinical Dietitian Pager: [phone] After Hours Pager: [phone]

## 2013-03-02 NOTE — Progress Notes (Signed)
    Subjective:  Unable to obtain any history. Pt intubated and not responding to me.  Objective:  Vital Signs in the last 24 hours: Temp:  [98 F (36.7 C)-99.2 F (37.3 C)] 99.2 F (37.3 C) (09/03 0800) Pulse Rate:  [87-124] 99 (09/03 1000) Resp:  [12-31] 28 (09/03 1000) BP: (97-152)/(55-81) 123/65 mmHg (09/03 1000) SpO2:  [95 %-100 %] 95 % (09/03 1000) FiO2 (%):  [30 %] 30 % (09/03 1025) Weight:  [188 lb 11.4 oz (85.6 kg)] 188 lb 11.4 oz (85.6 kg) (09/03 0500)  Intake/Output from previous day: 09/02 0701 - 09/03 0700 In: 2108 [I.V.:1078; NG/GT:780; IV Piggyback:250] Out: 6500 [Urine:6500]  Physical Exam: Pt is intubated, withdraws to touch but not really purposeful. Does not open her eyes for me. Appears agitated. HEENT: normal Neck: JVP - normal, carotids 2+= without bruits Lungs: CTA bilaterally CV: tachy and regular without murmur or gallop Abd: soft, Positive BS, no hepatomegaly Ext: no C/C/E, distal pulses intact and equal Skin: warm/dry no rash  Lab Results:  Recent Labs  03/01/13 0500 03/02/13 0530  WBC 17.1* 17.5*  HGB 9.6* 9.7*  PLT 303 307    Recent Labs  03/01/13 1345 03/02/13 0530  NA 142 145  K 4.3 4.1  CL 107 110  CO2 24 29  GLUCOSE 162* 159*  BUN 28* 22  CREATININE 0.77 0.64    Recent Labs  03/01/13 0500  TROPONINI <0.30    Tele: Sinus tach, personally reviewed.  Assessment/Plan:  1. Hypovolemic shock. Recovered with resuscitative efforts. 2. NSTEMI/severe LV dysfunction. Suspect Takotsubo Syndrome but ACS also in differential. Continue current Rx with heparin, ASA, statin. 3. Acute systolic heart failure. Continue supportive care for now. If Takotsubo's would anticipate rapid recovery. 4. Altered mental status.  Continue supportive measures and current care. Plan cardiac cath as she recovers and stabilizes.   Tonny Bollman, M.D. 03/02/2013, 10:33 AM

## 2013-03-02 NOTE — Progress Notes (Addendum)
ANTICOAGULATION CONSULT NOTE - Follow-up Consult  Pharmacy Consult for Heparin Indication: PE  No Known Allergies  Patient Measurements: Height: 5\' 2"  (157.5 cm) Weight: 188 lb 11.4 oz (85.6 kg) IBW/kg (Calculated) : 50.1 Heparin Dosing Weight: 70 kg  Vital Signs: Temp: 99.2 F (37.3 C) (09/03 0800) Temp src: Oral (09/03 0800) BP: 124/65 mmHg (09/03 0600) Pulse Rate: 87 (09/03 0700)  Labs:  Recent Labs  02/28/13 0505  03/01/13 0400 03/01/13 0500 03/01/13 0930 03/01/13 1345 03/02/13 0530  HGB 9.2*  --   --  9.6*  --   --  9.7*  HCT 28.2*  --   --  29.5*  --   --  31.6*  PLT 267  --   --  303  --   --  307  HEPARINUNFRC 0.31  < > 0.46  --  0.52  --  0.61  CREATININE 0.86  --   --  0.84  --  0.77 0.64  CKTOTAL  --   --   --  210*  --   --   --   TROPONINI  --   --   --  <0.30  --   --   --   < > = values in this interval not displayed.  Estimated Creatinine Clearance: 71.2 ml/min (by C-G formula based on Cr of 0.64).   Medical History: Past Medical History  Diagnosis Date  . IBS (irritable bowel syndrome)   . Diverticulitis   . Hypertension   . Fibromyalgia   . Systolic dysfunction, left ventricle     a. 01/2013 Echo: Sev HK of mid-dist antsept, ant, apical walls; mid-dist lat/inflat/inf/infsept AK, Gr 3 DD, Mild MR.  . NSTEMI (non-ST elevated myocardial infarction)     a. 01/2013  . PE (pulmonary embolism)     a. 01/2013 CTA Chest: small segmental PE to the superior segment of RLL.    Medications:  Scheduled:  . antiseptic oral rinse  15 mL Mouth Rinse BID  . antiseptic oral rinse  15 mL Mouth Rinse QID  . aspirin  81 mg Per Tube Daily  . atorvastatin  10 mg Oral q1800  . chlorhexidine  15 mL Mouth Rinse BID  . feeding supplement  30 mL Per Tube TID WC  . feeding supplement (VITAL AF 1.2 CAL)  711 mL Per Tube Q24H  . feeding supplement (VITAL AF 1.2 CAL)  720 mL Per Tube Once  . furosemide  40 mg Intravenous BID  . metoprolol  5 mg Intravenous Q6H  .  metroNIDAZOLE  500 mg Oral Q8H  . multivitamin  5 mL Per Tube Daily  . pantoprazole (PROTONIX) IV  40 mg Intravenous Q24H  . potassium chloride  40 mEq Per Tube BID  . saccharomyces boulardii  250 mg Oral BID   Infusions:  . fentaNYL infusion INTRAVENOUS 200 mcg/hr (03/01/13 2252)  . heparin 1,050 Units/hr (03/01/13 1630)  . propofol 20 mcg/kg/min (03/01/13 1800)    Assessment: 67 yr female admitted 8/27 with persistent hypotension, suspected gastroenteritis, acute renal failure and hypovolemic shock.  8/29 pt developed atypical chest pain with elevated troponins and respiratory distress requiring intubation.  RLL PE also noted on CT.  IV heparin started 8/30.   Cardiac catheterization planned once patient stable from a respiratory standpoint and prior to placing on long term oral anticoagulant.  9/3 HL therapeutic @ 0.61 (goal 0.3-0.7) with infusion running at 1050 units/hr.  Hgb low but stable, platelets WNL  SCr WNL, CrCl ~  59ml/min. No bleeding/complications reported. Bilateral LE dopplers negative for DVT.   Goal of Therapy:  Heparin level 0.3-0.7 units/ml Monitor platelets by anticoagulation protocol: Yes   Plan:   Continue heparin rate at 1050 units/hr (=10.5 ml/hr)  Check heparin level in 6 hrs.  Although HL therapeutic, has been trending up.  Check heparin level and CBC daily while on heparin  Clance Boll, PharmD, BCPS Pager: [phone] 03/02/2013 8:23 AM   Addendum: 03/02/2013 2:30 PM HL 0.48 Repeated heparin level remains therapeutic and no longer trending up. Plan: Continue heparin at 1050 units/hr (10.5 ml/hr).  Next HL tomorrow AM with morning labs.  Clance Boll, PharmD, BCPS Pager: [phone] 03/02/2013 2:31 PM

## 2013-03-03 ENCOUNTER — Inpatient Hospital Stay (HOSPITAL_COMMUNITY): Payer: Medicare Other

## 2013-03-03 LAB — GLUCOSE, CAPILLARY
Glucose-Capillary: 185 mg/dL — ABNORMAL HIGH (ref 70–99)
Glucose-Capillary: 183 mg/dL — ABNORMAL HIGH (ref 70–99)
Glucose-Capillary: 131 mg/dL — ABNORMAL HIGH (ref 70–99)
Glucose-Capillary: 100 mg/dL — ABNORMAL HIGH (ref 70–99)

## 2013-03-03 LAB — STOOL CULTURE

## 2013-03-03 LAB — BASIC METABOLIC PANEL
Calcium: 8.8 mg/dL (ref 8.4–10.5)
Creatinine, Ser: 0.69 mg/dL (ref 0.50–1.10)
GFR calc Af Amer: >90 mL/min (ref >90)
GFR calc non Af Amer: 89 mL/min — ABNORMAL LOW (ref >90)
Sodium: 148 mEq/L — ABNORMAL HIGH (ref 135–145)

## 2013-03-03 LAB — CBC
MCV: 89.5 fL (ref 78.0–100.0)
Platelets: 268 10*3/uL (ref 150–400)
RBC: 3.51 MIL/uL — ABNORMAL LOW (ref 3.87–5.11)
RDW: 16.8 % — ABNORMAL HIGH (ref 11.5–15.5)
WBC: 13.3 10*3/uL — ABNORMAL HIGH (ref 4.0–10.5)

## 2013-03-03 LAB — HEPARIN LEVEL (UNFRACTIONATED): Heparin Unfractionated: 0.67 IU/mL (ref 0.30–0.70)

## 2013-03-03 MED ORDER — INSULIN ASPART 100 UNIT/ML ~~LOC~~ SOLN
0.0000 [IU] | SUBCUTANEOUS | Status: DC
  Administered 2013-03-03 (×2): 3 [IU] via SUBCUTANEOUS

## 2013-03-03 MED ORDER — ADULT MULTIVITAMIN LIQUID CH
5.0000 mL | Freq: Every day | ORAL | Status: DC
  Administered 2013-03-03 – 2013-03-04 (×2): 5 mL via ORAL
  Filled 2013-03-03 (×2): qty 5

## 2013-03-03 MED ORDER — PANTOPRAZOLE SODIUM 40 MG PO TBEC
40.0000 mg | DELAYED_RELEASE_TABLET | Freq: Every day | ORAL | Status: DC
  Administered 2013-03-03 – 2013-03-08 (×5): 40 mg via ORAL
  Filled 2013-03-03 (×6): qty 1

## 2013-03-03 MED ORDER — PREGABALIN 50 MG PO CAPS
100.0000 mg | ORAL_CAPSULE | Freq: Two times a day (BID) | ORAL | Status: DC
  Administered 2013-03-03 – 2013-03-08 (×9): 100 mg via ORAL
  Filled 2013-03-03 (×3): qty 2
  Filled 2013-03-03: qty 1
  Filled 2013-03-03: qty 2
  Filled 2013-03-03 (×2): qty 1
  Filled 2013-03-03 (×3): qty 2

## 2013-03-03 MED ORDER — POTASSIUM CHLORIDE CRYS ER 20 MEQ PO TBCR
40.0000 meq | EXTENDED_RELEASE_TABLET | Freq: Every day | ORAL | Status: DC
  Administered 2013-03-03 – 2013-03-08 (×5): 40 meq via ORAL
  Filled 2013-03-03 (×6): qty 2

## 2013-03-03 MED ORDER — METOPROLOL TARTRATE 25 MG/10 ML ORAL SUSPENSION
50.0000 mg | Freq: Two times a day (BID) | ORAL | Status: DC
  Administered 2013-03-03 – 2013-03-07 (×9): 50 mg via ORAL
  Filled 2013-03-03 (×13): qty 20

## 2013-03-03 MED ORDER — ASPIRIN 81 MG PO CHEW
81.0000 mg | CHEWABLE_TABLET | Freq: Every day | ORAL | Status: DC
  Administered 2013-03-03 – 2013-03-08 (×5): 81 mg via ORAL
  Filled 2013-03-03 (×5): qty 1

## 2013-03-03 MED ORDER — INSULIN ASPART 100 UNIT/ML ~~LOC~~ SOLN
0.0000 [IU] | Freq: Three times a day (TID) | SUBCUTANEOUS | Status: DC
  Administered 2013-03-03: 4 [IU] via SUBCUTANEOUS
  Administered 2013-03-03: 3 [IU] via SUBCUTANEOUS
  Administered 2013-03-04: 4 [IU] via SUBCUTANEOUS
  Administered 2013-03-04: 3 [IU] via SUBCUTANEOUS
  Administered 2013-03-04: 4 [IU] via SUBCUTANEOUS
  Administered 2013-03-05: 3 [IU] via SUBCUTANEOUS
  Administered 2013-03-06 (×2): via SUBCUTANEOUS
  Administered 2013-03-07: 3 [IU] via SUBCUTANEOUS

## 2013-03-03 MED ORDER — POTASSIUM CHLORIDE 20 MEQ/15ML (10%) PO LIQD: 40.0000 meq | Freq: Every day | ORAL | Status: DC

## 2013-03-03 MED ORDER — FUROSEMIDE 40 MG PO TABS
40.0000 mg | ORAL_TABLET | Freq: Every day | ORAL | Status: DC
  Administered 2013-03-03 – 2013-03-05 (×3): 40 mg via ORAL
  Filled 2013-03-03 (×5): qty 1

## 2013-03-03 NOTE — Progress Notes (Signed)
    Subjective:  Intubated, awake. No pain.  Objective:  Vital Signs in the last 24 hours: Temp:  [98.7 F (37.1 C)-100.8 F (38.2 C)] 99.4 F (37.4 C) (09/04 0400) Pulse Rate:  [65-124] 70 (09/04 0700) Resp:  [15-32] 18 (09/04 0700) BP: (99-152)/(43-81) 107/43 mmHg (09/04 0600) SpO2:  [95 %-100 %] 99 % (09/04 0700) FiO2 (%):  [30 %] 30 % (09/04 0400) Weight:  [182 lb 12.2 oz (82.9 kg)] 182 lb 12.2 oz (82.9 kg) (09/04 0500)  Intake/Output from previous day: 09/03 0701 - 09/04 0700 In: 1812.8 [I.V.:882.8; NG/GT:930] Out: 3155 [Urine:3155]  Physical Exam: Pt is awake and alert, NAD, intubated HEENT: normal Neck: JVP - normal Lungs: CTA bilaterally CV: RRR without murmur or gallop Abd: soft, NT, Positive BS, no hepatomegaly Ext: no C/C/E, distal pulses intact and equal Skin: warm/dry no rash   Lab Results:  Recent Labs  03/02/13 0530 03/03/13 0520  WBC 17.5* 13.3*  HGB 9.7* 9.6*  PLT 307 268    Recent Labs  03/02/13 0530 03/03/13 0520  NA 145 148*  K 4.1 3.5  CL 110 109  CO2 29 32  GLUCOSE 159* 187*  BUN 22 29*  CREATININE 0.64 0.69    Recent Labs  03/01/13 0500  TROPONINI <0.30   Tele: Sinus rhythm, heart rate 70 bpm  Assessment/Plan:  1. NSTEMI - ACS versus stress cardiomyopathy (Takotsubo's) 2. VDRF 3. Acute systolic heart failure - improved  Overall much improved. CXR reviewed and shows markedly improved aeration. Heart rate normalized and no longer tachycardic. I would back off on diuretics at this point (will change to po lasix) and try to keep about even fluid balance. Continue ASA, heparin, beta-blocker, statin. Hopefully will extubate today. Will discuss cardiac cath once she is extubated.  Tonny Bollman, M.D. 03/03/2013, 7:16 AM

## 2013-03-03 NOTE — Progress Notes (Signed)
CARE MANAGEMENT NOTE 03/03/2013  Patient:  [name], [name]   Account Number:  [id number]  Date Initiated:  02/24/2013  Documentation initiated by:  DAVIS,RHONDA  Subjective/Objective Assessment:   admitted with arf, hypotensive requiring iv neo gtt,     Action/Plan:   lives at home with spouse and is indep. in all activites   Anticipated DC Date:  03/06/2013   Anticipated DC Plan:  HOME/SELF CARE  In-house referral  NA      DC Planning Services  NA      [facility] Choice  NA   Choice offered to / List presented to:  NA   DME arranged  NA      DME agency  NA     HH arranged  NA      HH agency  NA   Status of service:  In process, will continue to follow Medicare Important Message given?  NA - LOS <3 / Initial given by admissions (If response is "NO", the following Medicare IM given date fields will be blank) Date Medicare IM given:   Date Additional Medicare IM given:    Discharge Disposition:    Per UR Regulation:  Reviewed for med. necessity/level of care/duration of stay  If discussed at Long Length of Stay Meetings, dates discussed:    Comments:  [id number] Stark Jock, BSN, [place] [phone] Chart Reviewed for discharge and hospital needs.  Patient extubated this am tolerated well. Discharge needs at time of review:  None Review of patient progress due on [id number].   [id number] Earlene Plater, RN, BSN, [place] [phone] Chart Reviewed for discharge and hospital needs. Discharge needs at time of review:  None Review of patient progress due on [id number].

## 2013-03-03 NOTE — Progress Notes (Signed)
ANTICOAGULATION CONSULT NOTE - Follow-up Consult  Pharmacy Consult for Heparin Indication: PE  No Known Allergies  Patient Measurements: Height: 5\' 2"  (157.5 cm) Weight: 182 lb 12.2 oz (82.9 kg) IBW/kg (Calculated) : 50.1 Heparin Dosing Weight: 70 kg  Vital Signs: Temp: 99.4 F (37.4 C) (09/04 0400) Temp src: Oral (09/04 0400) BP: 107/43 mmHg (09/04 0600) Pulse Rate: 70 (09/04 0700)  Labs:  Recent Labs  03/01/13 0500  03/01/13 1345 03/02/13 0530 03/02/13 1200 03/03/13 0520  HGB 9.6*  --   --  9.7*  --  9.6*  HCT 29.5*  --   --  31.6*  --  31.4*  PLT 303  --   --  307  --  268  HEPARINUNFRC  --   < >  --  0.61 0.48 0.67  CREATININE 0.84  --  0.77 0.64  --  0.69  CKTOTAL 210*  --   --   --   --   --   TROPONINI <0.30  --   --   --   --   --   < > = values in this interval not displayed.  Estimated Creatinine Clearance: 69.9 ml/min (by C-G formula based on Cr of 0.69).   Medical History: Past Medical History  Diagnosis Date  . IBS (irritable bowel syndrome)   . Diverticulitis   . Hypertension   . Fibromyalgia   . Systolic dysfunction, left ventricle     a. 01/2013 Echo: Sev HK of mid-dist antsept, ant, apical walls; mid-dist lat/inflat/inf/infsept AK, Gr 3 DD, Mild MR.  . NSTEMI (non-ST elevated myocardial infarction)     a. 01/2013  . PE (pulmonary embolism)     a. 01/2013 CTA Chest: small segmental PE to the superior segment of RLL.    Medications:  Scheduled:  . antiseptic oral rinse  15 mL Mouth Rinse QID  . aspirin  81 mg Per Tube Daily  . atorvastatin  10 mg Oral q1800  . chlorhexidine  15 mL Mouth Rinse BID  . feeding supplement  30 mL Per Tube TID WC  . feeding supplement (VITAL AF 1.2 CAL)  711 mL Per Tube Q24H  . furosemide  40 mg Oral Daily  . insulin aspart  0-15 Units Subcutaneous Q4H  . metoprolol tartrate  50 mg Per Tube BID  . multivitamin  5 mL Per Tube Daily  . pantoprazole sodium  40 mg Per Tube Q24H  . potassium chloride  40 mEq Per  Tube BID  . pregabalin  100 mg Per Tube BID  . saccharomyces boulardii  250 mg Oral BID   Infusions:  . dexmedetomidine 0.6 mcg/kg/hr (03/03/13 0656)  . heparin 1,050 Units/hr (03/02/13 1956)    Assessment: 67 yr female admitted 8/27 with persistent hypotension, suspected gastroenteritis, acute renal failure and hypovolemic shock.  8/29 pt developed atypical chest pain with elevated troponins and respiratory distress requiring intubation.  RLL PE also noted on CT.  IV heparin started 8/30.   Cardiac catheterization planned once patient stable from a respiratory standpoint and prior to placing on long term oral anticoagulant.  9/4 HL therapeutic @ 0.67 (goal 0.3-0.7) with infusion running at 1050 units/hr.  Hgb low but stable, platelets WNL  SCr WNL, CrCl ~6ml/min. No bleeding/complications reported. Bilateral LE dopplers negative for DVT.   Goal of Therapy:  Heparin level 0.3-0.7 units/ml Monitor platelets by anticoagulation protocol: Yes   Plan:   Continue heparin rate at 1050 units/hr (=10.5 ml/hr).  Check heparin level  and CBC daily while on heparin.    Clance Boll, PharmD, BCPS Pager: [phone] 03/03/2013 7:34 AM

## 2013-03-03 NOTE — Progress Notes (Signed)
Per CCM MD order, pt extubated.  Pt tolerated well, stable throughout the procedure.  Placed on 3L nasal cannula.

## 2013-03-03 NOTE — Progress Notes (Signed)
eLink Physician-Brief Progress Note Patient Name: [name] DOB: Feb 13, 1946 MRN: [id number]  Date of Service  03/03/2013   HPI/Events of Note  Hyperglycemia on TF with CBGs consistently greater than 175.  eICU Interventions  Plan: Placed on q4 hour SSI - moderate scale   Intervention Category Intermediate Interventions: Hyperglycemia - evaluation and treatment  DETERDING,ELIZABETH 03/03/2013, 12:08 AM

## 2013-03-03 NOTE — Procedures (Signed)
Extubation Procedure Note  Patient Details:   Name: JENENE KAUFFMANN DOB: 08/18/1945 MRN: [id number]   Airway Documentation:     Evaluation  O2 sats: 100% Complications: none Patient  tolerate procedure well. Bilateral Breath Sounds: Diminished Suctioning: Airway Able to whisper  Revonda Humphrey 03/03/2013, 8:38 AM

## 2013-03-03 NOTE — Progress Notes (Signed)
PULMONARY  / CRITICAL CARE MEDICINE  Name: [name] MRN: [id number] DOB: June 08, 1946    ADMISSION DATE:  02/23/2013  REFERRING MD :  EDP PRIMARY SERVICE: PCCM  REASON FOR ADMISSION: persistent hypotension after volume resuscitation  BRIEF PATIENT DESCRIPTION:  67 yo female presented with several weeks of N/V, headache, anorexia, weakness and diarrhea.  Found to be hypotensive in ED, and PCCM asked to admit with shock (hypovolemic vs septic), hyponatremia, and renal failure.  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  SIGNIFICANT EVENTS: 8/27 Admit ICU with shock on pressors, GI consulted 8/29 c/o chest pain, Lt arm pain >> troponin elevated, pulmonary edema with VDRF, sigmoidoscopy cancelled 8/30 CT chest with PE >> start heparin gtt 9/01 Cardiology consulted 9/03 Add precedex for sedation  STUDIES: 8/27 CT abd/pelvis >> dilated gallbladder, no wall thickening or pericholecystic fluid, no gallstones 8/28 Abd u/s >> dilated gallbladder, chronic CBD dilation 8/30 Echo >> severe hypokinesis of mid-distalanteroseptal and anterior myocardium; akinesis of mid-distallateral, inferolateral, inferior, and inferoseptal myocardium; grade 3 diastolic dysfx; mild MR 8/30 CT chest >> b/l pleural effusions, basilar ATX, small PE superior segment RLL, coronary calcification 9/02 Doppler legs b/l >> no DVT  LINES / TUBES: L IJ CVL 8/26 >>  ETT 8/29>> Aline 8/29>>9/1  CULTURES: MRSA PCR 08/26 >> negative C diff 8/26 >> negative Stool 8/26 >> negative Urine 8/26 >> negative final Blood 8/26 >> negative  Stool 8/31 >>   ANTIBIOTICS: Vancomycin and Zosyn 08/27 >> 08/27 Ciprofloxacin 8/26 >>  9/02 Metronidazole (PO) 8/26 >>  9/03  SUBJECTIVE: More alert, and cooperative.  Denies chest, abdominal pain.  Tolerating SBT better.  VITAL SIGNS: Temp:  [98.7 F (37.1 C)-100.8 F (38.2 C)] 99.4 F (37.4 C) (09/04 0400) Pulse Rate:  [65-124] 70 (09/04 0700) Resp:   [15-32] 18 (09/04 0700) BP: (99-152)/(43-81) 107/43 mmHg (09/04 0600) SpO2:  [95 %-100 %] 99 % (09/04 0700) FiO2 (%):  [30 %] 30 % (09/04 0400) Weight:  [182 lb 12.2 oz (82.9 kg)] 182 lb 12.2 oz (82.9 kg) (09/04 0500)  VENTILATOR SETTINGS: Vent Mode:  [-] PRVC FiO2 (%):  [30 %] 30 % Set Rate:  [12 bmp] 12 bmp Vt Set:  [400 mL] 400 mL PEEP:  [5 cmH20] 5 cmH20 Pressure Support:  [5 cmH20] 5 cmH20 Plateau Pressure:  [12 cmH20-20 cmH20] 14 cmH20  INTAKE / OUTPUT: Intake/Output     09/03 0701 - 09/04 0700 09/04 0701 - 09/05 0700   I.V. (mL/kg) 882.8 (10.6)    NG/GT 930    IV Piggyback     Total Intake(mL/kg) 1812.8 (21.9)    Urine (mL/kg/hr) 3155 (1.6)    Total Output 3155     Net -1342.2            PHYSICAL EXAMINATION: General: no distress Neuro: calm, appropriate, follows commands, moves extremities HEENT: ETT, OG in place Cardiovascular: regular, no murmur Lungs: no wheeze/rales Abdomen: soft, non tender, + bowel sounds Ext: no edema Skin: no rashes  LABS: CBC Recent Labs     03/01/13  0500  03/02/13  0530  03/03/13  0520  WBC  17.1*  17.5*  13.3*  HGB  9.6*  9.7*  9.6*  HCT  29.5*  31.6*  31.4*  PLT  303  307  268   BMET Recent Labs     03/01/13  1345  03/02/13  0530  03/03/13  0520  NA  142  145  148*  K  4.3  4.1  3.5  CL  107  110  109  CO2  24  29  32  BUN  28*  22  29*  CREATININE  0.77  0.64  0.69  GLUCOSE  162*  159*  187*    Electrolytes Recent Labs     03/01/13  0500  03/01/13  1345  03/02/13  0530  03/03/13  0520  CALCIUM  8.3*  8.6  8.5  8.8  MG  1.6   --   1.8   --   PHOS  2.8   --    --    --     Sepsis Markers Recent Labs     03/01/13  0500  PROCALCITON  0.27   Cardiac Enzymes Recent Labs     03/01/13  0500  TROPONINI  <0.30  PROBNP  24242.0*    Glucose Recent Labs     03/02/13  0737  03/02/13  1146  03/02/13  1528  03/02/13  2003  03/02/13  2333  03/03/13  0330  GLUCAP  149*  200*  177*  200*  193*   185*    Imaging Dg Chest Port 1 View  03/03/2013   CLINICAL DATA:  Acute respiratory failure with hypoxia. Myocardial infarct.  EXAM: PORTABLE CHEST - 1 VIEW  COMPARISON:  03/02/2013  FINDINGS: Support apparatus remains in appropriate position. Low lung volumes again noted, however both lungs are grossly clear. Tiny left pleural effusion 1st pleural thickening noted. Heart size is normal.  IMPRESSION: Low lung volumes. No acute findings.   Electronically Signed   By: Myles Rosenthal   On: 03/03/2013 07:18   Dg Chest Port 1 View  03/02/2013   *RADIOLOGY REPORT*  Clinical Data: CHF  PORTABLE CHEST - 1 VIEW  Comparison: Prior radiograph from 03/01/2013  Located 4.9 cm above the carina.  Enteric tube courses into the abdomen.  Cardiomegaly not significantly changed.  Allowing for patient rotation, diffuse prominence of the interstitial markings and pulmonary vascular congestion is grossly similar as compared to the prior exam. A left pleural effusion is slightly improved.  Dense retrocardiac left lower lobe opacity persists, likely atelectasis and / or effusion.  No pneumothorax.  Osseous structures are unchanged.  IMPRESSION: 1. Tip of the endotracheal tube 4.9 cm above the carina.  Remaining support apparatus in stable position. 2.  Persistent pulmonary edema with slightly improved left pleural effusions. 3.  Persistent dense retrocardiac left lower lobe opacity, likely a combination of atelectasis and effusion.   Original Report Authenticated By: Rise Mu, M.D.      ASSESSMENT / PLAN:  PULMONARY A: Acute respiratory failure 2nd to acute pulmonary edema >> much improved. Acute PE >> very small; unsure this is cause of her respiratory distress. ?sleep disordered breathing. P:   -pressure support wean as tolerated >> hopefully extubate soon -goal even fluid balance >> agree with decrease in dose of lasix -continue heparin gtt -likely will need CPAP/BiPAP after extubation >> outpt assessment  for sleep apnea  CARDIOVASCULAR A: Shock on admission >> ? Hypovolemic +/- sepsis.  Off pressors. Acute systolic heart failure >> ? Coronary ischemia with NSTEMI vs stress cardiomyopathy (more likely). Acute on chronic diastolic heart failure. Hx of hypertension, hyperlipidemia. P:  -continue ASA, lipitor, lopressor, lipitor -plan for cardiac cath when extubated  RENAL A: Acute renal failure 2nd to hypovolemia, shock >> resolved. Non anion gap acidosis 2nd to diarrhea and renal failure >> resolved. Hypokalemia >>  improved. Hypomagnesemia >> improved. Hypernatremia >> from diuresis. P:   -monitor renal fx, urine outpt -f/u and replace electrolytes as needed -decrease schedule KCL as lasix dose decreased  GASTROINTESTINAL A:  N/V and diarrhea in setting of IBS >> diarrhea improved. Heme + stools >> no overt bleeding. Nutrition. P:   -defer sigmoidoscopy until more stable -tube feeds while on vent -protonix for SUP  HEMATOLOGIC A: Anemia. P:  -f/u CBC intermittently -transfuse for Hb < 8 with concern for ACS  INFECTIOUS A: Gastroenteritis P:   -completed Abx >> monitor clinically  ENDOCRINE A: Reported hx of hypothyroidism >> TSH 3.9 from 8/28. Relative adrenal insufficiency >> weaned off solu cortef. Hyperglycemia. P:   -SSI  NEUROLOGIC A: Chronic pain, anxiety, fibromyalgia. Concerned psychiatric issues may contribute to difficulty with vent weaning >> improved after addition of precedex 9/03. P:   -wean off precedex after extubation -resumed lyrica 9/03 -prn versed, fentanyl -consider adding cymbalta after extubation  Code status >> full code  Updated husband at bedside.  CC time 35 minutes.  Coralyn Helling, MD [facility] Pulmonary/Critical Care 03/03/2013, 7:39 AM Pager:  [phone] After 3pm call: [phone]

## 2013-03-04 ENCOUNTER — Inpatient Hospital Stay (HOSPITAL_COMMUNITY): Payer: Medicare Other

## 2013-03-04 LAB — GLUCOSE, CAPILLARY
Glucose-Capillary: 104 mg/dL — ABNORMAL HIGH (ref 70–99)
Glucose-Capillary: 187 mg/dL — ABNORMAL HIGH (ref 70–99)
Glucose-Capillary: 192 mg/dL — ABNORMAL HIGH (ref 70–99)

## 2013-03-04 LAB — BASIC METABOLIC PANEL
CO2: 35 mEq/L — ABNORMAL HIGH (ref 19–32)
Calcium: 9 mg/dL (ref 8.4–10.5)
Chloride: 105 mEq/L (ref 96–112)
Glucose, Bld: 133 mg/dL — ABNORMAL HIGH (ref 70–99)
Sodium: 146 mEq/L — ABNORMAL HIGH (ref 135–145)

## 2013-03-04 LAB — HEPARIN LEVEL (UNFRACTIONATED): Heparin Unfractionated: 0.63 IU/mL (ref 0.30–0.70)

## 2013-03-04 MED ORDER — HEPARIN (PORCINE) IN NACL 100-0.45 UNIT/ML-% IJ SOLN
1050.0000 [IU]/h | INTRAMUSCULAR | Status: DC
  Administered 2013-03-04 – 2013-03-06 (×3): 1050 [IU]/h via INTRAVENOUS
  Filled 2013-03-04 (×6): qty 250

## 2013-03-04 MED ORDER — LISINOPRIL 2.5 MG PO TABS
2.5000 mg | ORAL_TABLET | Freq: Every day | ORAL | Status: DC
  Administered 2013-03-04 – 2013-03-08 (×4): 2.5 mg via ORAL
  Filled 2013-03-04 (×5): qty 1

## 2013-03-04 MED ORDER — ACETAMINOPHEN 325 MG PO TABS
650.0000 mg | ORAL_TABLET | Freq: Four times a day (QID) | ORAL | Status: DC | PRN
  Administered 2013-03-05: 650 mg via ORAL
  Filled 2013-03-04: qty 2

## 2013-03-04 MED ORDER — ADULT MULTIVITAMIN W/MINERALS CH
1.0000 | ORAL_TABLET | Freq: Every day | ORAL | Status: DC
  Administered 2013-03-04 – 2013-03-08 (×4): 1 via ORAL
  Filled 2013-03-04 (×5): qty 1

## 2013-03-04 MED ORDER — OXYCODONE HCL 5 MG PO TABS
5.0000 mg | ORAL_TABLET | Freq: Four times a day (QID) | ORAL | Status: DC | PRN
  Administered 2013-03-04 – 2013-03-06 (×5): 5 mg via ORAL
  Filled 2013-03-04 (×7): qty 1

## 2013-03-04 MED ORDER — HEPARIN SODIUM (PORCINE) 5000 UNIT/ML IJ SOLN
5000.0000 [IU] | Freq: Three times a day (TID) | INTRAMUSCULAR | Status: DC
  Filled 2013-03-04 (×3): qty 1

## 2013-03-04 MED ORDER — GLUCERNA SHAKE PO LIQD
237.0000 mL | ORAL | Status: DC
  Administered 2013-03-05 – 2013-03-08 (×3): 237 mL via ORAL
  Filled 2013-03-04 (×4): qty 237

## 2013-03-04 MED ORDER — ENSURE PUDDING PO PUDG
1.0000 | Freq: Two times a day (BID) | ORAL | Status: DC
  Administered 2013-03-04 – 2013-03-08 (×6): 1 via ORAL
  Filled 2013-03-04 (×9): qty 1

## 2013-03-04 NOTE — Progress Notes (Signed)
Clinical Social Work Department BRIEF PSYCHOSOCIAL ASSESSMENT 03/04/2013  Patient:  MARIELLEN, BLANEY     Account Number:  [id number]     Admit date:  02/23/2013  Clinical Social Worker:  Orpah Greek  Date/Time:  03/04/2013 03:54 PM  Referred by:  Physician  Date Referred:  03/04/2013 Referred for  SNF Placement   Other Referral:   Interview type:  Patient Other interview type:   and husband at bedside    PSYCHOSOCIAL DATA Living Status:  HUSBAND Admitted from facility:   Level of care:   Primary support name:  Chanay Nugent (husband) cell#: [phone] Primary support relationship to patient:  SPOUSE Degree of support available:   good    CURRENT CONCERNS Current Concerns  Post-Acute Placement   Other Concerns:    SOCIAL WORK ASSESSMENT / PLAN CSW received referral from PT that patient will need SNF at discharge.   Assessment/plan status:  Information/Referral to [facility] Other assessment/ plan:   Information/referral to community resources:   CSW completed FL2 and faxed information out to [facility] - will provide bed offers when available.    PATIENT'S/FAMILY'S RESPONSE TO PLAN OF CARE: Patient & husband are agreeable with plan for SNF - husband states that he would not be able to take care of patient in her current condition.       Unice Bailey, LCSW [facility] Clinical Social Worker cell #: [phone]

## 2013-03-04 NOTE — Evaluation (Signed)
Physical Therapy Evaluation Patient Details Name: [name] MRN: [id number] DOB: [birth year] Today's Date: 03/04/2013 Time: 7829-5621 PT Time Calculation (min): 35 min  PT Assessment / Plan / Recommendation History of Present Illness  Admitted 02/23/13 with sepsis, hypotension, NSTEMI, VDRF, PE. Pt extubated 03/03/13.   Clinical Impression  Pt tolerated mobilizing OOB better than anticipated considering recent medical events. Pt will benefit from PT while in acute care to improve functional mobility, strength to Dc to next venue.    PT Assessment  Patient needs continued PT services    Follow Up Recommendations  SNF    Does the patient have the potential to tolerate intense rehabilitation      Barriers to Discharge        Equipment Recommendations  None recommended by PT    Recommendations for Other Services     Frequency Min 3X/week    Precautions / Restrictions Precautions Precautions: Fall   Pertinent Vitals/Pain Pt reports that her RUE is painful      Mobility  Bed Mobility Bed Mobility: Supine to Sit;Sitting - Scoot to Edge of Bed;Sit to Supine Supine to Sit: HOB elevated;With rails;2: Max assist Sitting - Scoot to Edge of Bed: 3: Mod assist Sit to Supine: 1: +2 Total assist Details for Bed Mobility Assistance: pt was able to assist with moving legs to edge and begin to right trunk, pt required more assistanc to get back into bed. Transfers Transfers: Sit to Stand;Stand to Sit;Stand Pivot Transfers Sit to Stand: 1: +2 Total assist Sit to Stand: Patient Percentage: 50% Stand to Sit: 1: +2 Total assist Stand to Sit: Patient Percentage: 50% Stand Pivot Transfers: 1: +2 Total assist Stand Pivot Transfers: Patient Percentage: 50% Details for Transfer Assistance: verbal cues for trunk uprightness Ambulation/Gait Ambulation/Gait Assistance: Not tested (comment)    Exercises     PT Diagnosis: Difficulty walking;Generalized weakness  PT Problem List: Decreased  strength;Decreased range of motion;Decreased activity tolerance;Decreased mobility;Decreased cognition;Decreased knowledge of use of DME;Decreased safety awareness;Decreased knowledge of precautions;Cardiopulmonary status limiting activity;Impaired sensation PT Treatment Interventions: DME instruction;Gait training;Functional mobility training;Therapeutic activities;Therapeutic exercise;Patient/family education     PT Goals(Current goals can be found in the care plan section) Acute Rehab PT Goals Patient Stated Goal: i want to get up to the bathroom. PT Goal Formulation: With patient/family Time For Goal Achievement: 03/18/13 Potential to Achieve Goals: Good  Visit Information  Last PT Received On: 03/04/13 Assistance Needed: +2 History of Present Illness: Admitted 02/23/13 with sepsis, hypotension, NSTEMI, VDRF, PE. Pt extubated 03/03/13.        Prior Functioning  Home Living Family/patient expects to be discharged to:: Private residence Living Arrangements: Spouse/significant other Available Help at Discharge: Family Type of Home: House Home Access: Level entry Home Layout: One level Home Equipment: Cane - single point Prior Function Level of Independence: Independent with assistive device(s) Communication Communication: No difficulties    Cognition  Cognition Arousal/Alertness: Awake/alert Behavior During Therapy: WFL for tasks assessed/performed Overall Cognitive Status: Impaired/Different from baseline Area of Impairment: Orientation;Memory;Safety/judgement Orientation Level: Time;Situation    Extremity/Trunk Assessment Upper Extremity Assessment Upper Extremity Assessment: Defer to OT evaluation;LUE deficits/detail LUE Deficits / Details: pt does present with decreased shoulder elevation Lower Extremity Assessment Lower Extremity Assessment: Generalized weakness   Balance Balance Balance Assessed: Yes Static Sitting Balance Static Sitting - Balance Support:  Bilateral upper extremity supported Static Sitting - Level of Assistance: 5: Stand by assistance  End of Session PT - End of Session Activity Tolerance: Patient limited by  fatigue;Patient tolerated treatment well Patient left: in bed;with call bell/phone within reach;with bed alarm set Nurse Communication: Mobility status;Need for lift equipment  GP     Rada Hay 03/04/2013, 3:40 PM Blanchard Kelch PT [phone]

## 2013-03-04 NOTE — Progress Notes (Signed)
PULMONARY  / CRITICAL CARE MEDICINE  Name: [name] MRN: [id number] DOB: 09/30/1945    ADMISSION DATE:  02/23/2013  REFERRING MD :  EDP PRIMARY SERVICE: PCCM  REASON FOR ADMISSION: persistent hypotension after volume resuscitation  BRIEF PATIENT DESCRIPTION:  67 yo female presented with several weeks of N/V, headache, anorexia, weakness and diarrhea.  Found to be hypotensive in ED, and PCCM asked to admit with shock (hypovolemic vs septic), hyponatremia, and renal failure.  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  SIGNIFICANT EVENTS: 8/27 Admit ICU with shock on pressors, GI consulted 8/29 c/o chest pain, Lt arm pain >> troponin elevated, pulmonary edema with VDRF, sigmoidoscopy cancelled 8/30 CT chest with PE >> start heparin gtt 9/01 Cardiology consulted 9/03 Add precedex for sedation  STUDIES: 8/27 CT abd/pelvis >> dilated gallbladder, no wall thickening or pericholecystic fluid, no gallstones 8/28 Abd u/s >> dilated gallbladder, chronic CBD dilation 8/30 Echo >> severe hypokinesis of mid-distalanteroseptal and anterior myocardium; akinesis of mid-distallateral, inferolateral, inferior, and inferoseptal myocardium; grade 3 diastolic dysfx; mild MR 8/30 CT chest >> b/l pleural effusions, basilar ATX, small PE superior segment RLL, coronary calcification 9/02 Doppler legs b/l >> no DVT  LINES / TUBES: L IJ CVL 8/26 >> 9/5  ETT 8/29>> 9/4 Aline 8/29>>9/1  CULTURES: MRSA PCR 08/26 >> negative C diff 8/26 >> negative Stool 8/26 >> negative Urine 8/26 >> negative final Blood 8/26 >> negative  Stool 8/31 >> negative  ANTIBIOTICS: Vancomycin and Zosyn 08/27 >> 08/27 Ciprofloxacin 8/26 >>  9/02 Metronidazole (PO) 8/26 >>  9/03  SUBJECTIVE: Hurts all over.  Breathing better.  Tolerating diet.  VITAL SIGNS: Temp:  [98.1 F (36.7 C)-99.5 F (37.5 C)] 98.1 F (36.7 C) (09/05 0800) Pulse Rate:  [81-112] 95 (09/05 0600) Resp:  [17-36] 36 (09/05  0600) BP: (101-133)/(35-65) 109/51 mmHg (09/05 0600) SpO2:  [98 %-100 %] 100 % (09/05 0600) Weight:  [179 lb 7.3 oz (81.4 kg)] 179 lb 7.3 oz (81.4 kg) (09/05 0400) 2 liters  INTAKE / OUTPUT: Intake/Output     09/04 0701 - 09/05 0700 09/05 0701 - 09/06 0700   P.O. 300    I.V. (mL/kg) 701.5 (8.6)    NG/GT 30    Total Intake(mL/kg) 1031.5 (12.7)    Urine (mL/kg/hr) 2285 (1.2)    Stool 100 (0.1)    Total Output 2385     Net -1353.5            PHYSICAL EXAMINATION: General: no distress Neuro: calm, appropriate, follows commands, moves extremities HEENT: no sinus tenderness Cardiovascular: regular, no murmur Lungs: no wheeze/rales Abdomen: soft, non tender, + bowel sounds Ext: no edema Skin: no rashes  LABS: CBC Recent Labs     03/02/13  0530  03/03/13  0520  WBC  17.5*  13.3*  HGB  9.7*  9.6*  HCT  31.6*  31.4*  PLT  307  268   BMET Recent Labs     03/02/13  0530  03/03/13  0520  03/04/13  0515  NA  145  148*  146*  K  4.1  3.5  3.4*  CL  110  109  105  CO2  29  32  35*  BUN  22  29*  23  CREATININE  0.64  0.69  0.63  GLUCOSE  159*  187*  133*    Electrolytes Recent Labs     03/02/13  0530  03/03/13  0520  03/04/13  0515  CALCIUM  8.5  8.8  9.0  MG  1.8   --    --     Glucose Recent Labs     03/03/13  0752  03/03/13  1245  03/03/13  1553  03/03/13  1932  03/03/13  2341  03/04/13  0818  GLUCAP  162*  183*  131*  100*  187*  183*    Imaging Dg Chest Port 1 View  03/04/2013   *RADIOLOGY REPORT*  Clinical Data: Follow up atelectasis  PORTABLE CHEST - 1 VIEW  Comparison: Prior chest x-ray 03/03/2013  Findings: Interval extubation and removal of nasogastric tube. Left IJ approach central venous catheter remains in unchanged position with the tip in the distal left innominate vein. The patient is rotated toward the right.  Improving low lung volumes and bibasilar atelectasis.  The lungs are otherwise clear.  Stable mild cardiomegaly.  IMPRESSION:  1.   Improving inspiratory volumes and decreased bibasilar atelectasis. 2.  Interval extubation and removal of the nasogastric tube.   Original Report Authenticated By: Malachy Moan, M.D.   Dg Chest Port 1 View  03/03/2013   CLINICAL DATA:  Acute respiratory failure with hypoxia. Myocardial infarct.  EXAM: PORTABLE CHEST - 1 VIEW  COMPARISON:  03/02/2013  FINDINGS: Support apparatus remains in appropriate position. Low lung volumes again noted, however both lungs are grossly clear. Tiny left pleural effusion 1st pleural thickening noted. Heart size is normal.  IMPRESSION: Low lung volumes. No acute findings.   Electronically Signed   By: Myles Rosenthal   On: 03/03/2013 07:18      ASSESSMENT / PLAN:  PULMONARY A: Acute respiratory failure 2nd to acute pulmonary edema >> much improved. Acute PE >> very small; unsure this is cause of her respiratory distress. ?sleep disordered breathing >> difficulty tolerating BiPAP 9/04. P:   -oxygen as needed to keep SpO2 > 92% -goal even fluid balance >> continue lasix -continue heparin gtt until after cath >> then transition to oral anticoagulation -may need outpt assessment for sleep apnea  CARDIOVASCULAR A: Shock on admission >> ? Hypovolemic +/- sepsis.  Off pressors. Acute systolic heart failure >> ? Coronary ischemia with NSTEMI vs stress cardiomyopathy (more likely). Acute on chronic diastolic heart failure. Hx of hypertension, hyperlipidemia. P:  -continue ASA, lipitor, lopressor, lipitor -plan for cardiac cath if pt agreeable  RENAL A: Acute renal failure 2nd to hypovolemia, shock >> resolved. Non anion gap acidosis 2nd to diarrhea and renal failure >> resolved. Hypokalemia >> improved. Hypomagnesemia >> improved. Hypernatremia >> from diuresis. P:   -monitor renal fx, urine outpt -f/u and replace electrolytes as needed -continue scheduled KCL while getting lasix  GASTROINTESTINAL A:  N/V and diarrhea in setting of IBS >> diarrhea  improved. Heme + stools >> no overt bleeding. Nutrition. P:   -defer sigmoidoscopy until more stable -carb modified diet -protonix for SUP  HEMATOLOGIC A: Anemia. P:  -f/u CBC intermittently -transfuse for Hb < 8 with concern for ACS  INFECTIOUS A: Gastroenteritis P:   -completed Abx >> monitor clinically  ENDOCRINE A: Reported hx of hypothyroidism >> TSH 3.9 from 8/28. Relative adrenal insufficiency >> weaned off solu cortef. Hyperglycemia. P:   -SSI -check HbA1C  NEUROLOGIC A: Chronic pain, anxiety, fibromyalgia. Acute delirium while on ventilator >> much improved since extubation. Deconditioning. P:   -resumed lyrica 9/03 -prn tylenol, oxcodone, fentanyl -PT/OT evaluation  Code status >> full code  D/c CVL, foley, rectal tube.  Transfer to telemetry.  Transfer to [facility] for 9/06 and PCCM sign off.  Coralyn Helling, MD [facility] Pulmonary/Critical Care 03/04/2013, 9:09 AM Pager:  [phone] After 3pm call: [phone]

## 2013-03-04 NOTE — Progress Notes (Signed)
RT made several attempts to place Pt on BIPAP for sleep but patient didn't want to wear each time. Pt on [place] and tolerating well at this time, RT to monitor and assess as needed.

## 2013-03-04 NOTE — Progress Notes (Signed)
NUTRITION FOLLOW UP  Intervention:   Provide Ensure Pudding BID Provide Glucerna Shake once daily Provide Multivitamin with minerals daily  Nutrition Dx:   Inadequate oral intake related to inability to eat AEB npo status; discontinued  New Nutrition Dx: Predicted suboptimal energy intake related to recent extubation as evidenced by pt's report of wanting mostly pudding and italian ice.  Goal:   Enteral nutrition to provide 60-70% of estimated calorie needs (22-25 kcals/kg ideal body weight) and 100% of estimated protein needs, based on ASPEN guidelines for permissive underfeeding in critically ill obese individuals; discontinue  New Goal: Pt to meet >/= 90% of their estimated nutrition needs    Monitor:   PO intake Labs; low potassium, high BUN, low GFR, low hemoglobin Weight; 11 lbs below admission wt  Assessment:   Pt was extubated yesterday and diet has been advanced to Carb Modified. Pt reports having a fair appetite but, reports wanting to eat mostly pudding and italian ice due to sore throat. Pt had reported not eating 5 days PTA upon initial assessment. Pt's weight is 11 lbs below admission wt.  Reviewed foods that help prevent diarrhea and help form stool but, pt seems to have a hard time focusing at this time.   Height: Ht Readings from Last 1 Encounters:  02/25/13 5\' 2"  (1.575 m)    Weight Status:   Wt Readings from Last 1 Encounters:  03/04/13 179 lb 7.3 oz (81.4 kg)    Re-estimated needs:  Kcal: 1600-1800 Protein: 90-100 grams Fluid: >1.8 L  Skin: red peri area of buttocks, intact  Diet Order: Carb Control   Intake/Output Summary (Last 24 hours) at 03/04/13 1234 Last data filed at 03/04/13 1100  Gross per 24 hour  Intake  901.5 ml  Output   2815 ml  Net -1913.5 ml    Last BM: 9/4   Labs:   Recent Labs Lab 02/27/13 0415 02/28/13 0505 03/01/13 0500  03/02/13 0530 03/03/13 0520 03/04/13 0515  NA 136 139 143  < > 145 148* 146*  K 4.0  3.3* 2.8*  < > 4.1 3.5 3.4*  CL 112 108 108  < > 110 109 105  CO2 15* 19 24  < > 29 32 35*  BUN 21 25* 29*  < > 22 29* 23  CREATININE 0.92 0.86 0.84  < > 0.64 0.69 0.63  CALCIUM 8.2* 7.6* 8.3*  < > 8.5 8.8 9.0  MG 1.7 1.8 1.6  --  1.8  --   --   PHOS 2.2* 2.9 2.8  --   --   --   --   GLUCOSE 198* 167* 142*  < > 159* 187* 133*  < > = values in this interval not displayed.  CBG (last 3)   Recent Labs  03/03/13 2341 03/04/13 0818 03/04/13 1202  GLUCAP 187* 183* 121*    Scheduled Meds: . antiseptic oral rinse  15 mL Mouth Rinse QID  . aspirin  81 mg Oral Daily  . atorvastatin  10 mg Oral q1800  . chlorhexidine  15 mL Mouth Rinse BID  . furosemide  40 mg Oral Daily  . insulin aspart  0-20 Units Subcutaneous TID WC  . lisinopril  2.5 mg Oral Daily  . metoprolol tartrate  50 mg Oral BID  . multivitamin  5 mL Oral Daily  . pantoprazole  40 mg Oral Daily  . potassium chloride  40 mEq Oral Daily  . pregabalin  100 mg Oral BID  .  saccharomyces boulardii  250 mg Oral BID    Continuous Infusions: . dexmedetomidine 0.2 mcg/kg/hr (03/03/13 1300)  . heparin 1,050 Units/hr (03/04/13 0914)    Ian Malkin RD, LDN Inpatient Clinical Dietitian Pager: [phone] After Hours Pager: [phone]

## 2013-03-04 NOTE — Progress Notes (Signed)
ANTICOAGULATION CONSULT NOTE - Follow-up Consult  Pharmacy Consult for Heparin Indication: Pulmonary Embolism  No Known Allergies  Patient Measurements: Height: 5\' 2"  (157.5 cm) Weight: 179 lb 7.3 oz (81.4 kg) IBW/kg (Calculated) : 50.1 Heparin Dosing Weight: 70 kg  Vital Signs: Temp: 99 F (37.2 C) (09/05 0400) Temp src: Oral (09/05 0400) BP: 109/51 mmHg (09/05 0600) Pulse Rate: 95 (09/05 0600)  Labs:  Recent Labs  03/02/13 0530 03/02/13 1200 03/03/13 0520 03/04/13 0515  HGB 9.7*  --  9.6*  --   HCT 31.6*  --  31.4*  --   PLT 307  --  268  --   HEPARINUNFRC 0.61 0.48 0.67 0.63  CREATININE 0.64  --  0.69 0.63    Estimated Creatinine Clearance: 69.3 ml/min (by C-G formula based on Cr of 0.63).   Assessment: 33 yof admitted 8/27 with persistent hypotension, suspected gastroenteritis, acute renal failure and hypovolemic shock.  8/29 pt developed atypical chest pain with elevated troponins and respiratory distress requiring intubation.  RLL PE also noted on CT.  IV heparin started 8/30.    Heparin level 0.63, therapeutic this AM with IV heparin running at 1050 units/hr. Cardiology stopped IV heparin and started sq heparin. Pharmacy contacted MD and received verbal order to resume IV heparin for PE.  No interruption in IV heparin infusion noted. No bleeding. Hgb low but stable, plts wnl. Scr wnl for CG CrCl 69 ml/min.   Bilateral LE dopplers negative for DVT.   Possible cardiac catheterization Monday.   Goal of Therapy:  Heparin level 0.3-0.7 units/ml Monitor platelets by anticoagulation protocol: Yes   Plan:   Continue heparin rate at 1050 units/hr (=10.5 ml/hr).  Check heparin level and CBC daily while on heparin.    F/u long term anticoagulation after cath  Geoffry Paradise, PharmD, BCPS Pager: [phone] 8:32 AM Pharmacy #: [phone]

## 2013-03-04 NOTE — Progress Notes (Addendum)
    Subjective:  No chest pain or dyspnea. Feels 'sore all over.'  Objective:  Vital Signs in the last 24 hours: Temp:  [98.5 F (36.9 C)-99.7 F (37.6 C)] 99 F (37.2 C) (09/05 0400) Pulse Rate:  [66-112] 95 (09/05 0600) Resp:  [17-36] 36 (09/05 0600) BP: (101-133)/(35-65) 109/51 mmHg (09/05 0600) SpO2:  [98 %-100 %] 100 % (09/05 0600) FiO2 (%):  [30 %] 30 % (09/04 0817) Weight:  [179 lb 7.3 oz (81.4 kg)] 179 lb 7.3 oz (81.4 kg) (09/05 0400)  Intake/Output from previous day: 09/04 0701 - 09/05 0700 In: 1031.5 [P.O.:300; I.V.:701.5; NG/GT:30] Out: 2385 [Urine:2285; Stool:100]  Physical Exam: Pt is alert and oriented, NAD HEENT: normal Neck: JVP - normal Lungs: CTA bilaterally CV: RRR without murmur or gallop Abd: soft, NT, Positive BS, no hepatomegaly Ext: no C/C/E, distal pulses intact and equal Skin: warm/dry no rash   Lab Results:  Recent Labs  03/02/13 0530 03/03/13 0520  WBC 17.5* 13.3*  HGB 9.7* 9.6*  PLT 307 268    Recent Labs  03/03/13 0520 03/04/13 0515  NA 148* 146*  K 3.5 3.4*  CL 109 105  CO2 32 35*  GLUCOSE 187* 133*  BUN 29* 23  CREATININE 0.69 0.63   No results found for this basename: TROPONINI, CK, MB,  in the last 72 hours  Tele: Sinus rhythm/sinus tach  Assessment/Plan:  1. NSTEMI 2. VDRF - resolved 3. Acute systolic heart failure, resolved  Pt markedly improved. I discussed indication for cardiac and possible PCI. I reviewed risks and alternatives. She would like to think this over and discuss with her husband. She understands diff dx for NSTEMI under these circumstances includes both stress cardiomyopathy (acute Takotsubo's) and obstructive CAD. Cath is needed to distinguish this. Will tentatively plan on cath for Monday if she is agreeable. I would stop heparin today and change to DVT-prophylaxis dose, and will add low-dose ACE-I. Our team will follow-up over the weekend to see what she has decided about cath.   Tonny Bollman, M.D. 03/04/2013, 7:58 AM  Addendum: spoke with PharmD. Pt had small PE so suspect whe will need to stay on heparin and then transition to an oral anticoagulant after cath. Will not change to DVT-proph dose as mentioned above.  Tonny Bollman 03/04/2013 8:22 AM

## 2013-03-04 NOTE — Progress Notes (Signed)
CSW received referral.  CSW discussed with RN who reports that pt is confused at this time and may need SNF placement.  Awaiting PT/OT evaluations to determine pt disposition needs.  CSW to follow up with PT/OT recommendations and complete full psychosocial assessment at that time.  Jacklynn Lewis, MSW, LCSWA  Clinical Social Work (Coverage for [place], [place] [phone])

## 2013-03-05 LAB — CBC
HCT: 34.5 % — ABNORMAL LOW (ref 36.0–46.0)
Hemoglobin: 10.2 g/dL — ABNORMAL LOW (ref 12.0–15.0)
MCHC: 29.6 g/dL — ABNORMAL LOW (ref 30.0–36.0)
RDW: 16.7 % — ABNORMAL HIGH (ref 11.5–15.5)
WBC: 16.4 10*3/uL — ABNORMAL HIGH (ref 4.0–10.5)

## 2013-03-05 LAB — GLUCOSE, CAPILLARY
Glucose-Capillary: 120 mg/dL — ABNORMAL HIGH (ref 70–99)
Glucose-Capillary: 140 mg/dL — ABNORMAL HIGH (ref 70–99)

## 2013-03-05 LAB — BASIC METABOLIC PANEL
BUN: 18 mg/dL (ref 6–23)
CO2: 36 mEq/L — ABNORMAL HIGH (ref 19–32)
Chloride: 100 mEq/L (ref 96–112)
GFR calc non Af Amer: >90 mL/min (ref >90)
Glucose, Bld: 166 mg/dL — ABNORMAL HIGH (ref 70–99)
Potassium: 3.5 mEq/L (ref 3.5–5.1)
Sodium: 143 mEq/L (ref 135–145)

## 2013-03-05 LAB — HEPARIN LEVEL (UNFRACTIONATED): Heparin Unfractionated: 0.47 IU/mL (ref 0.30–0.70)

## 2013-03-05 LAB — HEMOGLOBIN A1C: Mean Plasma Glucose: 128 mg/dL — ABNORMAL HIGH (ref <117)

## 2013-03-05 NOTE — Progress Notes (Signed)
Visit to [name] on referral from [facility].   Chaplain was told that [name] still has side effects from coming off a vent and the medications related to that procedure. This accounts for her confusion and disorientation of days and hours. She relates she cannot get her phone to work and asked twice for the chaplain to assist in this effort. Information past on to medical staff.  [name] is anxious regarding the absence of her husband at such a late morning hour. She feels isolated and alone without him being in the room with her. Chaplain left request that should [name] need a visit should her husband be further delayed, to page the chaplain.  Prayer and comfort given.  Recommend further Spiritual Care visits during rounding.  Benjie Karvonen. Kamoria Lucien, DMin Chaplain

## 2013-03-05 NOTE — Progress Notes (Signed)
ANTICOAGULATION CONSULT NOTE - Follow-up Consult  Pharmacy Consult for Heparin Indication: Pulmonary Embolism  No Known Allergies  Patient Measurements: Height: 5\' 2"  (157.5 cm) Weight: 181 lb 14.1 oz (82.5 kg) IBW/kg (Calculated) : 50.1 Heparin Dosing Weight: 70 kg  Vital Signs: Temp: 98.2 F (36.8 C) (09/06 0501) Temp src: Oral (09/06 0501) BP: 132/52 mmHg (09/06 0501) Pulse Rate: 88 (09/06 0501)  Labs:  Recent Labs  03/03/13 0520 03/04/13 0515 03/05/13 0425  HGB 9.6*  --  10.2*  HCT 31.4*  --  34.5*  PLT 268  --  339  HEPARINUNFRC 0.67 0.63 0.47  CREATININE 0.69 0.63 0.57    Estimated Creatinine Clearance: 69.8 ml/min (by C-G formula based on Cr of 0.57).   Assessment: 51 yof admitted 8/27 with persistent hypotension, suspected gastroenteritis, acute renal failure and hypovolemic shock.  8/29 pt developed atypical chest pain with elevated troponins and respiratory distress requiring intubation.  RLL PE also noted on CT.  IV heparin started 8/30.    Heparin level 0.47, therapeutic this AM with IV heparin running at 1050 units/hr. No bleeding. Hgb low but stable, plts wnl. Scr wnl for CG CrCl 69 ml/min. Bilateral LE dopplers negative for DVT  Possible cardiac catheterization planned for Monday.   Goal of Therapy:  Heparin level 0.3-0.7 units/ml Monitor platelets by anticoagulation protocol: Yes   Plan:   Continue heparin rate at 1050 units/hr (=10.5 ml/hr).  Check heparin level and CBC daily while on heparin.    F/u long term anticoagulation after cath  Geoffry Paradise, PharmD, BCPS Pager: [phone] 8:13 AM Pharmacy #: [phone]

## 2013-03-05 NOTE — Progress Notes (Addendum)
[facility] PROGRESS NOTE  [name] ZOX:[id number] DOB: 03/05/1946 DOA: 02/23/2013 PCP: Pcp Not In System  Brief history 67 year old female with hypertension, fibromyalgia, diverticulitis presented to the ED with three-day history of worsening abdominal pain, nausea, vomiting, diarrhea, and hematochezia. The patient was noted to be hypotensive with a blood pressure of 70's/30's.  PCCM was consulted and the patient was admitted to the ICU. The patient was placed on vasopressors and given intravenous fluids. Gastroenterology was consulted and recommended a CT of the abdomen and pelvis in addition to possibly an unprepped colonoscopy and upper endoscopy. CT of the abdomen and pelvis revealed a dilated gallbladder without any wall thickening or dilated ducts. On 02/25/2013, the patient developed chest pain and left arm pain. Her troponin was noted to be elevated and she developed acute respiratory failure with pulmonary edema. Her endoscopy/sigmoidoscopy was canceled. 02/26/2013 CT angiography chest revealed right lower lobe pulmonary embolus. The patient was started on heparin drip. Cardiology was consulted and was concerned about NSTEMI vs Takotsubo's cardiomyopathy. 02/27/2003 the echocardiogram showed severe hypokinesis of the distal anterolateral septum with grade 3 diastolic dysfunction. The patient was started on intravenous furosemide and diuresed. The patient was extubated on 03/03/2013. The patient was transitioned to the medical floor on 03/04/2013. Currently cardiology plans for cardiac catheterization to clarify obstructive coronary versus toxicity of his cardiomyopathy.  Assessment/Plan: Acute respiratory failure -Multifactorial including pulmonary edema and pulmonary embolus -Currently stable on room air Pulmonary embolus -Continue heparin drip -02/26/2013 CT and regular the chest revealed right lower lobe pulmonary embolus with bilateral pleural effusions and mild pulmonary  edema Acute systolic and diastolic CHF -02/27/2003 Echo--EF not mentioned the patient had severe HK of mid-to distal anterolateral septum and anterior myocardium. There was akinesis of the inferior septal myocardium, moderate HK of apex, grade 3 diastolic dysfunction -Cardiology is following -Cardiac catheterization planned 03/07/2013 -Continue aspirin, furosemide, Lipitor - continue oral furosemide--maintain even balance I/O -neg 7liters for the admission NSTEMI -Heparin drip continued in the setting of pulmonary embolus -Will transition to oral anticoagulation after cardiac catheterization -Continue lisinopril Acute kidney injury -Resolved -Secondary to hypotension and hypovolemia Diarrhea -Patient has chronic loose stools in the setting of IBS -Stool pathogen panel negative, C. difficile PCR negative Hypothyroidism -Continue Synthroid -TSH 3.945 Fibromyalgia -Continue Lyrica  Family Communication:   Updated husband on telephone Disposition Plan:   Home when medically stable          Procedures/Studies: Ct Abdomen Pelvis Wo Contrast  02/23/2013   *RADIOLOGY REPORT*  Clinical Data: Diarrhea and abdominal pain  CT ABDOMEN AND PELVIS WITHOUT CONTRAST  Technique:  Multidetector CT imaging of the abdomen and pelvis was performed following the standard protocol without intravenous contrast.  Comparison: 10/14/2012  Findings: The lung bases are free of acute infiltrate or sizable effusion.  The liver and spleen are stable in appearance from prior exam.  The gallbladder is significantly dilated however no pericholecystic fluid or wall thickening is noted.  No definitive cholelithiasis is seen.  No extrahepatic biliary ductal dilatation is noted.  The adrenal glands and pancreas are within normal limits.  The kidneys are well visualized bilaterally.  No renal calculi or obstructive changes are noted.  A Foley catheter is decompressed the bladder.  The appendix is not well visualized may  been surgically removed.  Postsurgical changes in the stomach are again seen.  IMPRESSION: Dilated gallbladder although no wall thickening or pericholecystic fluid is noted.  No cholelithiasis is seen.  No acute abnormality is noted.  Original Report Authenticated By: Alcide Clever, M.D.   Dg Abd 1 View  02/26/2013   *RADIOLOGY REPORT*  Clinical Data: Status post nasogastric catheter placement  ABDOMEN - 1 VIEW  Comparison: 02/23/2013  Findings: A nasogastric catheter is now noted within the mid stomach.  Scattered large and small bowel gas is noted.  No definitive obstructive changes are seen.  The osseous structures are stable.  IMPRESSION: Nasogastric catheter within the stomach.   Original Report Authenticated By: Alcide Clever, M.D.   Dg Abd 1 View  02/23/2013   *RADIOLOGY REPORT*  Clinical Data: Abdominal pain, weakness  ABDOMEN - 1 VIEW  Comparison: CT abdomen pelvis - 10/14/2012  Findings:  There is mild nonspecific gaseous distension of a loop of small bowel within the midhemiabdomen measuring approximately 4.3 cm in diameter.  This finding is associated with a paucity of distal colonic gas.  No supine evidence of pneumoperitoneum.  No definite pneumatosis or portal venous gas.  No definite abnormal intra-abdominal calcifications.  Multilevel thoracolumbar spine degenerative change suspected.  IMPRESSION: Mild gaseous distension of a loop of small bowel left mid hemiabdomen, nonspecific but could be seen in the setting of early small bowel obstruction.  Clinical correlation is advised.   Original Report Authenticated By: Tacey Ruiz, MD   Ct Angio Chest Pe W/cm &/or Wo Cm  02/26/2013   *RADIOLOGY REPORT*  Clinical Data: Short of breath.  Rule out pulmonary embolism. Respiratory distress  CT ANGIOGRAPHY CHEST  Technique:  Multidetector CT imaging of the chest using the standard protocol during bolus administration of intravenous contrast. Multiplanar reconstructed images including MIPs were obtained  and reviewed to evaluate the vascular anatomy.  Contrast: OMNIPAQUE IOHEXOL 350 MG/ML SOLN  Comparison: Chest x-ray 02/25/2013  Findings: Image quality limited by motion.  The patient was not able to hold still.  The patient is intubated.  There are bilateral pleural effusions and bibasilar dependent atelectasis.  Small filling defect is present in the pulmonary artery supplying the superior segment of the right lower lobe.  This is best seen on axial thin sections and is seen on multiple images.  No other pulmonary emboli are identified.  Coronary calcification.  Heart size within normal limits.  Mild airspace disease bilaterally consistent with residual pulmonary edema.  No acute bony change.  IMPRESSION: Small segmental pulmonary embolism to the superior segment right lower lobe.  Bilateral effusions and bibasilar atelectasis.  Mild pulmonary edema.  Critical Value/emergent results were called by telephone at the time of interpretation on 02/26/2013 at 1223 hours to Dr. Maple Hudson, who verbally acknowledged these results.   Original Report Authenticated By: Janeece Riggers, M.D.   US Abdomen Port  02/24/2013   *RADIOLOGY REPORT*  Clinical Data:  Markedly distended gallbladder on CT scan of 02/23/2013. Bilirubin is not elevated.  COMPLETE ABDOMINAL ULTRASOUND  Comparison:  CT scan dated 02/23/2013 and ultrasound dated 10/16/2012  Findings:  Gallbladder:  No gallstones, gallbladder wall thickening, or pericholecystic fluid. The gallbladder is distended.  Negative sonographic Murphy's sign.  Common bile duct:  Common bile duct is 11 mm in diameter, unchanged since 10/16/2012.  No dilated intrahepatic bile ducts.  Liver:  No focal lesion identified.  Within normal limits in parenchymal echogenicity.  IVC:  Normal.  Pancreas:  Head and body of the pancreas are normal.  Tail is obscured by bowel.  Spleen:  Normal.  Formal 5 cm in length.  Right Kidney:  Normal.  10.8 cm in length.  Left Kidney:  Normal.  10.0 cm in  length.  Abdominal aorta:  Normal.  2.1 cm in diameter.  IMPRESSION: The gallbladder is distended but there are no stones and the wall is not thickened. The gallbladder was distended on the prior ultrasound of 10/16/2012 but not to quite the same degree. There is chronic dilatation of the common bile duct but there are no dilated intrahepatic bile ducts.  The patient's bilirubin is not elevated.   Original Report Authenticated By: Francene Boyers, M.D.   Dg Chest Port 1 View  03/04/2013   *RADIOLOGY REPORT*  Clinical Data: Follow up atelectasis  PORTABLE CHEST - 1 VIEW  Comparison: Prior chest x-ray 03/03/2013  Findings: Interval extubation and removal of nasogastric tube. Left IJ approach central venous catheter remains in unchanged position with the tip in the distal left innominate vein. The patient is rotated toward the right.  Improving low lung volumes and bibasilar atelectasis.  The lungs are otherwise clear.  Stable mild cardiomegaly.  IMPRESSION:  1.  Improving inspiratory volumes and decreased bibasilar atelectasis. 2.  Interval extubation and removal of the nasogastric tube.   Original Report Authenticated By: Malachy Moan, M.D.   Dg Chest Port 1 View  03/03/2013   CLINICAL DATA:  Acute respiratory failure with hypoxia. Myocardial infarct.  EXAM: PORTABLE CHEST - 1 VIEW  COMPARISON:  03/02/2013  FINDINGS: Support apparatus remains in appropriate position. Low lung volumes again noted, however both lungs are grossly clear. Tiny left pleural effusion 1st pleural thickening noted. Heart size is normal.  IMPRESSION: Low lung volumes. No acute findings.   Electronically Signed   By: Myles Rosenthal   On: 03/03/2013 07:18   Dg Chest Port 1 View  03/02/2013   *RADIOLOGY REPORT*  Clinical Data: CHF  PORTABLE CHEST - 1 VIEW  Comparison: Prior radiograph from 03/01/2013  Located 4.9 cm above the carina.  Enteric tube courses into the abdomen.  Cardiomegaly not significantly changed.  Allowing for patient  rotation, diffuse prominence of the interstitial markings and pulmonary vascular congestion is grossly similar as compared to the prior exam. A left pleural effusion is slightly improved.  Dense retrocardiac left lower lobe opacity persists, likely atelectasis and / or effusion.  No pneumothorax.  Osseous structures are unchanged.  IMPRESSION: 1. Tip of the endotracheal tube 4.9 cm above the carina.  Remaining support apparatus in stable position. 2.  Persistent pulmonary edema with slightly improved left pleural effusions. 3.  Persistent dense retrocardiac left lower lobe opacity, likely a combination of atelectasis and effusion.   Original Report Authenticated By: Rise Mu, M.D.   Dg Chest Port 1 View  03/01/2013   *RADIOLOGY REPORT*  Clinical Data: Check ETT  PORTABLE CHEST - 1 VIEW  Comparison: 02/28/2013  Findings: Left basilar opacity, likely a combination of atelectasis and small pleural effusion.  Mild pulmonary vascular congestion without frank interstitial edema.  Mild cardiomegaly.  Endotracheal tube terminates 4.5 cm above the carina.  Enteric tube courses below the diaphragm.  Left IJ venous catheter terminates in the left brachiocephalic vein.  IMPRESSION: Endotracheal tube terminates 4.5 cm above the carina.  Left basilar opacity, likely a combination of atelectasis and small pleural effusion.  Additional support apparatus as above.   Original Report Authenticated By: Charline Bills, M.D.   Dg Chest Port 1 View  02/28/2013   *RADIOLOGY REPORT*  Clinical Data: Respiratory failure  PORTABLE CHEST - 1 VIEW  Comparison: 02/27/2013  Findings: The cardiac shadow is stable.  An endotracheal tube, left jugular central  line and nasogastric catheter are all stable in appearance.  Mild left basilar atelectasis is noted.  Mild central vascular congestion is noted as well.  The overall appearance however is improved in the interval from prior exam.  IMPRESSION: Improved vascular congestion.   Persistent left basilar changes are seen.   Original Report Authenticated By: Alcide Clever, M.D.   Dg Chest Port 1 View  02/27/2013   *RADIOLOGY REPORT*  Clinical Data: 67 year old female with respiratory failure - endotracheal tube retracted.  PORTABLE CHEST - 1 VIEW  Comparison: 02/27/2013  Findings: The endotracheal tube has been slightly retracted with tip now 2.1 cm above the carina - satisfactory. An NG tube entering the stomach and left IJ central venous catheter with tip overlying the medial left brachiocephalic vein again identified. Pulmonary vascular congestion, left upper and lower lobe atelectasis, mild pulmonary edema and probable small effusions again noted. There is no evidence of pneumothorax.  IMPRESSION: Slight interval retraction of endotracheal tube now with tip in satisfactory position.  Continued scattered areas of left lung atelectasis, pulmonary vascular congestion/edema and effusions.   Original Report Authenticated By: Harmon Pier, M.D.   Dg Chest Port 1 View  02/27/2013   *RADIOLOGY REPORT*  Clinical Data: Evaluate endotracheal tube placement.  PORTABLE CHEST - 1 VIEW  Comparison: Chest x-ray 02/25/2013.  Findings: Endotracheal tube is in the low position with tip at the carina directed slightly toward the left mainstem bronchus. There is a left-sided internal jugular central venous catheter with tip terminating in the proximal superior vena cava. A nasogastric tube is seen extending into the stomach, however, the tip of the nasogastric tube extends below the lower margin of the image.  The lung volumes are low.  Worsening opacity in the left base, favored to reflect left lower lobe atelectasis, although some underlying air space consolidation is not excluded.  Possible small left pleural effusion. There is cephalization of the pulmonary vasculature and slight indistinctness of the interstitial markings suggestive of mild pulmonary edema.  Heart size is borderline large.  Upper  mediastinal contours are within normal limits.  IMPRESSION: 1.  Support apparatus, as above.  Low position of endotracheal tube which is at the carina directed toward the left mainstem bronchus. This should be withdrawn 4 cm for more optimal placement. 2.  Interval development of probable left lower lobe atelectasis. 3.  Possible small left pleural effusion.  Mild interstitial pulmonary edema.  Critical Value/emergent results were called by telephone at the time of interpretation on 02/27/2013 at 07:45 a.m. to nurse Marylene Land (for Dr. Marchelle Gearing), who verbally acknowledged these results.   Original Report Authenticated By: Trudie Reed, M.D.   Dg Chest Port 1 View  02/26/2013   CLINICAL DATA:  Intubation.  EXAM: PORTABLE CHEST - 1 VIEW  COMPARISON:  02/23/2013  FINDINGS: Left central line remains in stable position. Endotracheal tube has been placed and is 2 cm above the carinal. Bilateral perihilar airspace opacities, slightly more pronounced in the upper lobes. This could represent edema, possibly negative pressure edema. The heart is normal size. No effusions. No acute bony abnormality.  IMPRESSION: Endotracheal tube 2 cm above the carinal.  New perihilar and upper lobe opacities, question edema.   Electronically Signed   By: Charlett Nose   On: 02/26/2013 00:06   Dg Chest Portable 1 View  02/23/2013   *RADIOLOGY REPORT*  Clinical Data: Neck pain.  Diarrhea.  Abdominal pain.  Rectal bleeding.  PORTABLE CHEST - 1 VIEW  Comparison: Chest x-ray 03/29/2009.  Findings: There is a left-sided internal jugular central venous catheter with tip terminating in the proximal superior vena cava. Lung volumes are slightly low.  No acute consolidative air space disease.  No pleural effusions.  No evidence of pulmonary edema. Heart size is within normal limits.  Upper mediastinal contours are within normal limits for the portable supine technique.  No pneumothorax.  IMPRESSION: 1.  No radiographic evidence of acute  cardiopulmonary disease. 2.  Left internal jugular central venous catheter with tip in the proximal superior vena cava.   Original Report Authenticated By: Trudie Reed, M.D.         Subjective: Patient complains of pain all over. In particular, she complains of chest discomfort without any shortness of breath. She states that she is "congested" denies any nausea, vomiting, diarrhea, abdominal pain, dysuria. She has not had a bowel movement in 2 days.  Objective: Filed Vitals:   03/04/13 1419 03/04/13 1700 03/04/13 2135 03/05/13 0501  BP: 122/53  130/49 132/52  Pulse: 93  92 88  Temp: 98.1 F (36.7 C)  98.7 F (37.1 C) 98.2 F (36.8 C)  TempSrc: Oral  Oral Oral  Resp: 20  20 24   Height:  5\' 2"  (1.575 m)    Weight:    82.5 kg (181 lb 14.1 oz)  SpO2: 95%  94% 97%    Intake/Output Summary (Last 24 hours) at 03/05/13 0710 Last data filed at 03/05/13 0509  Gross per 24 hour  Intake    144 ml  Output    900 ml  Net   -756 ml   Weight change: 1.1 kg (2 lb 6.8 oz) Exam:   General:  Pt is alert, follows commands appropriately, not in acute distress  HEENT: No icterus, No thrush,, [place]/AT  Cardiovascular: RRR, S1/S2, no rubs, no gallops  Respiratory: Bibasilar crackles. No wheezes. Good air movement.  Abdomen: Soft/+BS, non tender, non distended, no guarding  Extremities: 1+ edema, No lymphangitis, No petechiae, No rashes, no synovitis  Data Reviewed: Basic Metabolic Panel:  Recent Labs Lab 02/27/13 0415 02/28/13 0505 03/01/13 0500 03/01/13 1345 03/02/13 0530 03/03/13 0520 03/04/13 0515 03/05/13 0425  NA 136 139 143 142 145 148* 146* 143  K 4.0 3.3* 2.8* 4.3 4.1 3.5 3.4* 3.5  CL 112 108 108 107 110 109 105 100  CO2 15* 19 24 24 29  32 35* 36*  GLUCOSE 198* 167* 142* 162* 159* 187* 133* 166*  BUN 21 25* 29* 28* 22 29* 23 18  CREATININE 0.92 0.86 0.84 0.77 0.64 0.69 0.63 0.57  CALCIUM 8.2* 7.6* 8.3* 8.6 8.5 8.8 9.0 9.2  MG 1.7 1.8 1.6  --  1.8  --   --   --    PHOS 2.2* 2.9 2.8  --   --   --   --   --    Liver Function Tests: No results found for this basename: AST, ALT, ALKPHOS, BILITOT, PROT, ALBUMIN,  in the last 168 hours No results found for this basename: LIPASE, AMYLASE,  in the last 168 hours No results found for this basename: AMMONIA,  in the last 168 hours CBC:  Recent Labs Lab 02/27/13 0415 02/28/13 0505 03/01/13 0500 03/02/13 0530 03/03/13 0520 03/05/13 0425  WBC 11.5* 15.1* 17.1* 17.5* 13.3* 16.4*  NEUTROABS 8.5* 11.7* 12.3* 13.4*  --   --   HGB 9.2* 9.2* 9.6* 9.7* 9.6* 10.2*  HCT 28.2* 28.2* 29.5* 31.6* 31.4* 34.5*  MCV 84.7 83.7 84.8 88.0 89.5 91.0  PLT  257 267 303 307 268 339   Cardiac Enzymes:  Recent Labs Lab 02/26/13 0940 03/01/13 0500  CKTOTAL  --  210*  TROPONINI 1.92* <0.30   BNP: No components found with this basename: POCBNP,  CBG:  Recent Labs Lab 03/03/13 2341 03/04/13 0818 03/04/13 1202 03/04/13 1719 03/04/13 2141  GLUCAP 187* 183* 121* 192* 104*    Recent Results (from the past 240 hour(s))  CULTURE, BLOOD (ROUTINE X 2)     Status: None   Collection Time    02/23/13  2:40 PM      Result Value Range Status   Specimen Description BLOOD LEFT ANTECUBITAL   Final   Special Requests BOTTLES DRAWN AEROBIC AND ANAEROBIC 5CC   Final   Culture  Setup Time     Final   Value: 02/23/2013 22:45     Performed at [facility]   Culture     Final   Value: NO GROWTH 5 DAYS     Performed at [facility]   Report Status 03/01/2013 FINAL   Final  CULTURE, BLOOD (ROUTINE X 2)     Status: None   Collection Time    02/23/13  3:30 PM      Result Value Range Status   Specimen Description BLOOD LEFT NECK   Final   Special Requests BOTTLES DRAWN AEROBIC AND ANAEROBIC 10CC   Final   Culture  Setup Time     Final   Value: 02/23/2013 22:46     Performed at [facility]   Culture     Final   Value: NO GROWTH 5 DAYS     Performed at [facility]   Report Status 03/01/2013  FINAL   Final  URINE CULTURE     Status: None   Collection Time    02/23/13  3:40 PM      Result Value Range Status   Specimen Description URINE, CATHETERIZED   Final   Special Requests NONE   Final   Culture  Setup Time     Final   Value: 02/23/2013 22:51     Performed at [facility] Count     Final   Value: NO GROWTH     Performed at [facility]   Culture     Final   Value: NO GROWTH     Performed at [facility]   Report Status 02/24/2013 FINAL   Final  MRSA PCR SCREENING     Status: None   Collection Time    02/23/13  9:17 PM      Result Value Range Status   MRSA by PCR NEGATIVE  NEGATIVE Final   Comment:            The GeneXpert MRSA Assay (FDA     approved for NASAL specimens     only), is one component of a     comprehensive MRSA colonization     surveillance program. It is not     intended to diagnose MRSA     infection nor to guide or     monitor treatment for     MRSA infections.  STOOL CULTURE     Status: None   Collection Time    02/24/13  5:58 AM      Result Value Range Status   Specimen Description STOOL   Final   Special Requests Normal   Final   Culture     Final  Value: NO SALMONELLA, SHIGELLA, CAMPYLOBACTER, YERSINIA, OR E.COLI 0157:H7 ISOLATED     Performed at [facility]   Report Status 02/28/2013 FINAL   Final  CLOSTRIDIUM DIFFICILE BY PCR     Status: None   Collection Time    02/24/13  5:58 AM      Result Value Range Status   C difficile by pcr NEGATIVE  NEGATIVE Final   Comment: Performed at [facility]  STOOL CULTURE     Status: None   Collection Time    02/27/13 10:18 AM      Result Value Range Status   Specimen Description STOOL   Final   Special Requests NONE   Final   Culture     Final   Value: NO SALMONELLA, SHIGELLA, CAMPYLOBACTER, YERSINIA, OR E.COLI 0157:H7 ISOLATED     Note: REDUCED NORMAL FLORA PRESENT     Performed at [facility]   Report Status 03/03/2013 FINAL    Final     Scheduled Meds: . antiseptic oral rinse  15 mL Mouth Rinse QID  . aspirin  81 mg Oral Daily  . atorvastatin  10 mg Oral q1800  . chlorhexidine  15 mL Mouth Rinse BID  . feeding supplement  1 Container Oral BID PC  . feeding supplement  237 mL Oral Q24H  . furosemide  40 mg Oral Daily  . insulin aspart  0-20 Units Subcutaneous TID WC  . lisinopril  2.5 mg Oral Daily  . metoprolol tartrate  50 mg Oral BID  . multivitamin with minerals  1 tablet Oral Daily  . pantoprazole  40 mg Oral Daily  . potassium chloride  40 mEq Oral Daily  . pregabalin  100 mg Oral BID  . saccharomyces boulardii  250 mg Oral BID   Continuous Infusions: . dexmedetomidine 0.2 mcg/kg/hr (03/03/13 1300)  . heparin 1,050 Units/hr (03/05/13 0600)     Brantlee Penn, DO  [facility] Pager [phone]  If 7PM-7AM, please contact night-coverage [email or web address] Password [email or web address] 03/05/2013, 7:10 AM   LOS: 10 days

## 2013-03-05 NOTE — Progress Notes (Signed)
Subjective:  Laying flat, no CP. "Has congestion" but no SOB   Objective:  Vital Signs in the last 24 hours: Temp:  [98.1 F (36.7 C)-98.7 F (37.1 C)] 98.2 F (36.8 C) (09/06 0501) Pulse Rate:  [86-93] 88 (09/06 0501) Resp:  [20-24] 24 (09/06 0501) BP: (122-132)/(49-74) 132/52 mmHg (09/06 0501) SpO2:  [93 %-97 %] 97 % (09/06 0501) Weight:  [82.5 kg (181 lb 14.1 oz)] 82.5 kg (181 lb 14.1 oz) (09/06 0501)  Intake/Output from previous day: 09/05 0701 - 09/06 0700 In: 144 [P.O.:60; I.V.:84] Out: 900 [Urine:900]   Physical Exam: General: Well developed, well nourished, in no acute distress. Head:  Normocephalic and atraumatic. Lungs: Clear to auscultation and percussion. Heart: Normal S1 and S2.  No murmur, rubs or gallops.  Abdomen: soft, non-tender, positive bowel sounds. Extremities: No clubbing or cyanosis. No edema. Neurologic: Alert and oriented x 3.    Lab Results:  Recent Labs  03/03/13 0520 03/05/13 0425  WBC 13.3* 16.4*  HGB 9.6* 10.2*  PLT 268 339    Recent Labs  03/04/13 0515 03/05/13 0425  NA 146* 143  K 3.4* 3.5  CL 105 100  CO2 35* 36*  GLUCOSE 133* 166*  BUN 23 18  CREATININE 0.63 0.57   No results found for this basename: TROPONINI, CK, MB,  in the last 72 hours Hepatic Function Panel No results found for this basename: PROT, ALBUMIN, AST, ALT, ALKPHOS, BILITOT, BILIDIR, IBILI,  in the last 72 hours No results found for this basename: CHOL,  in the last 72 hours No results found for this basename: PROTIME,  in the last 72 hours  Imaging: Dg Chest Port 1 View  03/04/2013   *RADIOLOGY REPORT*  Clinical Data: Follow up atelectasis  PORTABLE CHEST - 1 VIEW  Comparison: Prior chest x-ray 03/03/2013  Findings: Interval extubation and removal of nasogastric tube. Left IJ approach central venous catheter remains in unchanged position with the tip in the distal left innominate vein. The patient is rotated toward the right.  Improving low lung volumes  and bibasilar atelectasis.  The lungs are otherwise clear.  Stable mild cardiomegaly.  IMPRESSION:  1.  Improving inspiratory volumes and decreased bibasilar atelectasis. 2.  Interval extubation and removal of the nasogastric tube.   Original Report Authenticated By: Malachy Moan, M.D.   Personally viewed.   Telemetry: No adverse rhythm Personally viewed.   EKG:  NSR, NSSTW flattening  Cardiac Studies:  Cardiomyopathy -ECHO  Assessment/Plan:  Acute respiratory failure with hypoxia  - resolved  - WBC mildly increased  NSTEMI (non-ST elevated myocardial infarction)  - cardiomyopathy. ?Stress induced vs obstructive CAD  - patient is amenable to cath.   - Cath likely Monday  Shock circulatory  - resolved  Pulmonary embolism  -IV heparin  - will need long term anticoag  - small PE  Cardiomyopathy/ acute systolic HF  - on liquid metoprolol - ? Pill form  - low dose ace  - titrate as we can  - low dose lasix  - monitor K  Anemia  - mild      SKAINS, MARK 03/05/2013, 8:23 AM

## 2013-03-06 LAB — BASIC METABOLIC PANEL
BUN: 11 mg/dL (ref 6–23)
Calcium: 8.5 mg/dL (ref 8.4–10.5)
GFR calc Af Amer: >90 mL/min (ref >90)
GFR calc non Af Amer: >90 mL/min (ref >90)
Glucose, Bld: 121 mg/dL — ABNORMAL HIGH (ref 70–99)

## 2013-03-06 LAB — GLUCOSE, CAPILLARY
Glucose-Capillary: 151 mg/dL — ABNORMAL HIGH (ref 70–99)
Glucose-Capillary: 141 mg/dL — ABNORMAL HIGH (ref 70–99)
Glucose-Capillary: 113 mg/dL — ABNORMAL HIGH (ref 70–99)
Glucose-Capillary: 97 mg/dL (ref 70–99)

## 2013-03-06 LAB — HEPARIN LEVEL (UNFRACTIONATED): Heparin Unfractionated: 0.37 IU/mL (ref 0.30–0.70)

## 2013-03-06 LAB — CBC
HCT: 31.7 % — ABNORMAL LOW (ref 36.0–46.0)
Hemoglobin: 9.6 g/dL — ABNORMAL LOW (ref 12.0–15.0)
MCH: 27 pg (ref 26.0–34.0)
MCHC: 30.3 g/dL (ref 30.0–36.0)
RDW: 16.1 % — ABNORMAL HIGH (ref 11.5–15.5)

## 2013-03-06 MED ORDER — SODIUM CHLORIDE 0.9 % IV SOLN: INTRAVENOUS | Status: DC

## 2013-03-06 MED ORDER — CALCIUM CARBONATE ANTACID 500 MG PO CHEW
200.0000 mg | CHEWABLE_TABLET | Freq: Three times a day (TID) | ORAL | Status: DC | PRN
  Administered 2013-03-06: 200 mg via ORAL
  Filled 2013-03-06: qty 1

## 2013-03-06 MED ORDER — FUROSEMIDE 40 MG PO TABS
40.0000 mg | ORAL_TABLET | Freq: Two times a day (BID) | ORAL | Status: DC
  Administered 2013-03-06 – 2013-03-08 (×3): 40 mg via ORAL
  Filled 2013-03-06 (×6): qty 1

## 2013-03-06 MED ORDER — SODIUM CHLORIDE 0.9 % IJ SOLN
3.0000 mL | Freq: Two times a day (BID) | INTRAMUSCULAR | Status: DC
  Administered 2013-03-06: 10:00:00 via INTRAVENOUS
  Administered 2013-03-06: 3 mL via INTRAVENOUS

## 2013-03-06 MED ORDER — SODIUM CHLORIDE 0.9 % IJ SOLN: 3.0000 mL | INTRAMUSCULAR | Status: DC | PRN

## 2013-03-06 MED ORDER — ASPIRIN 81 MG PO CHEW
324.0000 mg | CHEWABLE_TABLET | ORAL | Status: AC
  Administered 2013-03-07: 324 mg via ORAL
  Filled 2013-03-06: qty 4

## 2013-03-06 MED ORDER — DIAZEPAM 5 MG PO TABS
5.0000 mg | ORAL_TABLET | ORAL | Status: AC
  Administered 2013-03-07: 5 mg via ORAL
  Filled 2013-03-06: qty 1

## 2013-03-06 MED ORDER — SODIUM CHLORIDE 0.9 % IV SOLN
250.0000 mL | INTRAVENOUS | Status: DC | PRN
  Administered 2013-03-07: 01:00:00 250 mL via INTRAVENOUS

## 2013-03-06 MED ORDER — HYDROCODONE-ACETAMINOPHEN 7.5-325 MG PO TABS
1.0000 | ORAL_TABLET | Freq: Four times a day (QID) | ORAL | Status: DC | PRN
  Administered 2013-03-06 – 2013-03-08 (×5): 1 via ORAL
  Filled 2013-03-06 (×6): qty 1

## 2013-03-06 MED ORDER — FUROSEMIDE 10 MG/ML IJ SOLN
40.0000 mg | Freq: Once | INTRAMUSCULAR | Status: AC
  Administered 2013-03-06: 40 mg via INTRAVENOUS

## 2013-03-06 NOTE — Progress Notes (Signed)
OT Cancellation Note  Patient Details Name: TARICA HARL MRN: [id number] DOB: 01/05/1946   Cancelled Treatment:    Reason Eval/Treat Not Completed: Medical issues which prohibited therapy.  Noted pt is scheduled for cardiac cath tomorrow.  Will check back Tuesday.    Azari Janssens 03/06/2013, 11:23 AM Marica Otter, OTR/L [phone] 03/06/2013

## 2013-03-06 NOTE — Progress Notes (Signed)
[facility] PROGRESS NOTE  [name] ION:[id number] DOB: [birth year] DOA: 02/23/2013 PCP: Pcp Not In System  Brief history  67 year old female with hypertension, fibromyalgia, diverticulitis presented to the ED with three-day history of worsening abdominal pain, nausea, vomiting, diarrhea, and hematochezia. The patient was noted to be hypotensive with a blood pressure of 70's/30's. PCCM was consulted and the patient was admitted to the ICU. The patient was placed on vasopressors and given intravenous fluids. Gastroenterology was consulted and recommended a CT of the abdomen and pelvis in addition to possibly an unprepped colonoscopy and upper endoscopy. CT of the abdomen and pelvis revealed a dilated gallbladder without any wall thickening or dilated ducts. On 02/25/2013, the patient developed chest pain and left arm pain. Her troponin was noted to be elevated and she developed acute respiratory failure with pulmonary edema. Her endoscopy/sigmoidoscopy was canceled. 02/26/2013 CT angiography chest revealed right lower lobe pulmonary embolus. The patient was started on heparin drip. Cardiology was consulted and was concerned about NSTEMI vs Takotsubo's cardiomyopathy. 02/27/2003 the echocardiogram showed severe hypokinesis of the distal anterolateral septum with grade 3 diastolic dysfunction. The patient was started on intravenous furosemide and diuresed. The patient was extubated on 03/03/2013. The patient was transitioned to the medical floor on 03/04/2013. Currently cardiology plans for cardiac catheterization to clarify obstructive coronary versus Takatsubo's cardiomyopathy.  Assessment/Plan:  Acute respiratory failure  -Multifactorial including pulmonary edema and pulmonary embolus  -Currently stable on room air  Pulmonary embolus  -Continue heparin drip  -02/26/2013 CTA of the chest revealed right lower lobe pulmonary embolus with bilateral pleural effusions and mild pulmonary edema   -appears unprovoked Acute systolic and diastolic CHF  -02/27/2003 Echo--EF not mentioned the patient had severe HK of mid-to distal anterolateral septum and anterior myocardium. There was akinesis of the inferior septal myocardium, moderate HK of apex, grade 3 diastolic dysfunction  -Cardiology is following  -Cardiac catheterization planned 03/07/2013  -Continue aspirin, furosemide, Lipitor  - continue oral furosemide--maintain even balance I/O  -neg 7.2 liters for the admission  NSTEMI  -Heparin drip continued in the setting of pulmonary embolus  -Will transition to oral anticoagulation after cardiac catheterization  -Continue lisinopril  Acute kidney injury  -Resolved  -Secondary to hypotension and hypovolemia  Diarrhea  -Patient has chronic loose stools in the setting of IBS  -Stool pathogen panel negative, C. difficile PCR negative  Hypothyroidism  -Continue Synthroid  -TSH 3.945  Fibromyalgia  -Continue Lyrica  Hyperglycemia/impaired glucose intolerance -Continue NovoLog sliding scale -Hemoglobin A1c 6.1 Family Communication: Updated husband on telephone  Disposition Plan: SNF    Procedures/Studies: Ct Abdomen Pelvis Wo Contrast  02/23/2013   *RADIOLOGY REPORT*  Clinical Data: Diarrhea and abdominal pain  CT ABDOMEN AND PELVIS WITHOUT CONTRAST  Technique:  Multidetector CT imaging of the abdomen and pelvis was performed following the standard protocol without intravenous contrast.  Comparison: 10/14/2012  Findings: The lung bases are free of acute infiltrate or sizable effusion.  The liver and spleen are stable in appearance from prior exam.  The gallbladder is significantly dilated however no pericholecystic fluid or wall thickening is noted.  No definitive cholelithiasis is seen.  No extrahepatic biliary ductal dilatation is noted.  The adrenal glands and pancreas are within normal limits.  The kidneys are well visualized bilaterally.  No renal calculi or obstructive changes  are noted.  A Foley catheter is decompressed the bladder.  The appendix is not well visualized may been surgically removed.  Postsurgical changes in the stomach are  again seen.  IMPRESSION: Dilated gallbladder although no wall thickening or pericholecystic fluid is noted.  No cholelithiasis is seen.  No acute abnormality is noted.   Original Report Authenticated By: Alcide Clever, M.D.   Dg Abd 1 View  02/26/2013   *RADIOLOGY REPORT*  Clinical Data: Status post nasogastric catheter placement  ABDOMEN - 1 VIEW  Comparison: 02/23/2013  Findings: A nasogastric catheter is now noted within the mid stomach.  Scattered large and small bowel gas is noted.  No definitive obstructive changes are seen.  The osseous structures are stable.  IMPRESSION: Nasogastric catheter within the stomach.   Original Report Authenticated By: Alcide Clever, M.D.   Dg Abd 1 View  02/23/2013   *RADIOLOGY REPORT*  Clinical Data: Abdominal pain, weakness  ABDOMEN - 1 VIEW  Comparison: CT abdomen pelvis - 10/14/2012  Findings:  There is mild nonspecific gaseous distension of a loop of small bowel within the midhemiabdomen measuring approximately 4.3 cm in diameter.  This finding is associated with a paucity of distal colonic gas.  No supine evidence of pneumoperitoneum.  No definite pneumatosis or portal venous gas.  No definite abnormal intra-abdominal calcifications.  Multilevel thoracolumbar spine degenerative change suspected.  IMPRESSION: Mild gaseous distension of a loop of small bowel left mid hemiabdomen, nonspecific but could be seen in the setting of early small bowel obstruction.  Clinical correlation is advised.   Original Report Authenticated By: Tacey Ruiz, MD   Ct Angio Chest Pe W/cm &/or Wo Cm  02/26/2013   *RADIOLOGY REPORT*  Clinical Data: Short of breath.  Rule out pulmonary embolism. Respiratory distress  CT ANGIOGRAPHY CHEST  Technique:  Multidetector CT imaging of the chest using the standard protocol during bolus  administration of intravenous contrast. Multiplanar reconstructed images including MIPs were obtained and reviewed to evaluate the vascular anatomy.  Contrast: OMNIPAQUE IOHEXOL 350 MG/ML SOLN  Comparison: Chest x-ray 02/25/2013  Findings: Image quality limited by motion.  The patient was not able to hold still.  The patient is intubated.  There are bilateral pleural effusions and bibasilar dependent atelectasis.  Small filling defect is present in the pulmonary artery supplying the superior segment of the right lower lobe.  This is best seen on axial thin sections and is seen on multiple images.  No other pulmonary emboli are identified.  Coronary calcification.  Heart size within normal limits.  Mild airspace disease bilaterally consistent with residual pulmonary edema.  No acute bony change.  IMPRESSION: Small segmental pulmonary embolism to the superior segment right lower lobe.  Bilateral effusions and bibasilar atelectasis.  Mild pulmonary edema.  Critical Value/emergent results were called by telephone at the time of interpretation on 02/26/2013 at 1223 hours to Dr. Maple Hudson, who verbally acknowledged these results.   Original Report Authenticated By: Janeece Riggers, M.D.   US Abdomen Port  02/24/2013   *RADIOLOGY REPORT*  Clinical Data:  Markedly distended gallbladder on CT scan of 02/23/2013. Bilirubin is not elevated.  COMPLETE ABDOMINAL ULTRASOUND  Comparison:  CT scan dated 02/23/2013 and ultrasound dated 10/16/2012  Findings:  Gallbladder:  No gallstones, gallbladder wall thickening, or pericholecystic fluid. The gallbladder is distended.  Negative sonographic Murphy's sign.  Common bile duct:  Common bile duct is 11 mm in diameter, unchanged since 10/16/2012.  No dilated intrahepatic bile ducts.  Liver:  No focal lesion identified.  Within normal limits in parenchymal echogenicity.  IVC:  Normal.  Pancreas:  Head and body of the pancreas are normal.  Tail is  obscured by bowel.  Spleen:  Normal.   Formal 5 cm in length.  Right Kidney:  Normal.  10.8 cm in length.  Left Kidney:  Normal.  10.0 cm in length.  Abdominal aorta:  Normal.  2.1 cm in diameter.  IMPRESSION: The gallbladder is distended but there are no stones and the wall is not thickened. The gallbladder was distended on the prior ultrasound of 10/16/2012 but not to quite the same degree. There is chronic dilatation of the common bile duct but there are no dilated intrahepatic bile ducts.  The patient's bilirubin is not elevated.   Original Report Authenticated By: Francene Boyers, M.D.   Dg Chest Port 1 View  03/04/2013   *RADIOLOGY REPORT*  Clinical Data: Follow up atelectasis  PORTABLE CHEST - 1 VIEW  Comparison: Prior chest x-ray 03/03/2013  Findings: Interval extubation and removal of nasogastric tube. Left IJ approach central venous catheter remains in unchanged position with the tip in the distal left innominate vein. The patient is rotated toward the right.  Improving low lung volumes and bibasilar atelectasis.  The lungs are otherwise clear.  Stable mild cardiomegaly.  IMPRESSION:  1.  Improving inspiratory volumes and decreased bibasilar atelectasis. 2.  Interval extubation and removal of the nasogastric tube.   Original Report Authenticated By: Malachy Moan, M.D.   Dg Chest Port 1 View  03/03/2013   CLINICAL DATA:  Acute respiratory failure with hypoxia. Myocardial infarct.  EXAM: PORTABLE CHEST - 1 VIEW  COMPARISON:  03/02/2013  FINDINGS: Support apparatus remains in appropriate position. Low lung volumes again noted, however both lungs are grossly clear. Tiny left pleural effusion 1st pleural thickening noted. Heart size is normal.  IMPRESSION: Low lung volumes. No acute findings.   Electronically Signed   By: Myles Rosenthal   On: 03/03/2013 07:18   Dg Chest Port 1 View  03/02/2013   *RADIOLOGY REPORT*  Clinical Data: CHF  PORTABLE CHEST - 1 VIEW  Comparison: Prior radiograph from 03/01/2013  Located 4.9 cm above the carina.   Enteric tube courses into the abdomen.  Cardiomegaly not significantly changed.  Allowing for patient rotation, diffuse prominence of the interstitial markings and pulmonary vascular congestion is grossly similar as compared to the prior exam. A left pleural effusion is slightly improved.  Dense retrocardiac left lower lobe opacity persists, likely atelectasis and / or effusion.  No pneumothorax.  Osseous structures are unchanged.  IMPRESSION: 1. Tip of the endotracheal tube 4.9 cm above the carina.  Remaining support apparatus in stable position. 2.  Persistent pulmonary edema with slightly improved left pleural effusions. 3.  Persistent dense retrocardiac left lower lobe opacity, likely a combination of atelectasis and effusion.   Original Report Authenticated By: Rise Mu, M.D.   Dg Chest Port 1 View  03/01/2013   *RADIOLOGY REPORT*  Clinical Data: Check ETT  PORTABLE CHEST - 1 VIEW  Comparison: 02/28/2013  Findings: Left basilar opacity, likely a combination of atelectasis and small pleural effusion.  Mild pulmonary vascular congestion without frank interstitial edema.  Mild cardiomegaly.  Endotracheal tube terminates 4.5 cm above the carina.  Enteric tube courses below the diaphragm.  Left IJ venous catheter terminates in the left brachiocephalic vein.  IMPRESSION: Endotracheal tube terminates 4.5 cm above the carina.  Left basilar opacity, likely a combination of atelectasis and small pleural effusion.  Additional support apparatus as above.   Original Report Authenticated By: Charline Bills, M.D.   Dg Chest Port 1 View  02/28/2013   *RADIOLOGY  REPORT*  Clinical Data: Respiratory failure  PORTABLE CHEST - 1 VIEW  Comparison: 02/27/2013  Findings: The cardiac shadow is stable.  An endotracheal tube, left jugular central line and nasogastric catheter are all stable in appearance.  Mild left basilar atelectasis is noted.  Mild central vascular congestion is noted as well.  The overall appearance  however is improved in the interval from prior exam.  IMPRESSION: Improved vascular congestion.  Persistent left basilar changes are seen.   Original Report Authenticated By: Alcide Clever, M.D.   Dg Chest Port 1 View  02/27/2013   *RADIOLOGY REPORT*  Clinical Data: 67 year old female with respiratory failure - endotracheal tube retracted.  PORTABLE CHEST - 1 VIEW  Comparison: 02/27/2013  Findings: The endotracheal tube has been slightly retracted with tip now 2.1 cm above the carina - satisfactory. An NG tube entering the stomach and left IJ central venous catheter with tip overlying the medial left brachiocephalic vein again identified. Pulmonary vascular congestion, left upper and lower lobe atelectasis, mild pulmonary edema and probable small effusions again noted. There is no evidence of pneumothorax.  IMPRESSION: Slight interval retraction of endotracheal tube now with tip in satisfactory position.  Continued scattered areas of left lung atelectasis, pulmonary vascular congestion/edema and effusions.   Original Report Authenticated By: Harmon Pier, M.D.   Dg Chest Port 1 View  02/27/2013   *RADIOLOGY REPORT*  Clinical Data: Evaluate endotracheal tube placement.  PORTABLE CHEST - 1 VIEW  Comparison: Chest x-ray 02/25/2013.  Findings: Endotracheal tube is in the low position with tip at the carina directed slightly toward the left mainstem bronchus. There is a left-sided internal jugular central venous catheter with tip terminating in the proximal superior vena cava. A nasogastric tube is seen extending into the stomach, however, the tip of the nasogastric tube extends below the lower margin of the image.  The lung volumes are low.  Worsening opacity in the left base, favored to reflect left lower lobe atelectasis, although some underlying air space consolidation is not excluded.  Possible small left pleural effusion. There is cephalization of the pulmonary vasculature and slight indistinctness of the  interstitial markings suggestive of mild pulmonary edema.  Heart size is borderline large.  Upper mediastinal contours are within normal limits.  IMPRESSION: 1.  Support apparatus, as above.  Low position of endotracheal tube which is at the carina directed toward the left mainstem bronchus. This should be withdrawn 4 cm for more optimal placement. 2.  Interval development of probable left lower lobe atelectasis. 3.  Possible small left pleural effusion.  Mild interstitial pulmonary edema.  Critical Value/emergent results were called by telephone at the time of interpretation on 02/27/2013 at 07:45 a.m. to nurse Marylene Land (for Dr. Marchelle Gearing), who verbally acknowledged these results.   Original Report Authenticated By: Trudie Reed, M.D.   Dg Chest Port 1 View  02/26/2013   CLINICAL DATA:  Intubation.  EXAM: PORTABLE CHEST - 1 VIEW  COMPARISON:  02/23/2013  FINDINGS: Left central line remains in stable position. Endotracheal tube has been placed and is 2 cm above the carinal. Bilateral perihilar airspace opacities, slightly more pronounced in the upper lobes. This could represent edema, possibly negative pressure edema. The heart is normal size. No effusions. No acute bony abnormality.  IMPRESSION: Endotracheal tube 2 cm above the carinal.  New perihilar and upper lobe opacities, question edema.   Electronically Signed   By: Charlett Nose   On: 02/26/2013 00:06   Dg Chest Portable 1 View  02/23/2013   *RADIOLOGY REPORT*  Clinical Data: Neck pain.  Diarrhea.  Abdominal pain.  Rectal bleeding.  PORTABLE CHEST - 1 VIEW  Comparison: Chest x-ray 03/29/2009.  Findings: There is a left-sided internal jugular central venous catheter with tip terminating in the proximal superior vena cava. Lung volumes are slightly low.  No acute consolidative air space disease.  No pleural effusions.  No evidence of pulmonary edema. Heart size is within normal limits.  Upper mediastinal contours are within normal limits for the portable  supine technique.  No pneumothorax.  IMPRESSION: 1.  No radiographic evidence of acute cardiopulmonary disease. 2.  Left internal jugular central venous catheter with tip in the proximal superior vena cava.   Original Report Authenticated By: Trudie Reed, M.D.         Subjective: Patient complains of "congestion" but denies any of her shortness of breath. No fevers, chills, chest discomfort, nausea, vomiting, diarrhea. She has a mostly nonproductive cough. No abdominal pain or dysuria.  Objective: Filed Vitals:   03/05/13 2105 03/05/13 2209 03/06/13 0522 03/06/13 1020  BP: 131/43 128/60 125/71 130/55  Pulse: 97  98   Temp: 98.2 F (36.8 C)  98.3 F (36.8 C)   TempSrc: Oral  Oral   Resp: 22  20   Height:      Weight:   82.7 kg (182 lb 5.1 oz)   SpO2: 91%  94%     Intake/Output Summary (Last 24 hours) at 03/06/13 1146 Last data filed at 03/06/13 1010  Gross per 24 hour  Intake  688.5 ml  Output    875 ml  Net -186.5 ml   Weight change: 0.2 kg (7.1 oz) Exam:   General:  Pt is alert, follows commands appropriately, not in acute distress  HEENT: No icterus, No thrush,  [place]/AT  Cardiovascular: RRR, S1/S2, no rubs, no gallops  Respiratory: Diminished breath sounds at the bases without any wheezing or crackles.   Abdomen: Soft/+BS, non tender, non distended, no guarding  Extremities: 2+ edema, No lymphangitis, No petechiae, No rashes, no synovitis  Data Reviewed: Basic Metabolic Panel:  Recent Labs Lab 02/28/13 0505 03/01/13 0500  03/02/13 0530 03/03/13 0520 03/04/13 0515 03/05/13 0425 03/06/13 0545  NA 139 143  < > 145 148* 146* 143 138  K 3.3* 2.8*  < > 4.1 3.5 3.4* 3.5 3.5  CL 108 108  < > 110 109 105 100 99  CO2 19 24  < > 29 32 35* 36* 32  GLUCOSE 167* 142*  < > 159* 187* 133* 166* 121*  BUN 25* 29*  < > 22 29* 23 18 11   CREATININE 0.86 0.84  < > 0.64 0.69 0.63 0.57 0.52  CALCIUM 7.6* 8.3*  < > 8.5 8.8 9.0 9.2 8.5  MG 1.8 1.6  --  1.8  --   --   --    --   PHOS 2.9 2.8  --   --   --   --   --   --   < > = values in this interval not displayed. Liver Function Tests: No results found for this basename: AST, ALT, ALKPHOS, BILITOT, PROT, ALBUMIN,  in the last 168 hours No results found for this basename: LIPASE, AMYLASE,  in the last 168 hours No results found for this basename: AMMONIA,  in the last 168 hours CBC:  Recent Labs Lab 02/28/13 0505 03/01/13 0500 03/02/13 0530 03/03/13 0520 03/05/13 0425 03/06/13 0545  WBC 15.1* 17.1* 17.5* 13.3*  16.4* 11.4*  NEUTROABS 11.7* 12.3* 13.4*  --   --   --   HGB 9.2* 9.6* 9.7* 9.6* 10.2* 9.6*  HCT 28.2* 29.5* 31.6* 31.4* 34.5* 31.7*  MCV 83.7 84.8 88.0 89.5 91.0 89.0  PLT 267 303 307 268 339 290   Cardiac Enzymes:  Recent Labs Lab 03/01/13 0500  CKTOTAL 210*  TROPONINI <0.30   BNP: No components found with this basename: POCBNP,  CBG:  Recent Labs Lab 03/05/13 0759 03/05/13 1146 03/05/13 1707 03/05/13 2117 03/06/13 0726  GLUCAP 120* 140* 150* 151* 141*    Recent Results (from the past 240 hour(s))  STOOL CULTURE     Status: None   Collection Time    02/27/13 10:18 AM      Result Value Range Status   Specimen Description STOOL   Final   Special Requests NONE   Final   Culture     Final   Value: NO SALMONELLA, SHIGELLA, CAMPYLOBACTER, YERSINIA, OR E.COLI 0157:H7 ISOLATED     Note: REDUCED NORMAL FLORA PRESENT     Performed at [facility]   Report Status 03/03/2013 FINAL   Final     Scheduled Meds: . antiseptic oral rinse  15 mL Mouth Rinse QID  . [START ON 03/07/2013] aspirin  324 mg Oral Pre-Cath  . aspirin  81 mg Oral Daily  . atorvastatin  10 mg Oral q1800  . chlorhexidine  15 mL Mouth Rinse BID  . [START ON 03/07/2013] diazepam  5 mg Oral On Call  . feeding supplement  1 Container Oral BID PC  . feeding supplement  237 mL Oral Q24H  . furosemide  40 mg Intravenous Once  . furosemide  40 mg Oral Daily  . insulin aspart  0-20 Units Subcutaneous TID  WC  . lisinopril  2.5 mg Oral Daily  . metoprolol tartrate  50 mg Oral BID  . multivitamin with minerals  1 tablet Oral Daily  . pantoprazole  40 mg Oral Daily  . potassium chloride  40 mEq Oral Daily  . pregabalin  100 mg Oral BID  . saccharomyces boulardii  250 mg Oral BID  . sodium chloride  3 mL Intravenous Q12H   Continuous Infusions: . [START ON 03/07/2013] sodium chloride    . heparin 1,050 Units/hr (03/05/13 1839)     Kathlee Barnhardt, DO  [facility] Pager [phone]  If 7PM-7AM, please contact night-coverage [email or web address] Password [email or web address] 03/06/2013, 11:46 AM   LOS: 11 days

## 2013-03-06 NOTE — Progress Notes (Addendum)
Subjective:  "I still feel congested "no chest pain. Had questions about procedure. Answered.  Objective:  Vital Signs in the last 24 hours: Temp:  [97.8 F (36.6 C)-98.3 F (36.8 C)] 98.3 F (36.8 C) (09/07 0522) Pulse Rate:  [93-98] 98 (09/07 0522) Resp:  [20-23] 20 (09/07 0522) BP: (125-131)/(43-71) 125/71 mmHg (09/07 0522) SpO2:  [91 %-94 %] 94 % (09/07 0522) Weight:  [82.7 kg (182 lb 5.1 oz)] 82.7 kg (182 lb 5.1 oz) (09/07 0522)  Intake/Output from previous day: 09/06 0701 - 09/07 0700 In: 685.5 [P.O.:360; I.V.:325.5] Out: 475 [Urine:475]   Physical Exam: General: Well developed, well nourished, in no acute distress. Anxious Head:  Normocephalic and atraumatic. Lungs: Clear to auscultation and percussion. Heart: Normal S1 and S2.  No murmur, rubs or gallops.  Abdomen: soft, non-tender, positive bowel sounds. Overweight Extremities: No clubbing or cyanosis. No edema. SCDs Neurologic: Alert and oriented x 3.    Lab Results:  Recent Labs  03/05/13 0425 03/06/13 0545  WBC 16.4* 11.4*  HGB 10.2* 9.6*  PLT 339 290    Recent Labs  03/05/13 0425 03/06/13 0545  NA 143 138  K 3.5 3.5  CL 100 99  CO2 36* 32  GLUCOSE 166* 121*  BUN 18 11  CREATININE 0.57 0.52     Telemetry: Sinus rhythm Personally viewed.  Cardiac Studies:  Taketsubo appearance  Assessment/Plan:   67 year old with non-ST elevation myocardial infarction with severe left ventricular systolic dysfunction, resolved hypovolemic/cardiogenic shock, prior acute respiratory failure, PE,  anxiety.  1. Non-ST elevation myocardial infarction  - She is amenable to cardiac catheterization-we will place on catheterization board for tomorrow. N.p.o. past midnight. Risks and benefits of procedure including stroke, heart attack, bleeding, death, vascular damage have been discussed. She is willing to proceed. Questions answered.  - Stress-induced cardiomyopathy on differential. If this is the case, hopeful  resolution.  - Continue with medical management, aspirin, atorvastatin, Lasix, metoprolol, lisinopril.  - Creatinine 0.5   - At 4 AM this tomorrow morning, I will add 50 mL per hour normal saline. Monitor for any signs of worsening "congestion"  - Precatheterization orders have been placed.  2. Cardiomyopathy  - Newly discovered. She states that she still feels congested. Lungs do not sound wet. I will give her 40 mg of IV Lasix x1.  3. Mild anemia   - Hemoglobin 9.6. Monitoring.  4. Pulmonary embolism  - Small segmental pulmonary embolism to the superior segment right  lower lobe. IV heparin.      SKAINS, MARK 03/06/2013, 8:06 AM

## 2013-03-06 NOTE — Progress Notes (Signed)
ANTICOAGULATION CONSULT NOTE - Follow-up Consult  Pharmacy Consult for Heparin Indication: Pulmonary Embolism  No Known Allergies  Patient Measurements: Height: 5\' 2"  (157.5 cm) Weight: 182 lb 5.1 oz (82.7 kg) IBW/kg (Calculated) : 50.1 Heparin Dosing Weight: 70 kg  Vital Signs: Temp: 98.3 F (36.8 C) (09/07 0522) Temp src: Oral (09/07 0522) BP: 125/71 mmHg (09/07 0522) Pulse Rate: 98 (09/07 0522)  Labs:  Recent Labs  03/04/13 0515 03/05/13 0425 03/06/13 0545  HGB  --  10.2* 9.6*  HCT  --  34.5* 31.7*  PLT  --  339 290  HEPARINUNFRC 0.63 0.47 0.37  CREATININE 0.63 0.57 0.52    Estimated Creatinine Clearance: 69.8 ml/min (by C-G formula based on Cr of 0.52).   Assessment: 89 yof admitted 8/27 with persistent hypotension, suspected gastroenteritis, acute renal failure and hypovolemic shock.  8/29 pt developed atypical chest pain with elevated troponins and respiratory distress requiring intubation.  RLL PE also noted on CT.  IV heparin started 8/30.    Heparin level therapeutic but trending down to 0.37 on IV heparin 1050 units/hr. No bleeding. Hgb low but stable, plts wnl. Scr wnl for CG CrCl 70 ml/min. Bilateral LE dopplers negative for DVT  Plan for cardiac catheterization Monday (9/8) then patient will need long-term oral anticoagulation.   Goal of Therapy:  Heparin level 0.3-0.7 units/ml Monitor platelets by anticoagulation protocol: Yes   Plan:   Continue heparin rate at 1050 units/hr (=10.5 ml/hr).  Check heparin level and CBC daily while on heparin.    F/u long term anticoagulation after cath  Geoffry Paradise, PharmD, BCPS Pager: [phone] 7:21 AM Pharmacy #: [phone]

## 2013-03-06 NOTE — Progress Notes (Signed)
Pt sitting up in chair for several hours today visiting with company. Tolerated well. Appetite fair. Using bedside commode with assist of one.Pt to go to [facility] in the AM for Cardiac Cath. IV called to start 2 IV's as ordered. Permit signed

## 2013-03-07 ENCOUNTER — Encounter (HOSPITAL_COMMUNITY)
Admission: EM | Disposition: A | Discharge status: Skilled Nursing Facility | Payer: Self-pay | Source: Home / Self Care | Attending: Pulmonary Disease

## 2013-03-07 ENCOUNTER — Encounter (HOSPITAL_COMMUNITY)
Admission: EM | Disposition: A | Discharge status: Skilled Nursing Facility | Payer: Medicare Other | Source: Home / Self Care | Attending: Pulmonary Disease

## 2013-03-07 DIAGNOSIS — I2510 Atherosclerotic heart disease of native coronary artery without angina pectoris: Secondary | ICD-10-CM

## 2013-03-07 HISTORY — PX: LEFT HEART CATHETERIZATION WITH CORONARY ANGIOGRAM: SHX5451

## 2013-03-07 LAB — CBC
MCV: 88.1 fL (ref 78.0–100.0)
Platelets: 281 10*3/uL (ref 150–400)
RDW: 16.1 % — ABNORMAL HIGH (ref 11.5–15.5)
WBC: 9.8 10*3/uL (ref 4.0–10.5)

## 2013-03-07 LAB — HEPARIN LEVEL (UNFRACTIONATED): Heparin Unfractionated: 0.43 IU/mL (ref 0.30–0.70)

## 2013-03-07 LAB — GLUCOSE, CAPILLARY: Glucose-Capillary: 137 mg/dL — ABNORMAL HIGH (ref 70–99)

## 2013-03-07 SURGERY — LEFT HEART CATHETERIZATION WITH CORONARY ANGIOGRAM
Anesthesia: LOCAL

## 2013-03-07 SURGERY — LEFT HEART CATH
Anesthesia: Moderate Sedation | Laterality: Right

## 2013-03-07 MED ORDER — SODIUM CHLORIDE 0.9 % IV SOLN: 250.0000 mL | INTRAVENOUS | Status: DC | PRN

## 2013-03-07 MED ORDER — VERAPAMIL HCL 2.5 MG/ML IV SOLN
INTRAVENOUS | Status: AC
  Filled 2013-03-07: qty 2

## 2013-03-07 MED ORDER — SODIUM CHLORIDE 0.9 % IJ SOLN: 3.0000 mL | Freq: Two times a day (BID) | INTRAMUSCULAR | Status: DC

## 2013-03-07 MED ORDER — HEPARIN SODIUM (PORCINE) 1000 UNIT/ML IJ SOLN
INTRAMUSCULAR | Status: AC
  Filled 2013-03-07: qty 1

## 2013-03-07 MED ORDER — FENTANYL CITRATE 0.05 MG/ML IJ SOLN
INTRAMUSCULAR | Status: AC
  Filled 2013-03-07: qty 2

## 2013-03-07 MED ORDER — NITROGLYCERIN 0.2 MG/ML ON CALL CATH LAB
INTRAVENOUS | Status: AC
  Filled 2013-03-07: qty 1

## 2013-03-07 MED ORDER — HEPARIN (PORCINE) IN NACL 100-0.45 UNIT/ML-% IJ SOLN
1050.0000 [IU]/h | INTRAMUSCULAR | Status: DC
  Administered 2013-03-07: 15:00:00 1050 [IU]/h via INTRAVENOUS
  Filled 2013-03-07: qty 250

## 2013-03-07 MED ORDER — MIDAZOLAM HCL 2 MG/2ML IJ SOLN
INTRAMUSCULAR | Status: AC
  Filled 2013-03-07: qty 2

## 2013-03-07 MED ORDER — RIVAROXABAN 15 MG PO TABS
15.0000 mg | ORAL_TABLET | Freq: Two times a day (BID) | ORAL | Status: DC
  Filled 2013-03-07 (×2): qty 1

## 2013-03-07 MED ORDER — HEPARIN (PORCINE) IN NACL 2-0.9 UNIT/ML-% IJ SOLN
INTRAMUSCULAR | Status: AC
  Filled 2013-03-07: qty 1000

## 2013-03-07 MED ORDER — SODIUM CHLORIDE 0.9 % IJ SOLN: 3.0000 mL | INTRAMUSCULAR | Status: DC | PRN

## 2013-03-07 MED ORDER — LIDOCAINE HCL (PF) 1 % IJ SOLN
INTRAMUSCULAR | Status: AC
  Filled 2013-03-07: qty 30

## 2013-03-07 MED ORDER — RIVAROXABAN 15 MG PO TABS
15.0000 mg | ORAL_TABLET | Freq: Two times a day (BID) | ORAL | Status: DC
  Administered 2013-03-07 – 2013-03-08 (×2): 15 mg via ORAL
  Filled 2013-03-07 (×4): qty 1

## 2013-03-07 MED ORDER — RIVAROXABAN 20 MG PO TABS: 20.0000 mg | ORAL_TABLET | Freq: Every day | ORAL | Status: DC

## 2013-03-07 MED ORDER — ONDANSETRON HCL 4 MG/2ML IJ SOLN: 4.0000 mg | Freq: Four times a day (QID) | INTRAMUSCULAR | Status: DC | PRN

## 2013-03-07 MED ORDER — SODIUM CHLORIDE 0.9 % IV SOLN
1.0000 mL/kg/h | INTRAVENOUS | Status: AC
  Administered 2013-03-07: 15:00:00 1 mL/kg/h via INTRAVENOUS

## 2013-03-07 NOTE — Progress Notes (Signed)
PT Cancellation Note  Patient Details Name: [name] MRN: [id number] DOB: Nov 11, 1945   Cancelled Treatment:    Reason Eval/Treat Not Completed: Patient at procedure or test/unavailable   [facility] 03/07/2013, 11:22 AM

## 2013-03-07 NOTE — CV Procedure (Signed)
   Cardiac Catheterization Procedure Note  Name: [name] MRN: [id number] DOB: [birth year]  Procedure: Left Heart Cath, Selective Coronary Angiography, LV angiography  Indication: NSTEMI, CHF   Procedural Details: The right wrist was prepped, draped, and anesthetized with 1% lidocaine. Using the modified Seldinger technique, a 5/6 French slender sheath was introduced into the right radial artery. 3 mg of verapamil was administered through the sheath, weight-based unfractionated heparin was administered intravenously. Standard Judkins catheters were used for selective coronary angiography and left ventriculography. Catheter exchanges were performed over an exchange length guidewire. There were no immediate procedural complications. A TR band was used for radial hemostasis at the completion of the procedure.  The patient was transferred to the post catheterization recovery area for further monitoring.  Procedural Findings: Hemodynamics: AO 100/54 LV 102/22  Coronary angiography: Coronary dominance: right  Left mainstem:  Mild calcification, no obstructive disease noted.  Left anterior descending (LAD): The proximal and mid-LAD are heavily calcified. The vessel has 50% stenosis just before the first diagonal branch. Otherwise there is minor irregularity of the LAD noted without significant stenosis.   Left circumflex (LCx): The LCx is a large vessel without obstructive disease. The mid-circumflex has 30% stenosis. There is a large first OM without significant disease.  Right coronary artery (RCA): The RCA is a large, dominant vessel. There is heavy diffuse calcification. There are diffuse luminal irregularities without associated significant stenoses. The PDA and PLA branches are patent.  Left ventriculography: Left ventricular systolic function is normal, LVEF is estimated at 55-65%, there is no significant mitral regurgitation   Final Conclusions:   1. Moderate LAD stenosis 2. Minor  nonobstructive LCx and RCA stenoses 3. Normal LV function  Recommendations: The patient has nonobstructive CAD and should be treated medically. Her LV function has completely normalized. 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 Reasonable to treat with a beta-blocker and statin drug. If she is going to be anticoagulated for PE, I would not treat her with antiplatelet drugs. Will write for heparin to resume 8 hours post-cath, but will defer decision for warfarin versus a novel agent to the hospitalist service. Please call if any further cardiac issues arise. thx  Tonny Bollman 03/07/2013, 9:56 AM

## 2013-03-07 NOTE — Progress Notes (Signed)
Pt with new cardiomyopathy, elevated troponin. Plans in place for cardiac cath today to exclude CAD. H/H stable. Pt has no questions this am. Transfer to [facility] for cath this am.   MCALHANY,CHRISTOPHER 03/07/2013 7:27 AM  

## 2013-03-07 NOTE — Plan of Care (Signed)
Problem: Consults Goal: Cardiac Cath Patient Education (See Patient Education module for education specifics.) Outcome: Progressing patietn watch education video per doctor orders./

## 2013-03-07 NOTE — H&P (View-Only) (Signed)
Pt with new cardiomyopathy, elevated troponin. Plans in place for cardiac cath today to exclude CAD. H/H stable. Pt has no questions this am. Transfer to [facility] for cath this am.   [facility] 03/07/2013 7:27 AM

## 2013-03-07 NOTE — Progress Notes (Signed)
ANTICOAGULATION CONSULT NOTE - Follow-up Consult  Pharmacy Consult for Heparin Indication: Pulmonary Embolism  No Known Allergies  Patient Measurements: Height: 5\' 2"  (157.5 cm) Weight: 178 lb 9.2 oz (81 kg) IBW/kg (Calculated) : 50.1 Heparin Dosing Weight: 70 kg  Vital Signs: Temp: 97.4 F (36.3 C) (09/08 0553) Temp src: Oral (09/08 0553) BP: 133/57 mmHg (09/08 0553) Pulse Rate: 92 (09/08 0553)  Labs:  Recent Labs  03/05/13 0425 03/06/13 0545 03/07/13 0500  HGB 10.2* 9.6* 10.0*  HCT 34.5* 31.7* 32.7*  PLT 339 290 281  HEPARINUNFRC 0.47 0.37 0.43  CREATININE 0.57 0.52  --     Estimated Creatinine Clearance: 69.2 ml/min (by C-G formula based on Cr of 0.52).   Assessment: 67 yo F admitted 8/27 with persistent hypotension, suspected gastroenteritis, acute renal failure and hypovolemic shock.  8/29 pt developed atypical chest pain with elevated troponins and respiratory distress requiring intubation.  RLL PE also noted on CT.  IV heparin started 8/30.    Heparin level remains therapeutic this am. No bleeding. Hgb low but stable, plts wnl. Bilateral LE dopplers negative for DVT  Plan for cardiac catheterization today noted, then patient will need long-term oral anticoagulation.   Goal of Therapy:  Heparin level 0.3-0.7 units/ml Monitor platelets by anticoagulation protocol: Yes   Plan:   Continue heparin rate at 1050 units/hr (=10.5 ml/hr).  Check heparin level and CBC daily while on heparin.    F/u long term anticoagulation after cath  Darrol Angel, PharmD Pager: [phone] 03/07/2013 7:07 AM

## 2013-03-07 NOTE — Progress Notes (Addendum)
ANTICOAGULATION CONSULT NOTE - Follow Up Consult  Pharmacy Consult for Heparin Indication: pulmonary embolus  No Known Allergies  Patient Measurements: Height: 5\' 2"  (157.5 cm) Weight: 178 lb 9.2 oz (81 kg) IBW/kg (Calculated) : 50.1 Heparin Dosing Weight: 70 kg  Vital Signs: Temp: 97.4 F (36.3 C) (09/08 0553) Temp src: Oral (09/08 0553) BP: 133/57 mmHg (09/08 0553) Pulse Rate: 92 (09/08 0553)  Labs:  Recent Labs  03/05/13 0425 03/06/13 0545 03/07/13 0500  HGB 10.2* 9.6* 10.0*  HCT 34.5* 31.7* 32.7*  PLT 339 290 281  HEPARINUNFRC 0.47 0.37 0.43  CREATININE 0.57 0.52  --     Estimated Creatinine Clearance: 69.2 ml/min (by C-G formula based on Cr of 0.52).   Medications:  Prescriptions prior to admission  Medication Sig Dispense Refill  . ALPRAZolam (XANAX) 1 MG tablet Take 1 mg by mouth 3 (three) times daily as needed for anxiety.      . Diphenhydramine-Pseudoephed (BENADRYL ALLERGY/SINUS PO) Take 1 tablet by mouth 2 (two) times daily as needed (for allergies).       . furosemide (LASIX) 20 MG tablet Take 1 tablet (20 mg total) by mouth every morning.  30 tablet    . HYDROcodone-acetaminophen (NORCO) 7.5-325 MG per tablet Take 1 tablet by mouth every 8 (eight) hours as needed for pain (pain).       Marland Kitchen loperamide (IMODIUM) 2 MG capsule Take 2 mg by mouth 2 (two) times daily as needed for diarrhea or loose stools.       . potassium chloride SA (K-DUR,KLOR-CON) 20 MEQ tablet Take 20 mEq by mouth daily.      . pregabalin (LYRICA) 100 MG capsule Take 100 mg by mouth 2 (two) times daily.      Marland Kitchen zolpidem (AMBIEN) 10 MG tablet Take 10 mg by mouth at bedtime as needed for sleep.        Assessment:...transferred from [facility] 9/8 for cath 67 yo F admitted 8/27 with persistent hypotension, N/V/D, acute renal failure and hypovolemic shock. 8/29 pt developed atypical chest pain with elevated troponins and respiratory distress requiring intubation. RLL PE also noted on CT. IV heparin  started 8/30.   Anticoagulation: NSTEMI. Resume heparin post-cath for PE. CBC stable. IM to decide on oral anticoagulant.  Cardiovascular: HTN, CHF 9/8 cath: Nonobstructive CAD;treat medically.LV normal. 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   Endocrinology: Synthroid, A1c 6.1   Gastrointestinal / Nutrition: diverticulitis, IBS with chronic loose stools (Cdiff negative)  Neurology: fibromyalgia on Lyrica  Nephrology: ARF resolved. Scr 0.52  Pulmonary: Acute respiratory failure, extubated 9/4  Hematology / Oncology: CBC stable   Goal of Therapy:  Heparin level 0.3-0.7 units/ml Monitor platelets by anticoagulation protocol: Yes   Plan:  2 hrs post-TR band removal (per Dr. Excell Seltzer), resume IV heparin at 1050 units/hr Will check heparin level 6-8 hrs after heparin resumed and daily.   Alleya Demeter S. Merilynn Finland, PharmD, BCPS Clinical Staff Pharmacist Pager [phone]  Misty Stanley Stillinger 03/07/2013,12:55 PM

## 2013-03-07 NOTE — Progress Notes (Signed)
[facility] PROGRESS NOTE  SHAHED YEOMAN ZOX:[id number] DOB: Mar 11, 1946 DOA: 02/23/2013 PCP: Pcp Not In System  Brief history  67 year old female with hypertension, fibromyalgia, diverticulitis presented to the ED with three-day history of worsening abdominal pain, nausea, vomiting, diarrhea, and hematochezia. The patient was noted to be hypotensive with a blood pressure of 70's/30's. PCCM was consulted and the patient was admitted to the ICU. The patient was placed on vasopressors and given intravenous fluids. Gastroenterology was consulted and recommended a CT of the abdomen and pelvis in addition to possibly an unprepped colonoscopy and upper endoscopy. CT of the abdomen and pelvis revealed a dilated gallbladder without any wall thickening or dilated ducts. On 02/25/2013, the patient developed chest pain and left arm pain. Her troponin was noted to be elevated and she developed acute respiratory failure with pulmonary edema. Her endoscopy/sigmoidoscopy was canceled. 02/26/2013 CT angiography chest revealed right lower lobe pulmonary embolus. The patient was started on heparin drip. Cardiology was consulted and was concerned about NSTEMI vs Takotsubo's cardiomyopathy. 02/27/2003 the echocardiogram showed severe hypokinesis of the distal anterolateral septum with grade 3 diastolic dysfunction. The patient was started on intravenous furosemide and diuresed. The patient was extubated on 03/03/2013. The patient was transitioned to the medical floor on 03/04/2013. Currently cardiology plans for cardiac catheterization to clarify obstructive coronary versus Takatsubo's cardiomyopathy.  Assessment/Plan:  Acute respiratory failure  -Multifactorial including pulmonary edema and pulmonary embolus  -Currently stable on room air  Pulmonary embolus  -Continue heparin drip  -02/26/2013 CTA of the chest revealed right lower lobe pulmonary embolus with bilateral pleural effusions and mild pulmonary edema   -appears unprovoked  Acute systolic and diastolic CHF  -02/27/2003 Echo--EF not mentioned the patient had severe HK of mid-to distal anterolateral septum and anterior myocardium. There was akinesis of the inferior septal myocardium, moderate HK of apex, grade 3 diastolic dysfunction  -Cardiology is following  -Cardiac catheterization planned 03/07/2013  -Continue aspirin, furosemide, Lipitor  - continue oral furosemide--maintain even balance I/O  -neg 7.8 liters for the admission  NSTEMI  -Heparin drip continued in the setting of pulmonary embolus  -Will transition to oral anticoagulation after cardiac catheterization  -Continue lisinopril  Acute kidney injury  -Resolved  -Secondary to hypotension and hypovolemia  Diarrhea  -Patient has chronic loose stools in the setting of IBS  -Stool pathogen panel negative, C. difficile PCR negative  Hypothyroidism  -Continue Synthroid  -TSH 3.945  Fibromyalgia  -Continue Lyrica  Hyperglycemia/impaired glucose intolerance  -Continue NovoLog sliding scale  -Hemoglobin A1c 6.1  Family Communication: no family in room Disposition Plan: SNF      Procedures/Studies: Ct Abdomen Pelvis Wo Contrast  02/23/2013   *RADIOLOGY REPORT*  Clinical Data: Diarrhea and abdominal pain  CT ABDOMEN AND PELVIS WITHOUT CONTRAST  Technique:  Multidetector CT imaging of the abdomen and pelvis was performed following the standard protocol without intravenous contrast.  Comparison: 10/14/2012  Findings: The lung bases are free of acute infiltrate or sizable effusion.  The liver and spleen are stable in appearance from prior exam.  The gallbladder is significantly dilated however no pericholecystic fluid or wall thickening is noted.  No definitive cholelithiasis is seen.  No extrahepatic biliary ductal dilatation is noted.  The adrenal glands and pancreas are within normal limits.  The kidneys are well visualized bilaterally.  No renal calculi or obstructive changes are  noted.  A Foley catheter is decompressed the bladder.  The appendix is not well visualized may been surgically removed.  Postsurgical  changes in the stomach are again seen.  IMPRESSION: Dilated gallbladder although no wall thickening or pericholecystic fluid is noted.  No cholelithiasis is seen.  No acute abnormality is noted.   Original Report Authenticated By: Alcide Clever, M.D.   Dg Abd 1 View  02/26/2013   *RADIOLOGY REPORT*  Clinical Data: Status post nasogastric catheter placement  ABDOMEN - 1 VIEW  Comparison: 02/23/2013  Findings: A nasogastric catheter is now noted within the mid stomach.  Scattered large and small bowel gas is noted.  No definitive obstructive changes are seen.  The osseous structures are stable.  IMPRESSION: Nasogastric catheter within the stomach.   Original Report Authenticated By: Alcide Clever, M.D.   Dg Abd 1 View  02/23/2013   *RADIOLOGY REPORT*  Clinical Data: Abdominal pain, weakness  ABDOMEN - 1 VIEW  Comparison: CT abdomen pelvis - 10/14/2012  Findings:  There is mild nonspecific gaseous distension of a loop of small bowel within the midhemiabdomen measuring approximately 4.3 cm in diameter.  This finding is associated with a paucity of distal colonic gas.  No supine evidence of pneumoperitoneum.  No definite pneumatosis or portal venous gas.  No definite abnormal intra-abdominal calcifications.  Multilevel thoracolumbar spine degenerative change suspected.  IMPRESSION: Mild gaseous distension of a loop of small bowel left mid hemiabdomen, nonspecific but could be seen in the setting of early small bowel obstruction.  Clinical correlation is advised.   Original Report Authenticated By: Tacey Ruiz, MD   Ct Angio Chest Pe W/cm &/or Wo Cm  02/26/2013   *RADIOLOGY REPORT*  Clinical Data: Short of breath.  Rule out pulmonary embolism. Respiratory distress  CT ANGIOGRAPHY CHEST  Technique:  Multidetector CT imaging of the chest using the standard protocol during bolus  administration of intravenous contrast. Multiplanar reconstructed images including MIPs were obtained and reviewed to evaluate the vascular anatomy.  Contrast: OMNIPAQUE IOHEXOL 350 MG/ML SOLN  Comparison: Chest x-ray 02/25/2013  Findings: Image quality limited by motion.  The patient was not able to hold still.  The patient is intubated.  There are bilateral pleural effusions and bibasilar dependent atelectasis.  Small filling defect is present in the pulmonary artery supplying the superior segment of the right lower lobe.  This is best seen on axial thin sections and is seen on multiple images.  No other pulmonary emboli are identified.  Coronary calcification.  Heart size within normal limits.  Mild airspace disease bilaterally consistent with residual pulmonary edema.  No acute bony change.  IMPRESSION: Small segmental pulmonary embolism to the superior segment right lower lobe.  Bilateral effusions and bibasilar atelectasis.  Mild pulmonary edema.  Critical Value/emergent results were called by telephone at the time of interpretation on 02/26/2013 at 1223 hours to Dr. Maple Hudson, who verbally acknowledged these results.   Original Report Authenticated By: Janeece Riggers, M.D.   US Abdomen Port  02/24/2013   *RADIOLOGY REPORT*  Clinical Data:  Markedly distended gallbladder on CT scan of 02/23/2013. Bilirubin is not elevated.  COMPLETE ABDOMINAL ULTRASOUND  Comparison:  CT scan dated 02/23/2013 and ultrasound dated 10/16/2012  Findings:  Gallbladder:  No gallstones, gallbladder wall thickening, or pericholecystic fluid. The gallbladder is distended.  Negative sonographic Murphy's sign.  Common bile duct:  Common bile duct is 11 mm in diameter, unchanged since 10/16/2012.  No dilated intrahepatic bile ducts.  Liver:  No focal lesion identified.  Within normal limits in parenchymal echogenicity.  IVC:  Normal.  Pancreas:  Head and body of the pancreas  are normal.  Tail is obscured by bowel.  Spleen:  Normal.   Formal 5 cm in length.  Right Kidney:  Normal.  10.8 cm in length.  Left Kidney:  Normal.  10.0 cm in length.  Abdominal aorta:  Normal.  2.1 cm in diameter.  IMPRESSION: The gallbladder is distended but there are no stones and the wall is not thickened. The gallbladder was distended on the prior ultrasound of 10/16/2012 but not to quite the same degree. There is chronic dilatation of the common bile duct but there are no dilated intrahepatic bile ducts.  The patient's bilirubin is not elevated.   Original Report Authenticated By: Francene Boyers, M.D.   Dg Chest Port 1 View  03/04/2013   *RADIOLOGY REPORT*  Clinical Data: Follow up atelectasis  PORTABLE CHEST - 1 VIEW  Comparison: Prior chest x-ray 03/03/2013  Findings: Interval extubation and removal of nasogastric tube. Left IJ approach central venous catheter remains in unchanged position with the tip in the distal left innominate vein. The patient is rotated toward the right.  Improving low lung volumes and bibasilar atelectasis.  The lungs are otherwise clear.  Stable mild cardiomegaly.  IMPRESSION:  1.  Improving inspiratory volumes and decreased bibasilar atelectasis. 2.  Interval extubation and removal of the nasogastric tube.   Original Report Authenticated By: Malachy Moan, M.D.   Dg Chest Port 1 View  03/03/2013   CLINICAL DATA:  Acute respiratory failure with hypoxia. Myocardial infarct.  EXAM: PORTABLE CHEST - 1 VIEW  COMPARISON:  03/02/2013  FINDINGS: Support apparatus remains in appropriate position. Low lung volumes again noted, however both lungs are grossly clear. Tiny left pleural effusion 1st pleural thickening noted. Heart size is normal.  IMPRESSION: Low lung volumes. No acute findings.   Electronically Signed   By: Myles Rosenthal   On: 03/03/2013 07:18   Dg Chest Port 1 View  03/02/2013   *RADIOLOGY REPORT*  Clinical Data: CHF  PORTABLE CHEST - 1 VIEW  Comparison: Prior radiograph from 03/01/2013  Located 4.9 cm above the carina.   Enteric tube courses into the abdomen.  Cardiomegaly not significantly changed.  Allowing for patient rotation, diffuse prominence of the interstitial markings and pulmonary vascular congestion is grossly similar as compared to the prior exam. A left pleural effusion is slightly improved.  Dense retrocardiac left lower lobe opacity persists, likely atelectasis and / or effusion.  No pneumothorax.  Osseous structures are unchanged.  IMPRESSION: 1. Tip of the endotracheal tube 4.9 cm above the carina.  Remaining support apparatus in stable position. 2.  Persistent pulmonary edema with slightly improved left pleural effusions. 3.  Persistent dense retrocardiac left lower lobe opacity, likely a combination of atelectasis and effusion.   Original Report Authenticated By: Rise Mu, M.D.   Dg Chest Port 1 View  03/01/2013   *RADIOLOGY REPORT*  Clinical Data: Check ETT  PORTABLE CHEST - 1 VIEW  Comparison: 02/28/2013  Findings: Left basilar opacity, likely a combination of atelectasis and small pleural effusion.  Mild pulmonary vascular congestion without frank interstitial edema.  Mild cardiomegaly.  Endotracheal tube terminates 4.5 cm above the carina.  Enteric tube courses below the diaphragm.  Left IJ venous catheter terminates in the left brachiocephalic vein.  IMPRESSION: Endotracheal tube terminates 4.5 cm above the carina.  Left basilar opacity, likely a combination of atelectasis and small pleural effusion.  Additional support apparatus as above.   Original Report Authenticated By: Charline Bills, M.D.   Dg Chest [facility]  02/28/2013   *RADIOLOGY REPORT*  Clinical Data: Respiratory failure  PORTABLE CHEST - 1 VIEW  Comparison: 02/27/2013  Findings: The cardiac shadow is stable.  An endotracheal tube, left jugular central line and nasogastric catheter are all stable in appearance.  Mild left basilar atelectasis is noted.  Mild central vascular congestion is noted as well.  The overall appearance  however is improved in the interval from prior exam.  IMPRESSION: Improved vascular congestion.  Persistent left basilar changes are seen.   Original Report Authenticated By: Alcide Clever, M.D.   Dg Chest Port 1 View  02/27/2013   *RADIOLOGY REPORT*  Clinical Data: 67 year old female with respiratory failure - endotracheal tube retracted.  PORTABLE CHEST - 1 VIEW  Comparison: 02/27/2013  Findings: The endotracheal tube has been slightly retracted with tip now 2.1 cm above the carina - satisfactory. An NG tube entering the stomach and left IJ central venous catheter with tip overlying the medial left brachiocephalic vein again identified. Pulmonary vascular congestion, left upper and lower lobe atelectasis, mild pulmonary edema and probable small effusions again noted. There is no evidence of pneumothorax.  IMPRESSION: Slight interval retraction of endotracheal tube now with tip in satisfactory position.  Continued scattered areas of left lung atelectasis, pulmonary vascular congestion/edema and effusions.   Original Report Authenticated By: Harmon Pier, M.D.   Dg Chest Port 1 View  02/27/2013   *RADIOLOGY REPORT*  Clinical Data: Evaluate endotracheal tube placement.  PORTABLE CHEST - 1 VIEW  Comparison: Chest x-ray 02/25/2013.  Findings: Endotracheal tube is in the low position with tip at the carina directed slightly toward the left mainstem bronchus. There is a left-sided internal jugular central venous catheter with tip terminating in the proximal superior vena cava. A nasogastric tube is seen extending into the stomach, however, the tip of the nasogastric tube extends below the lower margin of the image.  The lung volumes are low.  Worsening opacity in the left base, favored to reflect left lower lobe atelectasis, although some underlying air space consolidation is not excluded.  Possible small left pleural effusion. There is cephalization of the pulmonary vasculature and slight indistinctness of the  interstitial markings suggestive of mild pulmonary edema.  Heart size is borderline large.  Upper mediastinal contours are within normal limits.  IMPRESSION: 1.  Support apparatus, as above.  Low position of endotracheal tube which is at the carina directed toward the left mainstem bronchus. This should be withdrawn 4 cm for more optimal placement. 2.  Interval development of probable left lower lobe atelectasis. 3.  Possible small left pleural effusion.  Mild interstitial pulmonary edema.  Critical Value/emergent results were called by telephone at the time of interpretation on 02/27/2013 at 07:45 a.m. to nurse Marylene Land (for Dr. Marchelle Gearing), who verbally acknowledged these results.   Original Report Authenticated By: Trudie Reed, M.D.   Dg Chest Port 1 View  02/26/2013   CLINICAL DATA:  Intubation.  EXAM: PORTABLE CHEST - 1 VIEW  COMPARISON:  02/23/2013  FINDINGS: Left central line remains in stable position. Endotracheal tube has been placed and is 2 cm above the carinal. Bilateral perihilar airspace opacities, slightly more pronounced in the upper lobes. This could represent edema, possibly negative pressure edema. The heart is normal size. No effusions. No acute bony abnormality.  IMPRESSION: Endotracheal tube 2 cm above the carinal.  New perihilar and upper lobe opacities, question edema.   Electronically Signed   By: Charlett Nose   On: 02/26/2013 00:06   Dg Chest  Portable 1 View  02/23/2013   *RADIOLOGY REPORT*  Clinical Data: Neck pain.  Diarrhea.  Abdominal pain.  Rectal bleeding.  PORTABLE CHEST - 1 VIEW  Comparison: Chest x-ray 03/29/2009.  Findings: There is a left-sided internal jugular central venous catheter with tip terminating in the proximal superior vena cava. Lung volumes are slightly low.  No acute consolidative air space disease.  No pleural effusions.  No evidence of pulmonary edema. Heart size is within normal limits.  Upper mediastinal contours are within normal limits for the portable  supine technique.  No pneumothorax.  IMPRESSION: 1.  No radiographic evidence of acute cardiopulmonary disease. 2.  Left internal jugular central venous catheter with tip in the proximal superior vena cava.   Original Report Authenticated By: Trudie Reed, M.D.         Subjective: Patient complaint of intermittent headache. There is no visual disturbance, focal general weakness, chest discomfort, shortness breath, nausea, vomiting, diarrhea, abdominal pain. No fevers or chills.  Objective: Filed Vitals:   03/06/13 1321 03/06/13 2035 03/07/13 0307 03/07/13 0553  BP: 114/43 125/61  133/57  Pulse: 94 98  92  Temp: 98.4 F (36.9 C) 98.7 F (37.1 C) 100 F (37.8 C) 97.4 F (36.3 C)  TempSrc: Oral Oral  Oral  Resp: 22 18  19   Height:      Weight:    81 kg (178 lb 9.2 oz)  SpO2: 100% 98%  99%    Intake/Output Summary (Last 24 hours) at 03/07/13 0901 Last data filed at 03/06/13 2245  Gross per 24 hour  Intake 1299.88 ml  Output   2051 ml  Net -751.12 ml   Weight change: -1.7 kg (-3 lb 12 oz) Exam:   General:  Pt is alert, follows commands appropriately, not in acute distress  HEENT: No icterus, No thrush, [place]/AT  Cardiovascular: RRR, S1/S2, no rubs, no gallops  Respiratory: CTA bilaterally, no wheezing, no crackles, no rhonchi  Abdomen: Soft/+BS, non tender, non distended, no guarding  Extremities: trace edema, No lymphangitis, No petechiae, No rashes, no synovitis  Data Reviewed: Basic Metabolic Panel:  Recent Labs Lab 03/01/13 0500  03/02/13 0530 03/03/13 0520 03/04/13 0515 03/05/13 0425 03/06/13 0545  NA 143  < > 145 148* 146* 143 138  K 2.8*  < > 4.1 3.5 3.4* 3.5 3.5  CL 108  < > 110 109 105 100 99  CO2 24  < > 29 32 35* 36* 32  GLUCOSE 142*  < > 159* 187* 133* 166* 121*  BUN 29*  < > 22 29* 23 18 11   CREATININE 0.84  < > 0.64 0.69 0.63 0.57 0.52  CALCIUM 8.3*  < > 8.5 8.8 9.0 9.2 8.5  MG 1.6  --  1.8  --   --   --   --   PHOS 2.8  --   --   --    --   --   --   < > = values in this interval not displayed. Liver Function Tests: No results found for this basename: AST, ALT, ALKPHOS, BILITOT, PROT, ALBUMIN,  in the last 168 hours No results found for this basename: LIPASE, AMYLASE,  in the last 168 hours No results found for this basename: AMMONIA,  in the last 168 hours CBC:  Recent Labs Lab 03/01/13 0500 03/02/13 0530 03/03/13 0520 03/05/13 0425 03/06/13 0545 03/07/13 0500  WBC 17.1* 17.5* 13.3* 16.4* 11.4* 9.8  NEUTROABS 12.3* 13.4*  --   --   --   --  HGB 9.6* 9.7* 9.6* 10.2* 9.6* 10.0*  HCT 29.5* 31.6* 31.4* 34.5* 31.7* 32.7*  MCV 84.8 88.0 89.5 91.0 89.0 88.1  PLT 303 307 268 339 290 281   Cardiac Enzymes:  Recent Labs Lab 03/01/13 0500  CKTOTAL 210*  TROPONINI <0.30   BNP: No components found with this basename: POCBNP,  CBG:  Recent Labs Lab 03/06/13 0726 03/06/13 1206 03/06/13 1647 03/06/13 2120 03/07/13 0746  GLUCAP 141* 113* 122* 97 127*    Recent Results (from the past 240 hour(s))  STOOL CULTURE     Status: None   Collection Time    02/27/13 10:18 AM      Result Value Range Status   Specimen Description STOOL   Final   Special Requests NONE   Final   Culture     Final   Value: NO SALMONELLA, SHIGELLA, CAMPYLOBACTER, YERSINIA, OR E.COLI 0157:H7 ISOLATED     Note: REDUCED NORMAL FLORA PRESENT     Performed at [employer] [facility]   Report Status 03/03/2013 FINAL   Final     Scheduled Meds: . [facility] HOLD] antiseptic oral rinse  15 mL Mouth Rinse QID  . [facility] HOLD] aspirin  81 mg Oral Daily  . [facility] HOLD] atorvastatin  10 mg Oral q1800  . [facility] HOLD] chlorhexidine  15 mL Mouth Rinse BID  . [MAR HOLD] feeding supplement  1 Container Oral BID PC  . [facility] HOLD] feeding supplement  237 mL Oral Q24H  . [facility] HOLD] furosemide  40 mg Oral BID  . [MAR HOLD] insulin aspart  0-20 Units Subcutaneous TID WC  . [MAR HOLD] lisinopril  2.5 mg Oral Daily  . [facility] HOLD] metoprolol tartrate  50 mg Oral BID   . [MAR HOLD] multivitamin with minerals  1 tablet Oral Daily  . [MAR HOLD] pantoprazole  40 mg Oral Daily  . [facility] HOLD] potassium chloride  40 mEq Oral Daily  . [facility] HOLD] pregabalin  100 mg Oral BID  . [MAR HOLD] saccharomyces boulardii  250 mg Oral BID  . sodium chloride  3 mL Intravenous Q12H   Continuous Infusions: . sodium chloride    . heparin Stopped (03/07/13 0803)     Duvid Smalls, DO  [facility] Pager [phone]  If 7PM-7AM, please contact night-coverage [email or web address] Password [email or web address] 03/07/2013, 9:01 AM   LOS: 12 days

## 2013-03-07 NOTE — Progress Notes (Signed)
Patient has accepted bed @ [facility] when stable for discharge. CSW will check back in the morning re: discharge.   Clinical Social Work Department CLINICAL SOCIAL WORK PLACEMENT NOTE 03/07/2013  Patient:  [name], [name]  Account Number:  [id number] Admit date:  02/23/2013  Clinical Social Worker:  Orpah Greek  Date/time:  03/07/2013 04:34 PM  Clinical Social Work is seeking post-discharge placement for this patient at the following level of care:   SKILLED NURSING   (*CSW will update this form in Epic as items are completed)   03/05/2013  Patient/family provided with [facility] Department of Clinical Social Work's list of facilities offering this level of care within the geographic area requested by the patient (or if unable, by the patient's family).  03/05/2013  Patient/family informed of their freedom to choose among providers that offer the needed level of care, that participate in Medicare, Medicaid or managed care program needed by the patient, have an available bed and are willing to accept the patient.  03/05/2013  Patient/family informed of [facility]' ownership interest in [facility], as well as of the fact that they are under no obligation to receive care at this facility.  PASARR submitted to EDS on 03/05/2013 PASARR number received from EDS on 03/05/2013  FL2 transmitted to all facilities in geographic area requested by pt/family on  03/07/2013 FL2 transmitted to all facilities within larger geographic area on   Patient informed that his/her managed care company has contracts with or will negotiate with  certain facilities, including the following:     Patient/family informed of bed offers received:  03/07/2013 Patient chooses bed at [facility] Physician recommends and patient chooses bed at    Patient to be transferred to [facility] on   Patient to be transferred to facility by   The following  physician request were entered in Epic:   Additional Comments:   Unice Bailey, LCSW [facility] Clinical Social Worker cell #: [phone]

## 2013-03-07 NOTE — Progress Notes (Signed)
  Pharmacy Note (Brief) - Resume IV Heparin  Patient is now back at [facility] s/p cath at [facility]. See previous RPh notes from earlier today for full details. Per Dr. Excell Seltzer, plan is to resume IV heparin at 1050 units/hr 2 hours after TR Band removal (typed as a comment on the Heparin per Rx consult). Spoke with [facility] RN, TR band is currently fully deflated and reportedly was fully deflated upon arrival at [facility]. Will count 13:00 as deflation time and plan to resume IV heparin at 1050 units/hr at 15:00 today.  1) At 15:00, resume heparin at 1050 units/hr. 2) Check heparin level in 8 hours. 3) Daily heparin level, CBC 4) Awaiting decision re: long-term anticoagulation.  Darrol Angel, PharmD Pager: [phone] 03/07/2013 1:36 PM

## 2013-03-07 NOTE — Interval H&P Note (Signed)
History and Physical Interval Note:  03/07/2013 9:14 AM  [name]  has presented today for surgery, with the diagnosis of cp  The various methods of treatment have been discussed with the patient and family. After consideration of risks, benefits and other options for treatment, the patient has consented to  Procedure(s): LEFT HEART CATHETERIZATION WITH CORONARY ANGIOGRAM (N/A) as a surgical intervention .  The patient's history has been reviewed, patient examined, no change in status, stable for surgery.  I have reviewed the patient's chart and labs.  Questions were answered to the patient's satisfaction.    Cath Lab Visit (complete for each Cath Lab visit)  Clinical Evaluation Leading to the Procedure:   ACS: yes  Non-ACS:    Anginal Classification: CCS IV  Anti-ischemic medical therapy: Minimal Therapy (1 class of medications)  Non-Invasive Test Results: No non-invasive testing performed  Prior CABG: No previous CABG         Tonny Bollman

## 2013-03-08 ENCOUNTER — Other Ambulatory Visit: Payer: Self-pay | Admitting: *Deleted

## 2013-03-08 DIAGNOSIS — I2510 Atherosclerotic heart disease of native coronary artery without angina pectoris: Secondary | ICD-10-CM | POA: Diagnosis present

## 2013-03-08 LAB — GLUCOSE, CAPILLARY: Glucose-Capillary: 115 mg/dL — ABNORMAL HIGH (ref 70–99)

## 2013-03-08 LAB — BASIC METABOLIC PANEL
Chloride: 101 mEq/L (ref 96–112)
GFR calc Af Amer: >90 mL/min (ref >90)
Potassium: 3 mEq/L — ABNORMAL LOW (ref 3.5–5.1)
Sodium: 137 mEq/L (ref 135–145)

## 2013-03-08 LAB — CBC
HCT: 31.2 % — ABNORMAL LOW (ref 36.0–46.0)
Hemoglobin: 9.7 g/dL — ABNORMAL LOW (ref 12.0–15.0)
RBC: 3.52 MIL/uL — ABNORMAL LOW (ref 3.87–5.11)
WBC: 8.7 10*3/uL (ref 4.0–10.5)

## 2013-03-08 MED ORDER — ENSURE PUDDING PO PUDG: 1.0000 | Freq: Two times a day (BID) | ORAL | Status: DC

## 2013-03-08 MED ORDER — METOPROLOL TARTRATE 50 MG PO TABS
50.0000 mg | ORAL_TABLET | Freq: Two times a day (BID) | ORAL | Status: DC
  Administered 2013-03-08: 10:00:00 50 mg via ORAL
  Filled 2013-03-08 (×2): qty 1

## 2013-03-08 MED ORDER — RIVAROXABAN 15 MG PO TABS: 15.0000 mg | ORAL_TABLET | Freq: Two times a day (BID) | ORAL | Status: DC

## 2013-03-08 MED ORDER — PREGABALIN 100 MG PO CAPS: ORAL_CAPSULE | ORAL | Status: AC

## 2013-03-08 MED ORDER — PREGABALIN 100 MG PO CAPS: 100.0000 mg | ORAL_CAPSULE | Freq: Two times a day (BID) | ORAL | Status: DC

## 2013-03-08 MED ORDER — LISINOPRIL 2.5 MG PO TABS: 2.5000 mg | ORAL_TABLET | Freq: Every day | ORAL | Status: AC

## 2013-03-08 MED ORDER — HYDROCODONE-ACETAMINOPHEN 7.5-325 MG PO TABS: 1.0000 | ORAL_TABLET | Freq: Three times a day (TID) | ORAL | Status: DC | PRN

## 2013-03-08 MED ORDER — METOPROLOL TARTRATE 50 MG PO TABS: 50.0000 mg | ORAL_TABLET | Freq: Two times a day (BID) | ORAL | Status: DC

## 2013-03-08 MED ORDER — FUROSEMIDE 40 MG PO TABS: 40.0000 mg | ORAL_TABLET | Freq: Two times a day (BID) | ORAL | Status: AC

## 2013-03-08 MED ORDER — POTASSIUM CHLORIDE CRYS ER 20 MEQ PO TBCR: 40.0000 meq | EXTENDED_RELEASE_TABLET | Freq: Every day | ORAL | Status: AC

## 2013-03-08 MED ORDER — RIVAROXABAN 20 MG PO TABS: 20.0000 mg | ORAL_TABLET | Freq: Every day | ORAL | Status: AC

## 2013-03-08 MED ORDER — GLUCERNA SHAKE PO LIQD: 237.0000 mL | ORAL | Status: DC

## 2013-03-08 MED ORDER — POTASSIUM CHLORIDE 10 MEQ/100ML IV SOLN
10.0000 meq | INTRAVENOUS | Status: AC
  Administered 2013-03-08 (×2): 10 meq via INTRAVENOUS
  Filled 2013-03-08 (×2): qty 100

## 2013-03-08 MED ORDER — ALPRAZOLAM 1 MG PO TABS: ORAL_TABLET | ORAL | Status: DC

## 2013-03-08 MED ORDER — ALPRAZOLAM 1 MG PO TABS: 1.0000 mg | ORAL_TABLET | Freq: Three times a day (TID) | ORAL | Status: AC | PRN

## 2013-03-08 MED ORDER — ATORVASTATIN CALCIUM 10 MG PO TABS: 10.0000 mg | ORAL_TABLET | Freq: Every day | ORAL | Status: AC

## 2013-03-08 MED ORDER — HYDROCODONE-ACETAMINOPHEN 7.5-325 MG PO TABS: 1.0000 | ORAL_TABLET | Freq: Three times a day (TID) | ORAL | Status: AC | PRN

## 2013-03-08 MED ORDER — ZOLPIDEM TARTRATE 5 MG PO TABS: 5.0000 mg | ORAL_TABLET | Freq: Every evening | ORAL | Status: DC | PRN

## 2013-03-08 NOTE — Discharge Summary (Addendum)
Physician Discharge Summary  [name] EAV:[id number] DOB: [birth year] DOA: 02/23/2013  PCP: Pcp Not In System  Admit date: 02/23/2013 Discharge date: 03/08/2013  Recommendations for Outpatient Follow-up:  1. Pt will need to follow up with PCP in 2 weeks post discharge 2. Please obtain BMP to evaluate electrolytes and kidney function on 03/14/13 3. Please also check CBC to evaluate Hg and Hct levels 4. Follow up with cardiology, Dr. Tonny Bollman in 3-4 weeks 5. Follow up with GI, Dr. Carman Ching in one month  Discharge Diagnoses:  Active Problems:   Acute respiratory failure with hypoxia   NSTEMI (non-ST elevated myocardial infarction)   Shock circulatory   Pulmonary embolism   Coronary atherosclerosis of native coronary artery Brief history  67 year old female with hypertension, fibromyalgia, diverticulitis presented to the ED with three-day history of worsening abdominal pain, nausea, vomiting, diarrhea, and hematochezia. The patient was noted to be hypotensive with a blood pressure of 70's/30's. PCCM was consulted and the patient was admitted to the ICU. The patient was placed on vasopressors and given intravenous fluids. Gastroenterology was consulted and recommended a CT of the abdomen and pelvis in addition to possibly an unprepped colonoscopy and upper endoscopy. CT of the abdomen and pelvis revealed a dilated gallbladder without any wall thickening or dilated ducts. On 02/25/2013, the patient developed chest pain and left arm pain. Her troponin was noted to be elevated and she developed acute respiratory failure with pulmonary edema. Her endoscopy/sigmoidoscopy was canceled. 02/26/2013 CT angiography chest revealed right lower lobe pulmonary embolus. The patient was started on heparin drip. Cardiology was consulted and was concerned about NSTEMI vs Takotsubo's cardiomyopathy. 02/26/2013 the echocardiogram showed severe hypokinesis of the distal anterolateral septum with grade 3  diastolic dysfunction. The patient was started on intravenous furosemide and diuresed. The patient was extubated on 03/03/2013. The patient was transitioned to the medical floor on 03/04/2013. Currently cardiology plans for cardiac catheterization to clarify obstructive coronary versus Takatsubo's cardiomyopathy. Cardiac catheterization was performed on 03/07/2013. See below for results. The patient was transitioned to rivaroxaban. Her heparin drip was discontinued. Assessment/Plan:  Acute respiratory failure  -Multifactorial including pulmonary edema and pulmonary embolus  -Currently stable on room air  Pulmonary embolus  -heparin drip was initiated -02/26/2013 CTA of the chest revealed right lower lobe pulmonary embolus with bilateral pleural effusions and mild pulmonary edema  -appears unprovoked  -Heparin drip was discontinued on 03/07/2013. -Rivaroxaban was started 15 mg twice a day on the evening of 03/07/2013. -The patient will continue rivaroxaban 15 mg twice a day through 03/27/2013 -Thereafter, the patient will start rivaroxaban 20 mg daily started on 03/28/2013. -The risks, benefits, and alternatives of rivaroxaban versus warfarin were discussed with the patient. She expressed understanding and felt that rivaroxaban was a better option for her given her diet as well as difficulty getting to her physician to have her INR monitored. Acute systolic and diastolic CHF  -02/27/2003 Echo--EF not mentioned the patient had severe HK of mid-to distal anterolateral septum and anterior myocardium. There was akinesis of the inferior septal myocardium, moderate HK of apex, grade 3 diastolic dysfunction  -Cardiology is following  -Cardiac catheterization 03/07/2013--moderate LAD stenosis, minor nonobstructive LCx and RCA stenosis -Medical therapy was recommended -EF was noted to be 55-65% on the heart catheterization -Therefore it was thought that the patient's clinical scenario was more suggestive  of Takotsubo's cardiomyopathy as her LV function has recovered. -Continue aspirin, furosemide, Lipitor  - continue oral furosemide--maintain even balance I/O  -neg  7.8 liters for the admission  -Patient will continue furosemide 40 mg twice a day with daily potassium supplementation -She will continue on metoprolol tartrate 50 mg twice a day. NSTEMI  -Heparin drip continued in the setting of pulmonary embolus  -Continue lisinopril  -appreciate cards -Continue Lipitor 10 mg daily. Acute kidney injury  -Resolved  -Secondary to hypotension and hypovolemia -Serum creatinine 0.51 on the day of discharge  Diarrhea  -Patient has chronic loose stools in the setting of IBS  -Stool pathogen panel negative, C. difficile PCR negative  Hypothyroidism  -Continue Synthroid  -TSH 3.945  Fibromyalgia  -Continue Lyrica  Hyperglycemia/impaired glucose intolerance  -Continue NovoLog sliding scale  -Hemoglobin A1c 6.1  - lifestyle modification should be undertaken.-   Discharge Condition: Stable  Disposition: Skilled nursing facility Follow-up Information   Follow up with Tonny Bollman, MD In 3 weeks.   Specialty:  Cardiology   Contact information:   [address] [place] [address] [phone]       Follow up with Vertell Novak, MD In 1 month.   Specialty:  Gastroenterology   Contact information:   [address]                         [place] [address] [phone]       Diet: Heart healthy  Wt Readings from Last 3 Encounters:  03/08/13 80.3 kg (177 lb 0.5 oz)  03/08/13 80.3 kg (177 lb 0.5 oz)  03/08/13 80.3 kg (177 lb 0.5 oz)      Consultants: [facility] CCM  Discharge Exam: Filed Vitals:   03/08/13 0518  BP: 128/59  Pulse: 97  Temp: 98.4 F (36.9 C)  Resp: 19   Filed Vitals:   03/07/13 0915 03/07/13 1444 03/07/13 2026 03/08/13 0518  BP:  129/55 125/53 128/59  Pulse: 90 94 103 97  Temp:  98.3  F (36.8 C) 98.2 F (36.8 C) 98.4 F (36.9 C)  TempSrc:  Oral Oral Oral  Resp:  18 18 19   Height:      Weight:    80.3 kg (177 lb 0.5 oz)  SpO2:  99% 96% 100%   General: A&O x 3, NAD, pleasant, cooperative Cardiovascular: RRR, no rub, no gallop, no S3 Respiratory: CTAB, no wheeze, no rhonchi Abdomen:soft, nontender, nondistended, positive bowel sounds Extremities: trace edema, No lymphangitis, no petechiae  Discharge Instructions      Discharge Orders   Future Orders Complete By Expires   Diet - low sodium heart healthy  As directed    Increase activity slowly  As directed        Medication List         ALPRAZolam 1 MG tablet  Commonly known as:  XANAX  Take 1 tablet (1 mg total) by mouth 3 (three) times daily as needed for anxiety.     atorvastatin 10 MG tablet  Commonly known as:  LIPITOR  Take 1 tablet (10 mg total) by mouth daily at 6 PM.     BENADRYL ALLERGY/SINUS PO  Take 1 tablet by mouth 2 (two) times daily as needed (for allergies).     feeding supplement Pudg  Take 1 Container by mouth 2 (two) times daily after a meal.     feeding supplement Liqd  Take 237 mLs by mouth daily.     furosemide 40 MG tablet  Commonly known as:  LASIX  Take 1 tablet (  40 mg total) by mouth 2 (two) times daily.     HYDROcodone-acetaminophen 7.5-325 MG per tablet  Commonly known as:  NORCO  Take 1 tablet by mouth every 8 (eight) hours as needed for pain (pain).     lisinopril 2.5 MG tablet  Commonly known as:  PRINIVIL,ZESTRIL  Take 1 tablet (2.5 mg total) by mouth daily.     loperamide 2 MG capsule  Commonly known as:  IMODIUM  Take 2 mg by mouth 2 (two) times daily as needed for diarrhea or loose stools.     metoprolol 50 MG tablet  Commonly known as:  LOPRESSOR  Take 1 tablet (50 mg total) by mouth 2 (two) times daily.     potassium chloride SA 20 MEQ tablet  Commonly known as:  K-DUR,KLOR-CON  Take 2 tablets (40 mEq total) by mouth daily.     pregabalin 100  MG capsule  Commonly known as:  LYRICA  Take 1 capsule (100 mg total) by mouth 2 (two) times daily.     Rivaroxaban 15 MG Tabs tablet  Commonly known as:  XARELTO  Take 1 tablet (15 mg total) by mouth 2 (two) times daily with a meal. Take through 03/27/13     Rivaroxaban 20 MG Tabs tablet  Commonly known as:  XARELTO  Take 1 tablet (20 mg total) by mouth daily with supper. Start 03/28/13  Start taking on:  03/21/2013     zolpidem 5 MG tablet  Commonly known as:  AMBIEN  Take 1 tablet (5 mg total) by mouth at bedtime as needed for sleep.         The results of significant diagnostics from this hospitalization (including imaging, microbiology, ancillary and laboratory) are listed below for reference.    Significant Diagnostic Studies: Ct Abdomen Pelvis Wo Contrast  02/23/2013   *RADIOLOGY REPORT*  Clinical Data: Diarrhea and abdominal pain  CT ABDOMEN AND PELVIS WITHOUT CONTRAST  Technique:  Multidetector CT imaging of the abdomen and pelvis was performed following the standard protocol without intravenous contrast.  Comparison: 10/14/2012  Findings: The lung bases are free of acute infiltrate or sizable effusion.  The liver and spleen are stable in appearance from prior exam.  The gallbladder is significantly dilated however no pericholecystic fluid or wall thickening is noted.  No definitive cholelithiasis is seen.  No extrahepatic biliary ductal dilatation is noted.  The adrenal glands and pancreas are within normal limits.  The kidneys are well visualized bilaterally.  No renal calculi or obstructive changes are noted.  A Foley catheter is decompressed the bladder.  The appendix is not well visualized may been surgically removed.  Postsurgical changes in the stomach are again seen.  IMPRESSION: Dilated gallbladder although no wall thickening or pericholecystic fluid is noted.  No cholelithiasis is seen.  No acute abnormality is noted.   Original Report Authenticated By: Alcide Clever, M.D.    Dg Abd 1 View  02/26/2013   *RADIOLOGY REPORT*  Clinical Data: Status post nasogastric catheter placement  ABDOMEN - 1 VIEW  Comparison: 02/23/2013  Findings: A nasogastric catheter is now noted within the mid stomach.  Scattered large and small bowel gas is noted.  No definitive obstructive changes are seen.  The osseous structures are stable.  IMPRESSION: Nasogastric catheter within the stomach.   Original Report Authenticated By: Alcide Clever, M.D.   Dg Abd 1 View  02/23/2013   *RADIOLOGY REPORT*  Clinical Data: Abdominal pain, weakness  ABDOMEN - 1 VIEW  Comparison: CT  abdomen pelvis - 10/14/2012  Findings:  There is mild nonspecific gaseous distension of a loop of small bowel within the midhemiabdomen measuring approximately 4.3 cm in diameter.  This finding is associated with a paucity of distal colonic gas.  No supine evidence of pneumoperitoneum.  No definite pneumatosis or portal venous gas.  No definite abnormal intra-abdominal calcifications.  Multilevel thoracolumbar spine degenerative change suspected.  IMPRESSION: Mild gaseous distension of a loop of small bowel left mid hemiabdomen, nonspecific but could be seen in the setting of early small bowel obstruction.  Clinical correlation is advised.   Original Report Authenticated By: Tacey Ruiz, MD   Ct Angio Chest Pe W/cm &/or Wo Cm  02/26/2013   *RADIOLOGY REPORT*  Clinical Data: Short of breath.  Rule out pulmonary embolism. Respiratory distress  CT ANGIOGRAPHY CHEST  Technique:  Multidetector CT imaging of the chest using the standard protocol during bolus administration of intravenous contrast. Multiplanar reconstructed images including MIPs were obtained and reviewed to evaluate the vascular anatomy.  Contrast: OMNIPAQUE IOHEXOL 350 MG/ML SOLN  Comparison: Chest x-ray 02/25/2013  Findings: Image quality limited by motion.  The patient was not able to hold still.  The patient is intubated.  There are bilateral pleural effusions and  bibasilar dependent atelectasis.  Small filling defect is present in the pulmonary artery supplying the superior segment of the right lower lobe.  This is best seen on axial thin sections and is seen on multiple images.  No other pulmonary emboli are identified.  Coronary calcification.  Heart size within normal limits.  Mild airspace disease bilaterally consistent with residual pulmonary edema.  No acute bony change.  IMPRESSION: Small segmental pulmonary embolism to the superior segment right lower lobe.  Bilateral effusions and bibasilar atelectasis.  Mild pulmonary edema.  Critical Value/emergent results were called by telephone at the time of interpretation on 02/26/2013 at 1223 hours to Dr. Maple Hudson, who verbally acknowledged these results.   Original Report Authenticated By: Janeece Riggers, M.D.   US Abdomen Port  02/24/2013   *RADIOLOGY REPORT*  Clinical Data:  Markedly distended gallbladder on CT scan of 02/23/2013. Bilirubin is not elevated.  COMPLETE ABDOMINAL ULTRASOUND  Comparison:  CT scan dated 02/23/2013 and ultrasound dated 10/16/2012  Findings:  Gallbladder:  No gallstones, gallbladder wall thickening, or pericholecystic fluid. The gallbladder is distended.  Negative sonographic Murphy's sign.  Common bile duct:  Common bile duct is 11 mm in diameter, unchanged since 10/16/2012.  No dilated intrahepatic bile ducts.  Liver:  No focal lesion identified.  Within normal limits in parenchymal echogenicity.  IVC:  Normal.  Pancreas:  Head and body of the pancreas are normal.  Tail is obscured by bowel.  Spleen:  Normal.  Formal 5 cm in length.  Right Kidney:  Normal.  10.8 cm in length.  Left Kidney:  Normal.  10.0 cm in length.  Abdominal aorta:  Normal.  2.1 cm in diameter.  IMPRESSION: The gallbladder is distended but there are no stones and the wall is not thickened. The gallbladder was distended on the prior ultrasound of 10/16/2012 but not to quite the same degree. There is chronic dilatation of the  common bile duct but there are no dilated intrahepatic bile ducts.  The patient's bilirubin is not elevated.   Original Report Authenticated By: Francene Boyers, M.D.   Dg Chest Port 1 View  03/04/2013   *RADIOLOGY REPORT*  Clinical Data: Follow up atelectasis  PORTABLE CHEST - 1 VIEW  Comparison: Prior  chest x-ray 03/03/2013  Findings: Interval extubation and removal of nasogastric tube. Left IJ approach central venous catheter remains in unchanged position with the tip in the distal left innominate vein. The patient is rotated toward the right.  Improving low lung volumes and bibasilar atelectasis.  The lungs are otherwise clear.  Stable mild cardiomegaly.  IMPRESSION:  1.  Improving inspiratory volumes and decreased bibasilar atelectasis. 2.  Interval extubation and removal of the nasogastric tube.   Original Report Authenticated By: Malachy Moan, M.D.   Dg Chest Port 1 View  03/03/2013   CLINICAL DATA:  Acute respiratory failure with hypoxia. Myocardial infarct.  EXAM: PORTABLE CHEST - 1 VIEW  COMPARISON:  03/02/2013  FINDINGS: Support apparatus remains in appropriate position. Low lung volumes again noted, however both lungs are grossly clear. Tiny left pleural effusion 1st pleural thickening noted. Heart size is normal.  IMPRESSION: Low lung volumes. No acute findings.   Electronically Signed   By: Myles Rosenthal   On: 03/03/2013 07:18   Dg Chest Port 1 View  03/02/2013   *RADIOLOGY REPORT*  Clinical Data: CHF  PORTABLE CHEST - 1 VIEW  Comparison: Prior radiograph from 03/01/2013  Located 4.9 cm above the carina.  Enteric tube courses into the abdomen.  Cardiomegaly not significantly changed.  Allowing for patient rotation, diffuse prominence of the interstitial markings and pulmonary vascular congestion is grossly similar as compared to the prior exam. A left pleural effusion is slightly improved.  Dense retrocardiac left lower lobe opacity persists, likely atelectasis and / or effusion.  No  pneumothorax.  Osseous structures are unchanged.  IMPRESSION: 1. Tip of the endotracheal tube 4.9 cm above the carina.  Remaining support apparatus in stable position. 2.  Persistent pulmonary edema with slightly improved left pleural effusions. 3.  Persistent dense retrocardiac left lower lobe opacity, likely a combination of atelectasis and effusion.   Original Report Authenticated By: Rise Mu, M.D.   Dg Chest Port 1 View  03/01/2013   *RADIOLOGY REPORT*  Clinical Data: Check ETT  PORTABLE CHEST - 1 VIEW  Comparison: 02/28/2013  Findings: Left basilar opacity, likely a combination of atelectasis and small pleural effusion.  Mild pulmonary vascular congestion without frank interstitial edema.  Mild cardiomegaly.  Endotracheal tube terminates 4.5 cm above the carina.  Enteric tube courses below the diaphragm.  Left IJ venous catheter terminates in the left brachiocephalic vein.  IMPRESSION: Endotracheal tube terminates 4.5 cm above the carina.  Left basilar opacity, likely a combination of atelectasis and small pleural effusion.  Additional support apparatus as above.   Original Report Authenticated By: Charline Bills, M.D.   Dg Chest Port 1 View  02/28/2013   *RADIOLOGY REPORT*  Clinical Data: Respiratory failure  PORTABLE CHEST - 1 VIEW  Comparison: 02/27/2013  Findings: The cardiac shadow is stable.  An endotracheal tube, left jugular central line and nasogastric catheter are all stable in appearance.  Mild left basilar atelectasis is noted.  Mild central vascular congestion is noted as well.  The overall appearance however is improved in the interval from prior exam.  IMPRESSION: Improved vascular congestion.  Persistent left basilar changes are seen.   Original Report Authenticated By: Alcide Clever, M.D.   Dg Chest Port 1 View  02/27/2013   *RADIOLOGY REPORT*  Clinical Data: 67 year old female with respiratory failure - endotracheal tube retracted.  PORTABLE CHEST - 1 VIEW  Comparison:  02/27/2013  Findings: The endotracheal tube has been slightly retracted with tip now 2.1 cm above the  carina - satisfactory. An NG tube entering the stomach and left IJ central venous catheter with tip overlying the medial left brachiocephalic vein again identified. Pulmonary vascular congestion, left upper and lower lobe atelectasis, mild pulmonary edema and probable small effusions again noted. There is no evidence of pneumothorax.  IMPRESSION: Slight interval retraction of endotracheal tube now with tip in satisfactory position.  Continued scattered areas of left lung atelectasis, pulmonary vascular congestion/edema and effusions.   Original Report Authenticated By: Harmon Pier, M.D.   Dg Chest Port 1 View  02/27/2013   *RADIOLOGY REPORT*  Clinical Data: Evaluate endotracheal tube placement.  PORTABLE CHEST - 1 VIEW  Comparison: Chest x-ray 02/25/2013.  Findings: Endotracheal tube is in the low position with tip at the carina directed slightly toward the left mainstem bronchus. There is a left-sided internal jugular central venous catheter with tip terminating in the proximal superior vena cava. A nasogastric tube is seen extending into the stomach, however, the tip of the nasogastric tube extends below the lower margin of the image.  The lung volumes are low.  Worsening opacity in the left base, favored to reflect left lower lobe atelectasis, although some underlying air space consolidation is not excluded.  Possible small left pleural effusion. There is cephalization of the pulmonary vasculature and slight indistinctness of the interstitial markings suggestive of mild pulmonary edema.  Heart size is borderline large.  Upper mediastinal contours are within normal limits.  IMPRESSION: 1.  Support apparatus, as above.  Low position of endotracheal tube which is at the carina directed toward the left mainstem bronchus. This should be withdrawn 4 cm for more optimal placement. 2.  Interval development of probable  left lower lobe atelectasis. 3.  Possible small left pleural effusion.  Mild interstitial pulmonary edema.  Critical Value/emergent results were called by telephone at the time of interpretation on 02/27/2013 at 07:45 a.m. to nurse Marylene Land (for Dr. Marchelle Gearing), who verbally acknowledged these results.   Original Report Authenticated By: Trudie Reed, M.D.   Dg Chest Port 1 View  02/26/2013   CLINICAL DATA:  Intubation.  EXAM: PORTABLE CHEST - 1 VIEW  COMPARISON:  02/23/2013  FINDINGS: Left central line remains in stable position. Endotracheal tube has been placed and is 2 cm above the carinal. Bilateral perihilar airspace opacities, slightly more pronounced in the upper lobes. This could represent edema, possibly negative pressure edema. The heart is normal size. No effusions. No acute bony abnormality.  IMPRESSION: Endotracheal tube 2 cm above the carinal.  New perihilar and upper lobe opacities, question edema.   Electronically Signed   By: Charlett Nose   On: 02/26/2013 00:06   Dg Chest Portable 1 View  02/23/2013   *RADIOLOGY REPORT*  Clinical Data: Neck pain.  Diarrhea.  Abdominal pain.  Rectal bleeding.  PORTABLE CHEST - 1 VIEW  Comparison: Chest x-ray 03/29/2009.  Findings: There is a left-sided internal jugular central venous catheter with tip terminating in the proximal superior vena cava. Lung volumes are slightly low.  No acute consolidative air space disease.  No pleural effusions.  No evidence of pulmonary edema. Heart size is within normal limits.  Upper mediastinal contours are within normal limits for the portable supine technique.  No pneumothorax.  IMPRESSION: 1.  No radiographic evidence of acute cardiopulmonary disease. 2.  Left internal jugular central venous catheter with tip in the proximal superior vena cava.   Original Report Authenticated By: Trudie Reed, M.D.     Microbiology: Recent Results (from the past  240 hour(s))  STOOL CULTURE     Status: None   Collection Time     02/27/13 10:18 AM      Result Value Range Status   Specimen Description STOOL   Final   Special Requests NONE   Final   Culture     Final   Value: NO SALMONELLA, SHIGELLA, CAMPYLOBACTER, YERSINIA, OR E.COLI [id number]:H7 ISOLATED     Note: REDUCED NORMAL FLORA PRESENT     Performed at [facility]   Report Status 03/03/2013 FINAL   Final     Labs: Basic Metabolic Panel:  Recent Labs Lab 03/02/13 0530 03/03/13 0520 03/04/13 0515 03/05/13 0425 03/06/13 0545 03/08/13 0450  NA 145 148* 146* 143 138 137  K 4.1 3.5 3.4* 3.5 3.5 3.0*  CL 110 109 105 100 99 101  CO2 29 32 35* 36* 32 30  GLUCOSE 159* 187* 133* 166* 121* 117*  BUN 22 29* 23 18 11 7   CREATININE 0.64 0.69 0.63 0.57 0.52 0.51  CALCIUM 8.5 8.8 9.0 9.2 8.5 8.6  MG 1.8  --   --   --   --   --    Liver Function Tests: No results found for this basename: AST, ALT, ALKPHOS, BILITOT, PROT, ALBUMIN,  in the last 168 hours No results found for this basename: LIPASE, AMYLASE,  in the last 168 hours No results found for this basename: AMMONIA,  in the last 168 hours CBC:  Recent Labs Lab 03/02/13 0530 03/03/13 0520 03/05/13 0425 03/06/13 0545 03/07/13 0500 03/08/13 0450  WBC 17.5* 13.3* 16.4* 11.4* 9.8 8.7  NEUTROABS 13.4*  --   --   --   --   --   HGB 9.7* 9.6* 10.2* 9.6* 10.0* 9.7*  HCT 31.6* 31.4* 34.5* 31.7* 32.7* 31.2*  MCV 88.0 89.5 91.0 89.0 88.1 88.6  PLT 307 268 339 290 281 330   Cardiac Enzymes: No results found for this basename: CKTOTAL, CKMB, CKMBINDEX, TROPONINI,  in the last 168 hours BNP: No components found with this basename: POCBNP,  CBG:  Recent Labs Lab 03/06/13 2120 03/07/13 0746 03/07/13 1701 03/07/13 2056 03/08/13 0751  GLUCAP 97 127* 150* 137* 115*    Time coordinating discharge:  Greater than 30 minutes  Signed:  Zaniyah Wernette, DO [facility] Pager: [phone] 03/08/2013, 10:30 AM

## 2013-03-08 NOTE — Progress Notes (Signed)
Patient is set to discharge to [facility] today. Patient & husband at bedside aware. Discharge packet in [place]. [facility]AR scheduled for 2:15p pickup (Service Request Id: [id number]).   Clinical Social Work Department CLINICAL SOCIAL WORK PLACEMENT NOTE 03/08/2013  Patient:  [name], [name]  Account Number:  [id number] Admit date:  02/23/2013  Clinical Social Worker:  Orpah Greek  Date/time:  03/07/2013 04:34 PM  Clinical Social Work is seeking post-discharge placement for this patient at the following level of care:   SKILLED NURSING   (*CSW will update this form in Epic as items are completed)   03/05/2013  Patient/family provided with [facility] Department of Clinical Social Work's list of facilities offering this level of care within the geographic area requested by the patient (or if unable, by the patient's family).  03/05/2013  Patient/family informed of their freedom to choose among providers that offer the needed level of care, that participate in Medicare, Medicaid or managed care program needed by the patient, have an available bed and are willing to accept the patient.  03/05/2013  Patient/family informed of [facility]' ownership interest in [facility], as well as of the fact that they are under no obligation to receive care at this facility.  PASARR submitted to EDS on 03/05/2013 PASARR number received from EDS on 03/05/2013  FL2 transmitted to all facilities in geographic area requested by pt/family on  03/07/2013 FL2 transmitted to all facilities within larger geographic area on   Patient informed that his/her managed care company has contracts with or will negotiate with  certain facilities, including the following:     Patient/family informed of bed offers received:  03/07/2013 Patient chooses bed at [facility] Physician recommends and patient chooses bed at    Patient to be transferred to [facility] on   03/08/2013 Patient to be transferred to facility by PTAR  The following physician request were entered in Epic:   Additional Comments:   Unice Bailey, LCSW [facility] Clinical Social Worker cell #: [phone]

## 2013-03-08 NOTE — Progress Notes (Signed)
Attempted to call report to [facility] facility.  No one answering phone to take report. Setzer, Don Broach

## 2013-03-08 NOTE — Progress Notes (Signed)
SUBJECTIVE: Only c/o headache and weakness in legs. No chest pain or SOB  BP 128/59  Pulse 97  Temp(Src) 98.4 F (36.9 C) (Oral)  Resp 19  Ht 5\' 2"  (1.575 m)  Wt 177 lb 0.5 oz (80.3 kg)  BMI 32.37 kg/m2  SpO2 100%  Intake/Output Summary (Last 24 hours) at 03/08/13 0915 Last data filed at 03/08/13 0700  Gross per 24 hour  Intake 988.65 ml  Output   2200 ml  Net -1211.35 ml    PHYSICAL EXAM General: Well developed, well nourished, in no acute distress. Alert and oriented x 3.  Psych:  Good affect, responds appropriately Neck: No JVD. No masses noted.  Lungs: Clear bilaterally with no wheezes or rhonci noted.  Heart: RRR with no murmurs noted. Abdomen: Bowel sounds are present. Soft, non-tender.  Extremities: No lower extremity edema.   LABS: Basic Metabolic Panel:  Recent Labs  40/98/11 0545 03/08/13 0450  NA 138 137  K 3.5 3.0*  CL 99 101  CO2 32 30  GLUCOSE 121* 117*  BUN 11 7  CREATININE 0.52 0.51  CALCIUM 8.5 8.6   CBC:  Recent Labs  03/07/13 0500 03/08/13 0450  WBC 9.8 8.7  HGB 10.0* 9.7*  HCT 32.7* 31.2*  MCV 88.1 88.6  PLT 281 330    Current Meds: . antiseptic oral rinse  15 mL Mouth Rinse QID  . aspirin  81 mg Oral Daily  . atorvastatin  10 mg Oral q1800  . chlorhexidine  15 mL Mouth Rinse BID  . feeding supplement  1 Container Oral BID PC  . feeding supplement  237 mL Oral Q24H  . furosemide  40 mg Oral BID  . insulin aspart  0-20 Units Subcutaneous TID WC  . lisinopril  2.5 mg Oral Daily  . metoprolol tartrate  50 mg Oral BID  . multivitamin with minerals  1 tablet Oral Daily  . pantoprazole  40 mg Oral Daily  . potassium chloride  10 mEq Intravenous Q1 Hr x 2  . potassium chloride  40 mEq Oral Daily  . pregabalin  100 mg Oral BID  . rivaroxaban  15 mg Oral BID WC   Followed by  . [START ON 03/21/2013] rivaroxaban  20 mg Oral Q supper  . saccharomyces boulardii  250 mg Oral BID  . sodium chloride  3 mL Intravenous Q12H    Cardiac cath 03/07/13: Left mainstem: Mild calcification, no obstructive disease noted.  Left anterior descending (LAD): The proximal and mid-LAD are heavily calcified. The vessel has 50% stenosis just before the first diagonal branch. Otherwise there is minor irregularity of the LAD noted without significant stenosis.  Left circumflex (LCx): The LCx is a large vessel without obstructive disease. The mid-circumflex has 30% stenosis. There is a large first OM without significant disease.  Right coronary artery (RCA): The RCA is a large, dominant vessel. There is heavy diffuse calcification. There are diffuse luminal irregularities without associated significant stenoses. The PDA and PLA branches are patent.  Left ventriculography: Left ventricular systolic function is normal, LVEF is estimated at 55-65%, there is no significant mitral regurgitation  Final Conclusions:  1. Moderate LAD stenosis  2. Minor nonobstructive LCx and RCA stenoses  3. Normal LV function  ASSESSMENT AND PLAN:  1. Cardiomyopathy: Resolved. Likely stress induced cardiomyopathy. Cardiac cath yesterday with non-obstructive CAD. Continue beta blocker,Ace-inh, statin, ASA.   2. PE: Per primary team. She has been started on Xarelto for anti-coagulation.  She can f/u in our office with Dr. Tonny Bollman in our [facility] office in  3-4 weeks. Will sign off. Please call with questions.   Lynita Groseclose  9/9/20149:15 AM

## 2013-03-08 NOTE — Progress Notes (Signed)
OT Cancellation Note  Patient Details Name: [name] MRN: [id number] DOB: [birth year]   Cancelled Treatment:    Reason Eval/Treat Not Completed: Other (comment). Pt supposed to d/c SNF today. Will defer eval to SNF unless here after today.  Lennox Laity [phone] 03/08/2013, 11:58 AM

## 2013-03-09 ENCOUNTER — Non-Acute Institutional Stay (SKILLED_NURSING_FACILITY): Payer: Medicare Other | Admitting: Internal Medicine

## 2013-03-09 DIAGNOSIS — I509 Heart failure, unspecified: Secondary | ICD-10-CM

## 2013-03-09 DIAGNOSIS — I2699 Other pulmonary embolism without acute cor pulmonale: Secondary | ICD-10-CM

## 2013-03-09 DIAGNOSIS — I2510 Atherosclerotic heart disease of native coronary artery without angina pectoris: Secondary | ICD-10-CM

## 2013-03-09 DIAGNOSIS — E039 Hypothyroidism, unspecified: Secondary | ICD-10-CM

## 2013-03-15 ENCOUNTER — Non-Acute Institutional Stay (SKILLED_NURSING_FACILITY): Payer: Medicare Other | Admitting: Internal Medicine

## 2013-03-15 DIAGNOSIS — R109 Unspecified abdominal pain: Secondary | ICD-10-CM

## 2013-03-15 DIAGNOSIS — D638 Anemia in other chronic diseases classified elsewhere: Secondary | ICD-10-CM

## 2013-03-18 ENCOUNTER — Other Ambulatory Visit: Payer: Self-pay | Admitting: *Deleted

## 2013-03-18 MED ORDER — ZOLPIDEM TARTRATE 5 MG PO TABS: 5.0000 mg | ORAL_TABLET | Freq: Every evening | ORAL | Status: AC | PRN

## 2013-03-22 ENCOUNTER — Encounter: Payer: Self-pay | Admitting: Physician Assistant

## 2013-03-22 ENCOUNTER — Ambulatory Visit (INDEPENDENT_AMBULATORY_CARE_PROVIDER_SITE_OTHER): Payer: Medicare Other | Admitting: Physician Assistant

## 2013-03-22 VITALS — BP 110/72 | HR 105 | Wt 165.0 lb

## 2013-03-22 DIAGNOSIS — E785 Hyperlipidemia, unspecified: Secondary | ICD-10-CM

## 2013-03-22 DIAGNOSIS — R000 Tachycardia, unspecified: Secondary | ICD-10-CM

## 2013-03-22 DIAGNOSIS — I2699 Other pulmonary embolism without acute cor pulmonale: Secondary | ICD-10-CM

## 2013-03-22 DIAGNOSIS — I10 Essential (primary) hypertension: Secondary | ICD-10-CM

## 2013-03-22 DIAGNOSIS — I2510 Atherosclerotic heart disease of native coronary artery without angina pectoris: Secondary | ICD-10-CM

## 2013-03-22 DIAGNOSIS — I428 Other cardiomyopathies: Secondary | ICD-10-CM

## 2013-03-22 LAB — BASIC METABOLIC PANEL
BUN: 16 mg/dL (ref 6–23)
CO2: 29 mEq/L (ref 19–32)
Chloride: 97 mEq/L (ref 96–112)
Glucose, Bld: 93 mg/dL (ref 70–99)
Potassium: 3.8 mEq/L (ref 3.5–5.1)
Sodium: 133 mEq/L — ABNORMAL LOW (ref 135–145)

## 2013-03-22 NOTE — Progress Notes (Signed)
[address] [place], [place]  [address] Phone: [phone] Fax:  [phone]  Date:  03/22/2013   ID:  [name], DOB 02-Jun-1946, MRN [id number]  PCP:  Dr. Lerry Liner (phone [phone])  Cardiologist:  Dr. Tonny Bollman     History of Present Illness: [name] is a 67 y.o. female who returns for follow up after recent admission to the hospital 8/27-9/9  She has a h/o GI issues including chronic diarrhea in the setting of IBS.  She was initially admitted to the hospital with suspected gastroenteritis complicated by hypovolemic shock, acute renal failure (creatinine 7.23) and hyponatremia. She was treated with antibiotics and vasopressors. She improved and was set to undergo further workup. She then started complaining of chest pain and cardiac markers were positive for non-STEMI. She suddenly developed recurrent chest discomfort and dyspnea and respiratory failure. She was intubated chest x-ray demonstrated pulmonary edema. She was treated with IV Lasix. Chest CTA demonstrated small pulmonary embolus in the superior segment of the RLL. Echocardiogram 02/26/13 demonstrated multiple wall motion abnormalities with reduced LVF, grade 2 diastolic dysfunction and mild mitral regurgitation. She was seen by cardiology. She ultimately underwent cardiac catheterization. LHC 03/07/13: LAD 50%, mid circumflex 30%, RCA with luminal irregularities, EF 55-65%.  Clinical scenario was felt to be suggestive of Tako-Tsubo CM.  Ejection fraction was noted to have recovered. Medical therapy for CAD was recommended.  Patient was maintained on Xarelto for treatment for pulmonary embolism.  Of note, her creatinine was normal at discharge.  She was discharged to [facility]. She was there for about 2 weeks and was just discharged last night. She is now back at home. She is feeling much better. She does have an occasional flutter in her chest. Over the last couple days she has noted improvement. She  denies chest discomfort. She does note dyspnea with exertion. She is NYHA class IIb. She continues to be weak. This is slowly improving. She denies orthopnea, PND. She denies syncope. She does have mild ankle edema.  She has not taken any medications in today.  Labs (9/14):  K 3, creatinine 0.51, Hgb 9.7, TSH 3.945  Wt Readings from Last 3 Encounters:  03/22/13 165 lb (74.844 kg)  03/08/13 177 lb 0.5 oz (80.3 kg)  03/08/13 177 lb 0.5 oz (80.3 kg)     Past Medical History  Diagnosis Date  . IBS (irritable bowel syndrome)   . Diverticulitis   . Hypertension   . Fibromyalgia   . Systolic dysfunction, left ventricle     a. 01/2013 Echo: Sev HK of mid-dist antsept, ant, apical walls; mid-dist lat/inflat/inf/infsept AK, Gr 3 DD, Mild MR. => recovered EF at cath 9/14 (Tako-Tsubo CM)  . NSTEMI (non-ST elevated myocardial infarction)     a. 01/2013  . PE (pulmonary embolism)     a. 01/2013 CTA Chest: small segmental PE to the superior segment of RLL.  . Takotsubo cardiomyopathy     a. in setting of gastroenteritis c/b hypovolemic shock and ARF (9/14) => EF recovered at cath  . CAD (coronary artery disease)     a. LHC 03/07/13: LAD 50%, mid circumflex 30%, RCA with luminal irregularities, EF 55-65%  . Hyperlipidemia     Current Outpatient Prescriptions  Medication Sig Dispense Refill  . ALPRAZolam (XANAX) 1 MG tablet Take 1 tablet (1 mg total) by mouth 3 (three) times daily as needed for anxiety.  30 tablet  0  . ALPRAZolam (XANAX) 1 MG  tablet Take one tablet by mouth three times daily as needed for anxiety  90 tablet  5  . atorvastatin (LIPITOR) 10 MG tablet Take 1 tablet (10 mg total) by mouth daily at 6 PM.  30 tablet  0  . Diphenhydramine-Pseudoephed (BENADRYL ALLERGY/SINUS PO) Take 1 tablet by mouth 2 (two) times daily as needed (for allergies).       . feeding supplement (ENSURE) PUDG Take 1 Container by mouth 2 (two) times daily after a meal.    0  . furosemide (LASIX) 40 MG tablet Take 1  tablet (40 mg total) by mouth 2 (two) times daily.  60 tablet  0  . HYDROcodone-acetaminophen (NORCO) 7.5-325 MG per tablet Take 1 tablet by mouth every 8 (eight) hours as needed for pain (pain).  90 tablet  5  . lisinopril (PRINIVIL,ZESTRIL) 2.5 MG tablet Take 1 tablet (2.5 mg total) by mouth daily.  30 tablet  0  . loperamide (IMODIUM) 2 MG capsule Take 2 mg by mouth 2 (two) times daily as needed for diarrhea or loose stools.       . metoprolol (LOPRESSOR) 50 MG tablet Take 1 tablet (50 mg total) by mouth 2 (two) times daily.  60 tablet  0  . potassium chloride SA (K-DUR,KLOR-CON) 20 MEQ tablet Take 2 tablets (40 mEq total) by mouth daily.  60 tablet  0  . pregabalin (LYRICA) 100 MG capsule Take one capsule by mouth twice daily  60 capsule  5  . Rivaroxaban (XARELTO) 20 MG TABS tablet Take 1 tablet (20 mg total) by mouth daily with supper. Start 03/28/13  30 tablet  1  . zolpidem (AMBIEN) 5 MG tablet Take 1 tablet (5 mg total) by mouth at bedtime as needed for sleep.  30 tablet  5   No current facility-administered medications for this visit.    Allergies:   No Known Allergies  Social History:  The patient  reports that she has never smoked. She has never used smokeless tobacco. She reports that she drinks about 0.6 ounces of alcohol per week. She reports that she does not use illicit drugs.   ROS:  Please see the history of present illness.      All other systems reviewed and negative.   PHYSICAL EXAM: VS:  BP 110/72  Pulse 105  Wt 165 lb (74.844 kg)  BMI 30.17 kg/m2 Well nourished, well developed, in no acute distress HEENT: normal Neck: no JVD Cardiac:  normal S1, S2; RRR; no murmur Lungs:  clear to auscultation bilaterally, no wheezing, rhonchi or rales Abd: soft, nontender, no hepatomegaly Ext: no edema; right wrist without hematoma or mass  Skin: warm and dry Neuro:  CNs 2-12 intact, no focal abnormalities noted  EKG:  Sinus tachycardia, HR 105, lateral T wave inversions, no  significant change since prior tracings     ASSESSMENT AND PLAN:  1. Non-STEMI: This was in the setting of stress induced cardiomyopathy. Ejection fraction recovered at cardiac catheterization. She had nonobstructive disease. She is currently on Xarelto for treatment of her pulmonary embolism. 2. Tako-Tsubo Cardiomyopathy: Ejection fraction has recovered. Continue ACE inhibitor, beta blocker. Volume appears stable. Check a basic metabolic panel today. 3. CAD: No angina. She is not on aspirin as she is on Xarelto. Continue statin. 4. Hypertension: Controlled. 5. Hyperlipidemia: Continue statin. 6. Pulmonary Embolism: She remains on Xarelto. Duration of treatment will be per her primary care physician. 7. Tachycardia: She has not had any medications today and was just discharged  from the rehabilitation facility yesterday. Continue to monitor. Consider adjustments in her beta blocker as needed. She knows to contact us should she continue to have palpitations. 8. Disposition: Follow up with Dr. Excell Seltzer in 6 weeks.  Signed, Tereso Newcomer, PA-C  03/22/2013 9:37 AM

## 2013-03-22 NOTE — Patient Instructions (Addendum)
Your physician recommends that you continue on your current medications as directed. Please refer to the Current Medication list given to you today.  LABS TODAY: BMET  Your physician recommends that you schedule a follow-up appointment in: 6 WEEKS WITH DR.COOPER

## 2013-03-29 ENCOUNTER — Telehealth: Payer: Self-pay | Admitting: Cardiovascular Disease

## 2013-03-29 NOTE — Telephone Encounter (Signed)
I spoke with the pt and since she came home from rehab she has noticed swelling in her feet, ankles and lower legs.  The pt said she cannot wear shoes and is wearing her hospital socks. The pt said she does have some SOB when she really exerts herself but otherwise her breathing is normal. The pt does not weigh herself and does not have scales in her home. I made her aware of the importance of weighing on a daily basis. The pt said she does not consume foods high in sodium.  I also instructed the pt to elevate her legs when able. The pt is currently taking Furosemide 40mg  twice a day.  I will discuss this pt with Dr Excell Seltzer.  The pt also made me aware that she has not taken Xarelto in a week (since she left rehab facility).  The pt called her pharmacy today and they said the Rx was over $200/month.  The pt only gets a Radio producer and she cannot afford this medication.  I made the pt aware that I will have to talk with Dr Excell Seltzer and call her tomorrow about alternatives to this medication.

## 2013-03-29 NOTE — Telephone Encounter (Signed)
New Prob     Pt states her feet and lower extremities are swelling and would like to speak to a nurse regarding this. Please call.

## 2013-03-30 NOTE — Telephone Encounter (Signed)
Per Dr Excell Seltzer he does not recommend increasing diuretic at this time (EF normal on cardiac cath).  He does recommend elevating legs, weighing daily, decreasing salt consumption and wearing compression stockings.  Per Dr Excell Seltzer the pt's Xarelto was started due to PE and this should be managed by per PCP.    I spoke with the pt and made her aware of Dr Earmon Phoenix recommendations in regards to lower extremity edema and Xarelto.  The pt will contact Dr Vilinda Blanks office today to discuss treatment for PE and determine if medications will be changed.

## 2013-04-05 NOTE — Progress Notes (Signed)
Patient ID: [name], female   DOB: 1945-07-09, 67 y.o.   MRN: [id number]        HISTORY & PHYSICAL  DATE: 03/09/2013   FACILITY: [facility]  LEVEL OF CARE: SNF (31)  ALLERGIES:  No Known Allergies  CHIEF COMPLAINT:  Manage PE, hypothyroidism,and CAD.    HISTORY OF PRESENT ILLNESS:  The patient is a 67 year-old, Caucasian female who was hospitalized secondary to acute respiratory failure with hypoxemia.  After hospitalization, she is admitted to this facility for short-term rehabilitation.  She has the following problems:    CAD:  The patient was diagnosed with a non-ST elevation acute MI.  2D-echo showed severe hypokinesis of the distal anterolateral septum with grade 3 diastolic dysfunction.  The angina has been stable. The patient denies dyspnea on exertion, orthopnea, pedal edema, palpitations and paroxysmal nocturnal dyspnea. No complications noted from the medication presently being used.    PULMONARY EMBOLISM: The pulmonary embolism remains stable. Patient is currently on anticoagulation.  Patient denies chest pain or shortness of breath. No complications reported from the anti-coagulation currently being used.  The patient was started on anticoagulation in the hospital.    HYPOTHYROIDISM: The hypothyroidism remains stable. No complications noted from the medications presently being used.  The patient denies fatigue or constipation.  Last TSH:  3.945.    PAST MEDICAL HISTORY :  Past Medical History  Diagnosis Date  . IBS (irritable bowel syndrome)   . Diverticulitis   . Hypertension   . Fibromyalgia   . Systolic dysfunction, left ventricle     a. 01/2013 Echo: Sev HK of mid-dist antsept, ant, apical walls; mid-dist lat/inflat/inf/infsept AK, Gr 3 DD, Mild MR. => recovered EF at cath 9/14 (Tako-Tsubo CM)  . NSTEMI (non-ST elevated myocardial infarction)     a. 01/2013  . PE (pulmonary embolism)     a. 01/2013 CTA Chest: small segmental PE to the  superior segment of RLL.  . Takotsubo cardiomyopathy     a. in setting of gastroenteritis c/b hypovolemic shock and ARF (9/14) => EF recovered at cath  . CAD (coronary artery disease)     a. LHC 03/07/13: LAD 50%, mid circumflex 30%, RCA with luminal irregularities, EF 55-65%  . Hyperlipidemia     PAST SURGICAL HISTORY: Past Surgical History  Procedure Laterality Date  . Abdominal hysterectomy    . Stomach surgery      SOCIAL HISTORY:  reports that she has never smoked. She has never used smokeless tobacco. She reports that she drinks about 0.6 ounces of alcohol per week. She reports that she does not use illicit drugs.  FAMILY HISTORY:  Family History  Problem Relation Age of Onset  . Other Father     alive and well @ 73  . Other Paternal Grandmother     lived into her hundreds  . Other Mother     died of substance abuse issues.    CURRENT MEDICATIONS: Reviewed per [facility]  REVIEW OF SYSTEMS:  See HPI otherwise 14 point ROS is negative.  PHYSICAL EXAMINATION  VS:  T 97.2       P 92      RR 18      BP 111/67      POX%        WT (Lb)  GENERAL: no acute distress, moderately obese body habitus EYES: conjunctivae normal, sclerae normal, normal eye lids MOUTH/THROAT: lips without lesions,no lesions in the mouth,tongue is without lesions,uvula elevates  in midline NECK: supple, trachea midline, no neck masses, no thyroid tenderness, no thyromegaly LYMPHATICS: no LAN in the neck, no supraclavicular LAN RESPIRATORY: breathing is even & unlabored, BS CTAB CARDIAC: RRR, no murmur,no extra heart sounds EDEMA/VARICOSITIES:  +2 bilateral lower extremity edema  ARTERIAL:  pedal pulses nonpalpable   GI:  ABDOMEN: abdomen soft, normal BS, no masses, no tenderness  LIVER/SPLEEN: no hepatomegaly, no splenomegaly MUSCULOSKELETAL: HEAD: normal to inspection & palpation BACK: no kyphosis, scoliosis or spinal processes tenderness EXTREMITIES: LEFT UPPER EXTREMITY: strength intact, range of  motion moderate   RIGHT UPPER EXTREMITY: strength intact, range of motion moderate   LEFT LOWER EXTREMITY: strength decreased, range of motion minimal   RIGHT LOWER EXTREMITY: strength decreased, range of motion minimal   PSYCHIATRIC: the patient is alert & oriented to person, affect & behavior appropriate  LABS/RADIOLOGY: Hemoglobin A1c 6.1.    CT of the abdomen and pelvis:  No acute findings.    Abdominal x-ray:  Showed early small bowel obstruction.    CT angiogram of the chest:  Showed small segmental PE to the superior segment of right lower lobe, bilateral effusions.    Abdominal ultrasound:   Showed distended gallbladder, but no stones or wall thickening.      Chest x-ray:  Showed improved respiratory volume.    Stool culture showed no Shigella, Salmonella, Campylobacter, Yersinia, or E.coli.    Potassium 3, glucose 117, otherwise BMP normal.    Magnesium 1.8.    Hemoglobin 9.7, MCV 88.6, otherwise CBC normal.    Stool was negative for C.difficile.      ASSESSMENT/PLAN:  CAD.  Stable.    PE.  Continue anticoagulation.    CHF.  Well compensated.    Hypothyroidism.  Well controlled.      Hypokalemia.  Recheck.    Check CBC and BMP.    I have reviewed patient's medical records received at admission/from hospitalization.  CPT CODE: 16109

## 2013-04-06 DIAGNOSIS — E039 Hypothyroidism, unspecified: Secondary | ICD-10-CM | POA: Insufficient documentation

## 2013-04-06 DIAGNOSIS — I509 Heart failure, unspecified: Secondary | ICD-10-CM | POA: Insufficient documentation

## 2013-04-13 NOTE — Progress Notes (Signed)
Patient ID: [name], female DOB: May 06, 1946, 67 y.o. MRN: [id number]         PROGRESS NOTE  DATE: 03/15/2013  FACILITY:  [facility]  LEVEL OF CARE: SNF (31)  Acute Visit  CHIEF COMPLAINT:  Manage anemia of chronic disease and abdominal pain.    HISTORY OF PRESENT ILLNESS: I was requested by the staff to assess the patient regarding above problem(s):  ANEMIA: The anemia has been stable. The patient denies fatigue, melena or hematochezia. The patient is currently not on iron.   On 03/11/2013:  Hemoglobin 10.7, MCV 85.9.    ABDOMINAL PAIN:  New problem.  The patient is complaining of occasional abdominal pain secondary to gas and would like to have something.  She denies any abdominal pain now.    PAST MEDICAL HISTORY : Reviewed.  No changes.  CURRENT MEDICATIONS: Reviewed per [facility]  REVIEW OF SYSTEMS:  GENERAL: no change in appetite, no fatigue, no weight changes, no fever, chills or weakness RESPIRATORY: no cough, SOB, DOE,, wheezing, hemoptysis CARDIAC: no chest pain, edema or palpitations GI: no abdominal pain, diarrhea, constipation, heart burn, nausea or vomiting  PHYSICAL EXAMINATION  GENERAL: no acute distress, moderately obese body habitus NECK: supple, trachea midline, no neck masses, no thyroid tenderness, no thyromegaly RESPIRATORY: breathing is even & unlabored, BS CTAB CARDIAC: RRR, no murmur,no extra heart sounds EDEMA/VARICOSITIES:  +2 bilateral edema  ARTERIAL:  pedal pulses nonpalpable   GI: abdomen soft, normal BS, no masses, no tenderness, no hepatomegaly, no splenomegaly PSYCHIATRIC: the patient is alert & oriented to person, affect & behavior appropriate  ASSESSMENT/PLAN:  Anemia of chronic disease.  Hemoglobin improved.     Abdominal pain.  Secondary to gas.  The pain is intermittent.  Start simethicone 80 mg  t.i.d. p.r.n.    CPT CODE: 40981

## 2013-04-14 ENCOUNTER — Telehealth: Payer: Self-pay | Admitting: Cardiovascular Disease

## 2013-04-14 DIAGNOSIS — R109 Unspecified abdominal pain: Secondary | ICD-10-CM | POA: Insufficient documentation

## 2013-04-14 DIAGNOSIS — D638 Anemia in other chronic diseases classified elsewhere: Secondary | ICD-10-CM | POA: Insufficient documentation

## 2013-04-14 NOTE — Telephone Encounter (Signed)
Since the pt's hospital admission the pt has been unable to write.  The pt will write a few words and then letters get smaller and smaller and she can no longer write. The pt is right handed.  The pt's right wrist was used for cardiac catheterization but the pt did not have any complications with this site and at this time she said that you cannot even see a puncture site. I advised the pt to contact her [facility] for further evaluation. Pt agreed with plan.

## 2013-04-14 NOTE — Telephone Encounter (Signed)
New problem     Patient did not want to disclose any information stated she will repeat only one time when the nurse call back.

## 2013-05-04 ENCOUNTER — Encounter (INDEPENDENT_AMBULATORY_CARE_PROVIDER_SITE_OTHER): Payer: Self-pay

## 2013-05-04 ENCOUNTER — Encounter: Payer: Self-pay | Admitting: Cardiovascular Disease

## 2013-05-04 ENCOUNTER — Ambulatory Visit (INDEPENDENT_AMBULATORY_CARE_PROVIDER_SITE_OTHER): Payer: Medicare Other | Admitting: Cardiovascular Disease

## 2013-05-04 VITALS — BP 116/67 | HR 66 | Ht 62.0 in | Wt 177.0 lb

## 2013-05-04 DIAGNOSIS — I429 Cardiomyopathy, unspecified: Secondary | ICD-10-CM

## 2013-05-04 MED ORDER — METOPROLOL TARTRATE 25 MG PO TABS: 25.0000 mg | ORAL_TABLET | Freq: Two times a day (BID) | ORAL | Status: DC

## 2013-05-04 NOTE — Patient Instructions (Addendum)
Your physician has recommended you make the following change in your medication:  DECREASE Metoprolol - take 25 mg Metoprolol twice (2 times) per day  Your physician wants you to follow-up in: 6 months. You will receive a reminder letter in the mail two months in advance. If you don't receive a letter, please call our office to schedule the follow-up appointment.

## 2013-05-04 NOTE — Progress Notes (Signed)
HPI:  67 year old woman presenting for followup evaluation. The patient had a prolonged hospitalization when she was critically ill from August 27 through 03/08/2013. She was hospitalized with hypovolemic shock and acute renal failure. She developed a non-ST MI in that setting and was found to initially have severe LV dysfunction. She ultimately underwent cardiac catheterization after she recovered from her critical illness. This demonstrated mild to moderate coronary artery disease with normalization of left ventricular systolic function. We felt she probably had Takotsubo's cardiomyopathy. She was diagnosed with a subsegmental pulmonary embolus during her hospitalization and she has been treated with Xarelto.   The patient complains of fatigue and exertional dyspnea. She's had no recurrence of chest pain. She denies leg swelling. She feels like she is making a slow recovery from her hospitalization. She's had no lightheadedness or syncope.  Outpatient Encounter Prescriptions as of 05/04/2013  Medication Sig  . ALPRAZolam (XANAX) 1 MG tablet Take 1 tablet (1 mg total) by mouth 3 (three) times daily as needed for anxiety.  Marland Kitchen atorvastatin (LIPITOR) 10 MG tablet Take 1 tablet (10 mg total) by mouth daily at 6 PM.  . Diphenhydramine-Pseudoephed (BENADRYL ALLERGY/SINUS PO) Take 1 tablet by mouth 2 (two) times daily as needed (for allergies).   . furosemide (LASIX) 40 MG tablet Take 1 tablet (40 mg total) by mouth 2 (two) times daily.  Marland Kitchen HYDROcodone-acetaminophen (NORCO) 7.5-325 MG per tablet Take 1 tablet by mouth every 8 (eight) hours as needed for pain (pain).  Marland Kitchen levothyroxine (SYNTHROID, LEVOTHROID) 25 MCG tablet   . lisinopril (PRINIVIL,ZESTRIL) 2.5 MG tablet Take 1 tablet (2.5 mg total) by mouth daily.  Marland Kitchen loperamide (IMODIUM) 2 MG capsule Take 2 mg by mouth 2 (two) times daily as needed for diarrhea or loose stools.   . metoCLOPramide (REGLAN) 10 MG tablet   . metoprolol (LOPRESSOR) 50 MG tablet  Take 1 tablet (50 mg total) by mouth 2 (two) times daily.  . potassium chloride SA (K-DUR,KLOR-CON) 20 MEQ tablet Take 2 tablets (40 mEq total) by mouth daily.  . pregabalin (LYRICA) 100 MG capsule Take one capsule by mouth twice daily  . Rivaroxaban (XARELTO) 20 MG TABS tablet Take 1 tablet (20 mg total) by mouth daily with supper. Start 03/28/13  . zolpidem (AMBIEN) 5 MG tablet Take 1 tablet (5 mg total) by mouth at bedtime as needed for sleep.  . [DISCONTINUED] feeding supplement (ENSURE) PUDG Take 1 Container by mouth 2 (two) times daily after a meal.  . [DISCONTINUED] ALPRAZolam (XANAX) 1 MG tablet Take one tablet by mouth three times daily as needed for anxiety    No Known Allergies  Past Medical History  Diagnosis Date  . IBS (irritable bowel syndrome)   . Diverticulitis   . Hypertension   . Fibromyalgia   . Systolic dysfunction, left ventricle     a. 01/2013 Echo: Sev HK of mid-dist antsept, ant, apical walls; mid-dist lat/inflat/inf/infsept AK, Gr 3 DD, Mild MR. => recovered EF at cath 9/14 (Tako-Tsubo CM)  . NSTEMI (non-ST elevated myocardial infarction)     a. 01/2013  . PE (pulmonary embolism)     a. 01/2013 CTA Chest: small segmental PE to the superior segment of RLL.  . Takotsubo cardiomyopathy     a. in setting of gastroenteritis c/b hypovolemic shock and ARF (9/14) => EF recovered at cath  . CAD (coronary artery disease)     a. LHC 03/07/13: LAD 50%, mid circumflex 30%, RCA with luminal irregularities, EF 55-65%  .  Hyperlipidemia     ROS: Negative except as per HPI  BP 116/67  Pulse 66  Ht 5\' 2"  (1.575 m)  Wt 177 lb (80.287 kg)  BMI 32.37 kg/m2  PHYSICAL EXAM: Pt is alert and oriented, NAD HEENT: normal Neck: JVP - normal, carotids 2+= without bruits Lungs: CTA bilaterally CV: RRR without murmur or gallop Abd: soft, NT, Positive BS, no hepatomegaly Ext: no C/C/E, distal pulses intact and equal Skin: warm/dry no rash  ASSESSMENT AND PLAN: 1. Cardiomyopathy,  stress-induced. Normalization of LV function noted. With her fatigue, I am going to decrease her metoprolol to 25 mg twice daily to try to help with her generalized fatigue. Note LVEF is now normal and she has no angina.   2. Hyperlipidemia - on atorvastatin.  3. Pulmonary embolus - treated by her PCP. Taking xarelto without problems.   Tonny Bollman 05/06/2013 10:11 PM

## 2013-05-06 ENCOUNTER — Encounter: Payer: Self-pay | Admitting: Cardiovascular Disease

## 2013-12-08 ENCOUNTER — Other Ambulatory Visit: Payer: Self-pay | Admitting: Cardiovascular Disease

## 2014-06-08 ENCOUNTER — Encounter (HOSPITAL_COMMUNITY): Payer: Self-pay | Admitting: Cardiovascular Disease

## 2014-12-18 ENCOUNTER — Emergency Department (HOSPITAL_BASED_OUTPATIENT_CLINIC_OR_DEPARTMENT_OTHER)
Admit: 2014-12-18 | Discharge: 2014-12-18 | Disposition: A | Discharge status: Home / Self Care | Payer: Medicare Other | Attending: Urology | Admitting: Urology

## 2014-12-18 ENCOUNTER — Emergency Department (HOSPITAL_COMMUNITY)
Admission: EM | Admit: 2014-12-18 | Discharge: 2014-12-18 | Disposition: A | Discharge status: Home / Self Care | Payer: Medicare Other | Attending: Emergency Medicine | Admitting: Emergency Medicine

## 2014-12-18 ENCOUNTER — Emergency Department (HOSPITAL_COMMUNITY): Payer: Medicare Other

## 2014-12-18 ENCOUNTER — Encounter (HOSPITAL_COMMUNITY): Payer: Self-pay | Admitting: Emergency Medicine

## 2014-12-18 DIAGNOSIS — I10 Essential (primary) hypertension: Secondary | ICD-10-CM | POA: Diagnosis not present

## 2014-12-18 DIAGNOSIS — Z8719 Personal history of other diseases of the digestive system: Secondary | ICD-10-CM | POA: Diagnosis not present

## 2014-12-18 DIAGNOSIS — Z86711 Personal history of pulmonary embolism: Secondary | ICD-10-CM | POA: Insufficient documentation

## 2014-12-18 DIAGNOSIS — F329 Major depressive disorder, single episode, unspecified: Secondary | ICD-10-CM | POA: Insufficient documentation

## 2014-12-18 DIAGNOSIS — M25562 Pain in left knee: Secondary | ICD-10-CM | POA: Diagnosis not present

## 2014-12-18 DIAGNOSIS — I509 Heart failure, unspecified: Secondary | ICD-10-CM | POA: Diagnosis not present

## 2014-12-18 DIAGNOSIS — Z7901 Long term (current) use of anticoagulants: Secondary | ICD-10-CM | POA: Diagnosis not present

## 2014-12-18 DIAGNOSIS — F419 Anxiety disorder, unspecified: Secondary | ICD-10-CM | POA: Diagnosis not present

## 2014-12-18 DIAGNOSIS — M79609 Pain in unspecified limb: Secondary | ICD-10-CM

## 2014-12-18 DIAGNOSIS — E785 Hyperlipidemia, unspecified: Secondary | ICD-10-CM | POA: Insufficient documentation

## 2014-12-18 DIAGNOSIS — I252 Old myocardial infarction: Secondary | ICD-10-CM | POA: Insufficient documentation

## 2014-12-18 DIAGNOSIS — Z87891 Personal history of nicotine dependence: Secondary | ICD-10-CM | POA: Insufficient documentation

## 2014-12-18 DIAGNOSIS — M79605 Pain in left leg: Secondary | ICD-10-CM | POA: Diagnosis present

## 2014-12-18 DIAGNOSIS — I2510 Atherosclerotic heart disease of native coronary artery without angina pectoris: Secondary | ICD-10-CM | POA: Insufficient documentation

## 2014-12-18 DIAGNOSIS — M7122 Synovial cyst of popliteal space [Baker], left knee: Secondary | ICD-10-CM | POA: Insufficient documentation

## 2014-12-18 DIAGNOSIS — Z79899 Other long term (current) drug therapy: Secondary | ICD-10-CM | POA: Insufficient documentation

## 2014-12-18 LAB — CBC
HCT: 36 % (ref 36.0–46.0)
Hemoglobin: 10.9 g/dL — ABNORMAL LOW (ref 12.0–15.0)
MCH: 23.7 pg — AB (ref 26.0–34.0)
MCHC: 30.3 g/dL (ref 30.0–36.0)
MCV: 78.4 fL (ref 78.0–100.0)
PLATELETS: 422 10*3/uL — AB (ref 150–400)
RBC: 4.59 MIL/uL (ref 3.87–5.11)
RDW: 15.1 % (ref 11.5–15.5)
WBC: 7.4 10*3/uL (ref 4.0–10.5)

## 2014-12-18 LAB — BASIC METABOLIC PANEL
ANION GAP: 8 (ref 5–15)
BUN: 6 mg/dL (ref 6–20)
CALCIUM: 8.9 mg/dL (ref 8.9–10.3)
CHLORIDE: 103 mmol/L (ref 101–111)
CO2: 28 mmol/L (ref 22–32)
Creatinine, Ser: 0.66 mg/dL (ref 0.44–1.00)
GFR calc Af Amer: >60 mL/min (ref >60)
GFR calc non Af Amer: >60 mL/min (ref >60)
Glucose, Bld: 165 mg/dL — ABNORMAL HIGH (ref 65–99)
Potassium: 3.3 mmol/L — ABNORMAL LOW (ref 3.5–5.1)
SODIUM: 139 mmol/L (ref 135–145)

## 2014-12-18 MED ORDER — MORPHINE SULFATE 2 MG/ML IJ SOLN
2.0000 mg | Freq: Once | INTRAMUSCULAR | Status: AC
Start: 2014-12-18 — End: 2014-12-18
  Administered 2014-12-18: 2 mg via INTRAVENOUS
  Filled 2014-12-18: qty 1

## 2014-12-18 MED ORDER — MORPHINE SULFATE 4 MG/ML IJ SOLN
4.0000 mg | Freq: Once | INTRAMUSCULAR | Status: AC
  Administered 2014-12-18: 4 mg via INTRAVENOUS
  Filled 2014-12-18: qty 1

## 2014-12-18 NOTE — Discharge Instructions (Signed)
Arthritis, Nonspecific Arthritis is inflammation of a joint. This usually means pain, redness, warmth or swelling are present. One or more joints may be involved. There are a number of types of arthritis. Your caregiver may not be able to tell what type of arthritis you have right away. CAUSES  The most common cause of arthritis is the wear and tear on the joint (osteoarthritis). This causes damage to the cartilage, which can break down over time. The knees, hips, back and neck are most often affected by this type of arthritis. Other types of arthritis and common causes of joint pain include:  Sprains and other injuries near the joint. Sometimes minor sprains and injuries cause pain and swelling that develop hours later.  Rheumatoid arthritis. This affects hands, feet and knees. It usually affects both sides of your body at the same time. It is often associated with chronic ailments, fever, weight loss and general weakness.  Crystal arthritis. Gout and pseudo gout can cause occasional acute severe pain, redness and swelling in the foot, ankle, or knee.  Infectious arthritis. Bacteria can get into a joint through a break in overlying skin. This can cause infection of the joint. Bacteria and viruses can also spread through the blood and affect your joints.  Drug, infectious and allergy reactions. Sometimes joints can become mildly painful and slightly swollen with these types of illnesses. SYMPTOMS   Pain is the main symptom.  Your joint or joints can also be red, swollen and warm or hot to the touch.  You may have a fever with certain types of arthritis, or even feel overall ill.  The joint with arthritis will hurt with movement. Stiffness is present with some types of arthritis. DIAGNOSIS  Your caregiver will suspect arthritis based on your description of your symptoms and on your exam. Testing may be needed to find the type of arthritis:  Blood and sometimes urine tests.  X-ray tests  and sometimes CT or MRI scans.  Removal of fluid from the joint (arthrocentesis) is done to check for bacteria, crystals or other causes. Your caregiver (or a specialist) will numb the area over the joint with a local anesthetic, and use a needle to remove joint fluid for examination. This procedure is only minimally uncomfortable.  Even with these tests, your caregiver may not be able to tell what kind of arthritis you have. Consultation with a specialist (rheumatologist) may be helpful. TREATMENT  Your caregiver will discuss with you treatment specific to your type of arthritis. If the specific type cannot be determined, then the following general recommendations may apply. Treatment of severe joint pain includes:  Rest.  Elevation.  Anti-inflammatory medication (for example, ibuprofen) may be prescribed. Avoiding activities that cause increased pain.  Only take over-the-counter or prescription medicines for pain and discomfort as recommended by your caregiver.  Cold packs over an inflamed joint may be used for 10 to 15 minutes every hour. Hot packs sometimes feel better, but do not use overnight. Do not use hot packs if you are diabetic without your caregiver's permission.  A cortisone shot into arthritic joints may help reduce pain and swelling.  Any acute arthritis that gets worse over the next 1 to 2 days needs to be looked at to be sure there is no joint infection. Long-term arthritis treatment involves modifying activities and lifestyle to reduce joint stress jarring. This can include weight loss. Also, exercise is needed to nourish the joint cartilage and remove waste. This helps keep the muscles   around the joint strong. HOME CARE INSTRUCTIONS   Do not take aspirin to relieve pain if gout is suspected. This elevates uric acid levels.  Only take over-the-counter or prescription medicines for pain, discomfort or fever as directed by your caregiver.  Rest the joint as much as  possible.  If your joint is swollen, keep it elevated.  Use crutches if the painful joint is in your leg.  Drinking plenty of fluids may help for certain types of arthritis.  Follow your caregiver's dietary instructions.  Try low-impact exercise such as:  Swimming.  Water aerobics.  Biking.  Walking.  Morning stiffness is often relieved by a warm shower.  Put your joints through regular range-of-motion. SEEK MEDICAL CARE IF:   You do not feel better in 24 hours or are getting worse.  You have side effects to medications, or are not getting better with treatment. SEEK IMMEDIATE MEDICAL CARE IF:   You have a fever.  You develop severe joint pain, swelling or redness.  Many joints are involved and become painful and swollen.  There is severe back pain and/or leg weakness.  You have loss of bowel or bladder control. Document Released: 07/24/2004 Document Revised: 09/08/2011 Document Reviewed: 08/09/2008 ExitCare Patient Information 2015 [employer]. This information is not intended to replace advice given to you by your health care provider. Make sure you discuss any questions you have with your health care provider. Baker Cyst A Baker cyst is a sac-like structure that forms in the back of the knee. It is filled with the same fluid that is located in your knee. This fluid lubricates the bones and cartilage of the knee and allows them to move over each other more easily. CAUSES  When the knee becomes injured or inflamed, increased fluid forms in the knee. When this happens, the joint lining is pushed out behind the knee and forms the Baker cyst. This cyst may also be caused by inflammation from arthritic conditions and infections. SIGNS AND SYMPTOMS  A Baker cyst usually has no symptoms. When the cyst is substantially enlarged:  You may feel pressure behind the knee, stiffness in the knee, or a mass in the area behind the knee.  You may develop pain, redness, and  swelling in the calf. This can suggest a blood clot and requires evaluation by your health care provider. DIAGNOSIS  A Baker cyst is most often found during an ultrasound exam. This exam may have been performed for other reasons, and the cyst was found incidentally. Sometimes an MRI is used. This picks up other problems within a joint that an ultrasound exam may not. If the Baker cyst developed immediately after an injury, X-ray exams may be used to diagnose the cyst. TREATMENT  The treatment depends on the cause of the cyst. Anti-inflammatory medicines and rest often will be prescribed. If the cyst is caused by a bacterial infection, antibiotic medicines may be prescribed.  HOME CARE INSTRUCTIONS   If the cyst was caused by an injury, for the first 24 hours, keep the injured leg elevated on 2 pillows while lying down.  For the first 24 hours while you are awake, apply ice to the injured area:  Put ice in a plastic bag.  Place a towel between your skin and the bag.  Leave the ice on for 20 minutes, 2-3 times a day.  Only take over-the-counter or prescription medicines for pain, discomfort, or fever as directed by your health care provider.  Only take antibiotic   medicine as directed. Make sure to finish it even if you start to feel better. MAKE SURE YOU:   Understand these instructions.  Will watch your condition.  Will get help right away if you are not doing well or get worse. Document Released: 06/16/2005 Document Revised: 04/06/2013 Document Reviewed: 01/26/2013 Exit[employer] Patient Information 2015 [employer]. This information is not intended to replace advice given to you by your health care provider. Make sure you discuss any questions you have with your health care provider.  

## 2014-12-18 NOTE — ED Notes (Signed)
Pt here from home with c/o left leg pain that has been ongoing for 3 weeks , pain is mostly in calf , knee is warm to touch pedal pulses present

## 2014-12-18 NOTE — Progress Notes (Signed)
VASCULAR LAB PRELIMINARY  PRELIMINARY  PRELIMINARY  PRELIMINARY  Bilateral lower extremity venous Dopplers completed.    Preliminary report:  There is no DVT or SVT noted in the bilateral lower extremities.  There is an intact Baker's cyst noted in the left popliteal fossa.   Rakan Soffer, RVT 12/18/2014, 1:35 PM

## 2014-12-18 NOTE — ED Provider Notes (Signed)
CSN: [id number]     Arrival date & time 12/18/14  1140 History   First MD Initiated Contact with Patient 12/18/14 1202     Chief Complaint  Patient presents with  . Leg Pain     (Consider location/radiation/quality/duration/timing/severity/associated sxs/prior Treatment) Patient is a 69 y.o. female presenting with leg pain.  Leg Pain Location:  Leg Time since incident:  3 weeks Injury: no   Leg location:  L leg and L lower leg Pain details:    Quality:  Aching and burning   Radiates to:  Does not radiate   Severity:  Moderate   Onset quality:  Gradual   Duration:  3 weeks   Timing:  Constant   Progression:  Worsening Chronicity:  New Dislocation: no   Relieved by:  Nothing Worsened by:  Bearing weight and flexion Ineffective treatments: Lortab. Associated symptoms: stiffness   Associated symptoms: no back pain, no decreased ROM, no fatigue, no fever, no itching, no muscle weakness, no neck pain, no numbness, no swelling and no tingling   Risk factors comment:  Prior Pulm Embolism   Past Medical History  Diagnosis Date  . IBS (irritable bowel syndrome)   . Diverticulitis   . Hypertension   . Fibromyalgia   . Systolic dysfunction, left ventricle     a. 01/2013 Echo: Sev HK of mid-dist antsept, ant, apical walls; mid-dist lat/inflat/inf/infsept AK, Gr 3 DD, Mild MR. => recovered EF at cath 9/14 (Tako-Tsubo CM)  . NSTEMI (non-ST elevated myocardial infarction)     a. 01/2013  . PE (pulmonary embolism)     a. 01/2013 CTA Chest: small segmental PE to the superior segment of RLL.  . Takotsubo cardiomyopathy     a. in setting of gastroenteritis c/b hypovolemic shock and ARF (9/14) => EF recovered at cath  . CAD (coronary artery disease)     a. LHC 03/07/13: LAD 50%, mid circumflex 30%, RCA with luminal irregularities, EF 55-65%  . Hyperlipidemia    Past Surgical History  Procedure Laterality Date  . Abdominal hysterectomy    . Stomach surgery    . Left heart catheterization  with coronary angiogram N/A 03/07/2013    Procedure: LEFT HEART CATHETERIZATION WITH CORONARY ANGIOGRAM;  Surgeon: Tonny Bollman, MD;  Location: [facility] CATH LAB;  Service: Cardiovascular;  Laterality: N/A;   Family History  Problem Relation Age of Onset  . Other Father     alive and well @ 39  . Other Paternal Grandmother     lived into her hundreds  . Other Mother     died of substance abuse issues.   History  Substance Use Topics  . Smoking status: Former Games developer  . Smokeless tobacco: Never Used  . Alcohol Use: 0.6 oz/week    1 Cans of beer per week   OB History    No data available     Review of Systems  Constitutional: Negative for fever, chills, appetite change and fatigue.  HENT: Negative for congestion, ear pain, facial swelling, mouth sores and sore throat.   Eyes: Negative for visual disturbance.  Respiratory: Negative for cough, chest tightness and shortness of breath.   Cardiovascular: Negative for chest pain and palpitations.  Gastrointestinal: Negative for nausea, vomiting, abdominal pain, diarrhea and blood in stool.  Endocrine: Negative for cold intolerance and heat intolerance.  Genitourinary: Negative for frequency, decreased urine volume and difficulty urinating.  Musculoskeletal: Positive for stiffness. Negative for back pain, neck pain and neck stiffness.  Skin: Negative for itching  and rash.  Neurological: Negative for dizziness, weakness, light-headedness and headaches.  All other systems reviewed and are negative.     Allergies  Review of patient's allergies indicates no known allergies.  Home Medications   Prior to Admission medications   Medication Sig Start Date End Date Taking? Authorizing Provider  ALPRAZolam Prudy Feeler) 1 MG tablet Take 1 tablet (1 mg total) by mouth 3 (three) times daily as needed for anxiety. 03/08/13  Yes Catarina Hartshorn, MD  atorvastatin (LIPITOR) 10 MG tablet Take 1 tablet (10 mg total) by mouth daily at 6 PM. 03/08/13  Yes Catarina Hartshorn, MD   furosemide (LASIX) 40 MG tablet Take 1 tablet (40 mg total) by mouth 2 (two) times daily. 03/08/13  Yes Catarina Hartshorn, MD  HYDROcodone-acetaminophen (NORCO) 7.5-325 MG per tablet Take 1 tablet by mouth every 8 (eight) hours as needed for pain (pain). 03/08/13  Yes Mahima Pandey, MD  levothyroxine (SYNTHROID, LEVOTHROID) 100 MCG tablet Take 100 mcg by mouth daily. 12/12/14  Yes Historical Provider, MD  levothyroxine (SYNTHROID, LEVOTHROID) 25 MCG tablet Take 25 mcg by mouth daily before breakfast.  04/11/13  Yes Historical Provider, MD  loperamide (IMODIUM) 2 MG capsule Take 2 mg by mouth 2 (two) times daily as needed for diarrhea or loose stools.    Yes Historical Provider, MD  loratadine (CLARITIN) 10 MG tablet Take 10 mg by mouth daily.   Yes Historical Provider, MD  metoCLOPramide (REGLAN) 10 MG tablet 10 mg 3 (three) times daily before meals.  04/11/13  Yes Historical Provider, MD  metoprolol tartrate (LOPRESSOR) 25 MG tablet take 1 tablet by mouth twice a day   Yes Tonny Bollman, MD  Multiple Vitamins-Minerals (MULTIVITAMIN WITH MINERALS) tablet Take 1 tablet by mouth daily.   Yes Historical Provider, MD  omega-3 acid ethyl esters (LOVAZA) 1 G capsule Take 1 g by mouth daily.   Yes Historical Provider, MD  PARoxetine (PAXIL) 10 MG tablet Take 10 mg by mouth at bedtime. 12/07/14  Yes Historical Provider, MD  potassium chloride SA (K-DUR,KLOR-CON) 20 MEQ tablet Take 2 tablets (40 mEq total) by mouth daily. 03/08/13  Yes Catarina Hartshorn, MD  pregabalin (LYRICA) 100 MG capsule Take one capsule by mouth twice daily 03/08/13  Yes Mahima Glade Lloyd, MD  Rivaroxaban (XARELTO) 20 MG TABS tablet Take 1 tablet (20 mg total) by mouth daily with supper. Start 03/28/13 03/21/13  Yes Catarina Hartshorn, MD  zolpidem (AMBIEN) 5 MG tablet Take 1 tablet (5 mg total) by mouth at bedtime as needed for sleep. Patient taking differently: Take 10 mg by mouth at bedtime as needed for sleep.  03/18/13  Yes Tiffany L Reed, DO  Diphenhydramine-Pseudoephed  (BENADRYL ALLERGY/SINUS PO) Take 1 tablet by mouth 2 (two) times daily as needed (for allergies).     Historical Provider, MD  lisinopril (PRINIVIL,ZESTRIL) 2.5 MG tablet Take 1 tablet (2.5 mg total) by mouth daily. 03/08/13   Catarina Hartshorn, MD   BP 142/75 mmHg  Pulse 82  Temp(Src) 97.6 F (36.4 C) (Oral)  Resp 18  SpO2 99% Physical Exam  Constitutional: She is oriented to person, place, and time. She appears well-developed and well-nourished. No distress.  HENT:  Head: Normocephalic and atraumatic.  Right Ear: External ear normal.  Left Ear: External ear normal.  Nose: Nose normal.  Eyes: Conjunctivae and EOM are normal. Pupils are equal, round, and reactive to light. Right eye exhibits no discharge. Left eye exhibits no discharge. No scleral icterus.  Neck: Normal range of motion.  Neck supple.  Cardiovascular: Normal rate, regular rhythm and normal heart sounds.  Exam reveals no gallop and no friction rub.   No murmur heard. Pulmonary/Chest: Effort normal and breath sounds normal. No stridor. No respiratory distress. She has no wheezes.  Abdominal: Soft. She exhibits no distension. There is no tenderness.  Musculoskeletal: She exhibits no edema.       Left knee: She exhibits no effusion, no ecchymosis, no deformity and no erythema. Tenderness found. Medial joint line tenderness noted.  Neurological: She is alert and oriented to person, place, and time.  Skin: Skin is warm and dry. No rash noted. She is not diaphoretic. No erythema.  Psychiatric: She has a normal mood and affect.    ED Course  Procedures (including critical care time) Labs Review Labs Reviewed  CBC - Abnormal; Notable for the following:    Hemoglobin 10.9 (*)    MCH 23.7 (*)    Platelets 422 (*)    All other components within normal limits  BASIC METABOLIC PANEL - Abnormal; Notable for the following:    Potassium 3.3 (*)    Glucose, Bld 165 (*)    All other components within normal limits    Imaging Review Dg  Knee Complete 4 Views Left  12/18/2014   CLINICAL DATA:  Severe left knee pain for 3 weeks, now radiating to the foot. Knee pain is located both anterior and posterior.  EXAM: LEFT KNEE - COMPLETE 4+ VIEW  COMPARISON:  None.  FINDINGS: No acute fracture, dislocation, or knee joint effusion is identified. There is mild patellar marginal osteophytosis with evidence of patellofemoral joint space narrowing. There is at most minimal medial compartment joint space narrowing. No lytic or blastic osseous lesion or soft tissue abnormality is identified.  IMPRESSION: 1. No acute osseous abnormality identified. 2. Patellofemoral osteoarthrosis.   Electronically Signed   By: Sebastian Ache   On: 12/18/2014 13:39     EKG Interpretation None      MDM   69 year old female with a history of anxiety, depression, fibromyalgia, hypertension, CHF, pulmonary embolism currently on Xarelto who presents to ED with 3 weeks of left knee and lower extremity pain that has progressively worsened. She denied any recent falls or trauma. Presently his chin exam as above. Plain film with mild patellar osteophytosis, with no bony lesions or fractures. No show any DVT study without DVT however did show a left Baker cyst. Patient provided with pain medication in the ED with moderate improvement. Patient able to ambulate prior to discharge. Patient in good condition and stable for discharge with strict return precautions. Patient's follow-up with orthopedic surgery for likely osteoarthritis and for further evaluation of her Baker cyst.  Patient seen in conjunction with Dr. Fredderick Phenix.  Final diagnoses:  Baker cyst, left  Left knee pain        Drema Pry, MD 12/18/14 1603  Rolan Bucco, MD 12/19/14 1202

## 2017-04-17 IMAGING — DX DG KNEE COMPLETE 4+V*L*
4 series · 4 of 4 positions shown · non-contrast
Comparison: None.

CLINICAL DATA: Severe left knee pain for 3 weeks, now radiating to
the foot. Knee pain is located both anterior and posterior.

EXAM:
LEFT KNEE - COMPLETE 4+ VIEW

[knee ap]
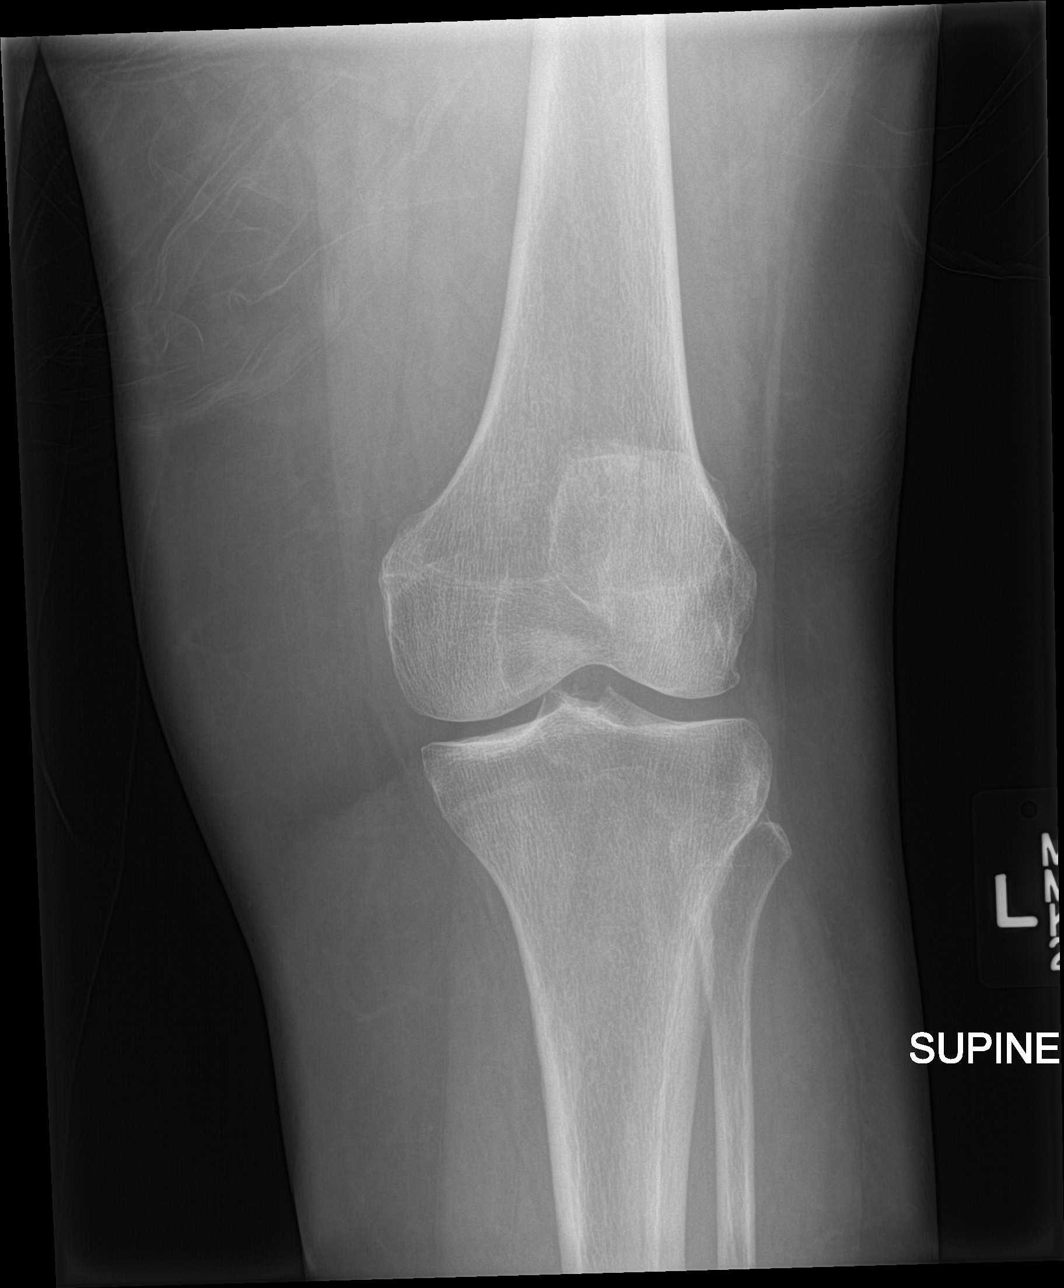

[knee lat]
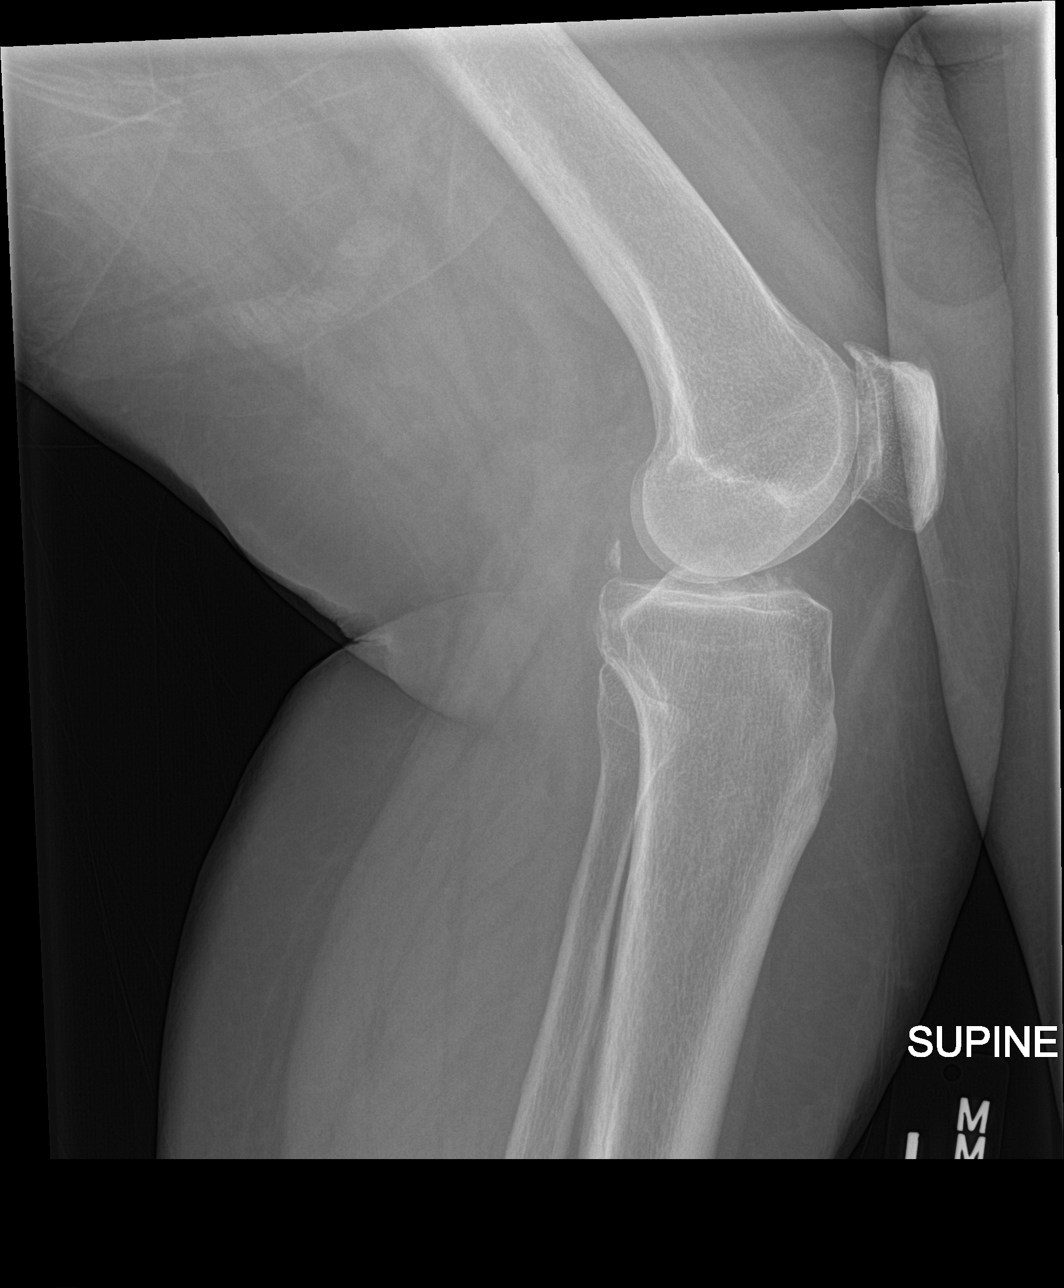

[knee obl (1 of 2)]
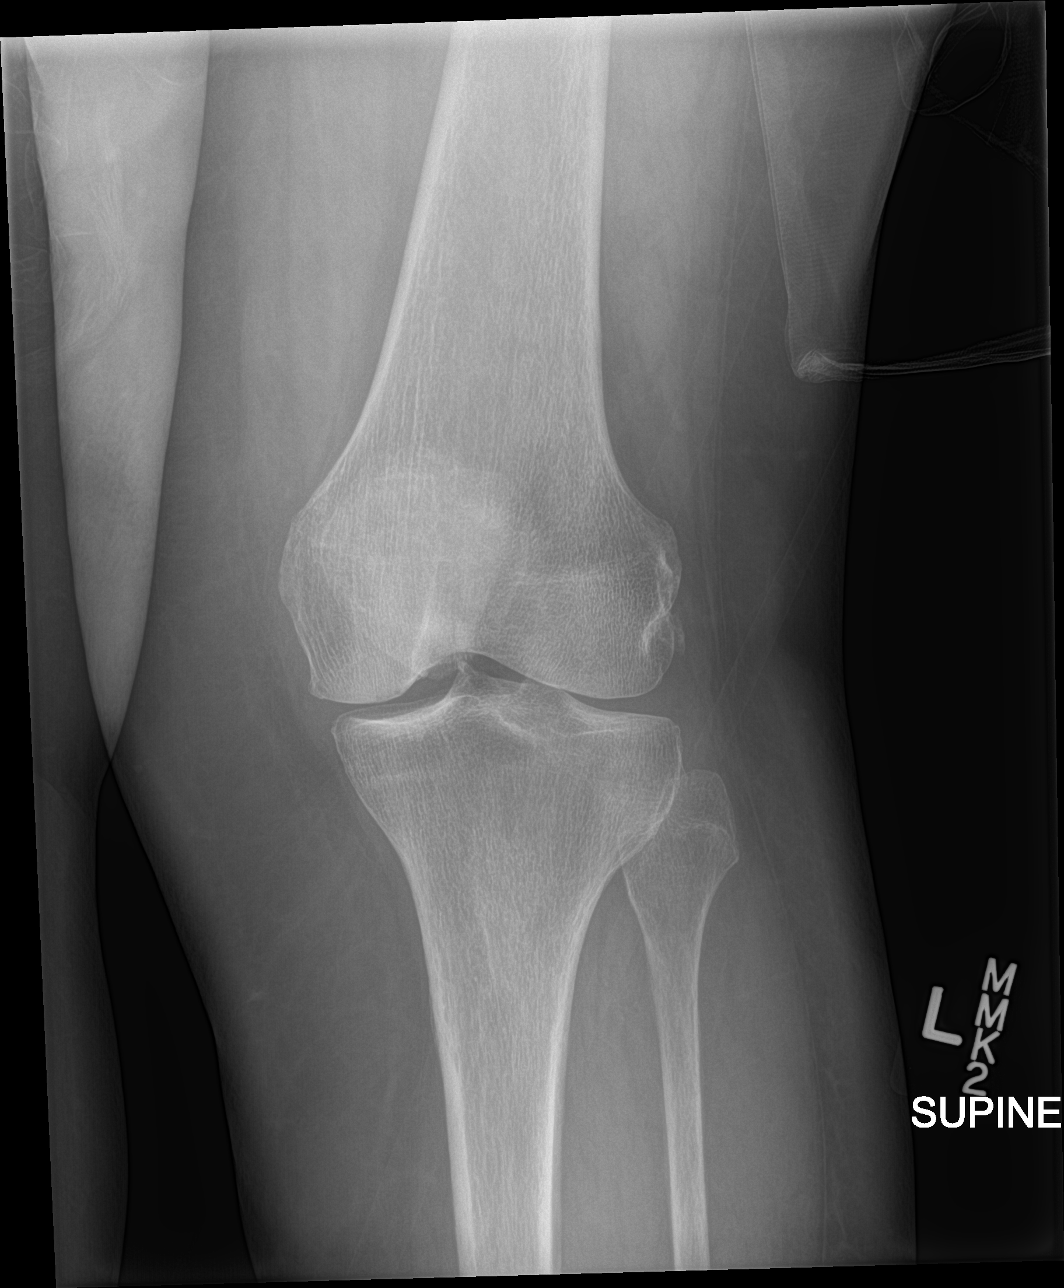

[knee obl (2 of 2)]
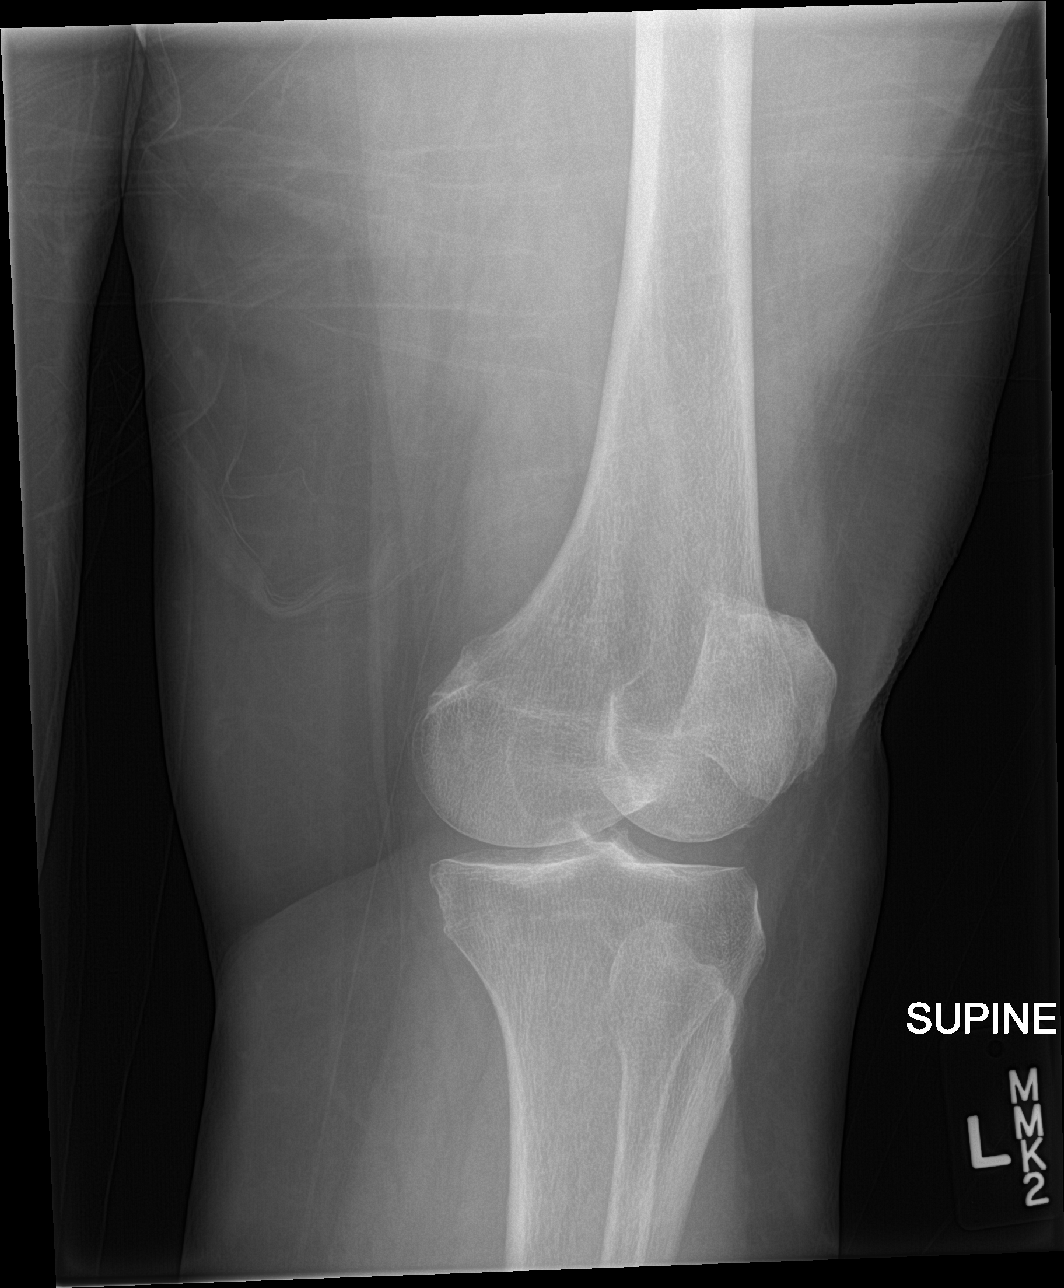

[4 of 4 positions shown; findings below may reference images not displayed]

FINDINGS: No acute fracture, dislocation, or knee joint effusion is
identified. There is mild patellar marginal osteophytosis with
evidence of patellofemoral joint space narrowing. There is at most
minimal medial compartment joint space narrowing. No lytic or
blastic osseous lesion or soft tissue abnormality is identified.
IMPRESSION: 1. No acute osseous abnormality identified.
2. Patellofemoral osteoarthrosis.
